# Patient Record
Sex: Female | Born: 1970 | Hispanic: Yes | Marital: Married | Smoking: Former smoker
Health system: Southern US, Community
[De-identification: ages and names within clinical notes are randomized; demographics above are authoritative.]

## PROBLEM LIST (undated history)

## (undated) DIAGNOSIS — C439 Malignant melanoma of skin, unspecified: Secondary | ICD-10-CM

## (undated) DIAGNOSIS — R51 Headache: Secondary | ICD-10-CM

## (undated) DIAGNOSIS — Z8619 Personal history of other infectious and parasitic diseases: Secondary | ICD-10-CM

## (undated) DIAGNOSIS — J301 Allergic rhinitis due to pollen: Secondary | ICD-10-CM

## (undated) DIAGNOSIS — N83209 Unspecified ovarian cyst, unspecified side: Secondary | ICD-10-CM

## (undated) DIAGNOSIS — R519 Headache, unspecified: Secondary | ICD-10-CM

## (undated) DIAGNOSIS — Z973 Presence of spectacles and contact lenses: Secondary | ICD-10-CM

## (undated) DIAGNOSIS — R011 Cardiac murmur, unspecified: Secondary | ICD-10-CM

## (undated) DIAGNOSIS — N9089 Other specified noninflammatory disorders of vulva and perineum: Secondary | ICD-10-CM

## (undated) DIAGNOSIS — D071 Carcinoma in situ of vulva: Secondary | ICD-10-CM

## (undated) DIAGNOSIS — E7800 Pure hypercholesterolemia, unspecified: Secondary | ICD-10-CM

## (undated) DIAGNOSIS — E079 Disorder of thyroid, unspecified: Secondary | ICD-10-CM

## (undated) DIAGNOSIS — I219 Acute myocardial infarction, unspecified: Secondary | ICD-10-CM

## (undated) DIAGNOSIS — E039 Hypothyroidism, unspecified: Secondary | ICD-10-CM

## (undated) DIAGNOSIS — M5412 Radiculopathy, cervical region: Secondary | ICD-10-CM

## (undated) DIAGNOSIS — Z720 Tobacco use: Secondary | ICD-10-CM

## (undated) HISTORY — PX: VULVAR LESION REMOVAL: SHX5391

## (undated) HISTORY — DX: Allergic rhinitis due to pollen: J30.1

## (undated) HISTORY — DX: Unspecified ovarian cyst, unspecified side: N83.209

## (undated) HISTORY — DX: Hypothyroidism, unspecified: E03.9

## (undated) HISTORY — PX: TUBAL LIGATION: SHX77

## (undated) HISTORY — PX: BREAST BIOPSY: SHX20

## (undated) HISTORY — DX: Carcinoma in situ of vulva: D07.1

## (undated) HISTORY — DX: Cardiac murmur, unspecified: R01.1

## (undated) HISTORY — DX: Tobacco use: Z72.0

## (undated) HISTORY — DX: Pure hypercholesterolemia, unspecified: E78.00

## (undated) HISTORY — DX: Other specified noninflammatory disorders of vulva and perineum: N90.89

## (undated) HISTORY — DX: Disorder of thyroid, unspecified: E07.9

## (undated) HISTORY — DX: Acute myocardial infarction, unspecified: I21.9

## (undated) HISTORY — DX: Personal history of other infectious and parasitic diseases: Z86.19

---

## 1995-08-08 DIAGNOSIS — I219 Acute myocardial infarction, unspecified: Secondary | ICD-10-CM

## 1995-08-08 HISTORY — DX: Acute myocardial infarction, unspecified: I21.9

## 1999-04-02 ENCOUNTER — Emergency Department (HOSPITAL_COMMUNITY): Admission: EM | Admit: 1999-04-02 | Discharge: 1999-04-02 | Payer: Self-pay | Admitting: Emergency Medicine

## 2002-08-07 HISTORY — PX: TUBAL LIGATION: SHX77

## 2008-08-07 DIAGNOSIS — C439 Malignant melanoma of skin, unspecified: Secondary | ICD-10-CM

## 2008-08-07 HISTORY — PX: ANKLE SURGERY: SHX546

## 2008-08-07 HISTORY — DX: Malignant melanoma of skin, unspecified: C43.9

## 2009-08-07 ENCOUNTER — Ambulatory Visit: Payer: Self-pay | Admitting: Gynecologic Oncology

## 2009-08-31 ENCOUNTER — Ambulatory Visit: Payer: Self-pay | Admitting: Gynecologic Oncology

## 2009-09-07 ENCOUNTER — Ambulatory Visit: Payer: Self-pay | Admitting: Gynecologic Oncology

## 2009-09-07 ENCOUNTER — Encounter: Payer: Self-pay | Admitting: Cardiovascular Disease

## 2009-09-07 LAB — CONVERTED CEMR LAB
BUN: 13 mg/dL
Creatinine, Ser: 1.09 mg/dL

## 2009-09-09 ENCOUNTER — Encounter: Payer: Self-pay | Admitting: Cardiovascular Disease

## 2009-09-10 ENCOUNTER — Ambulatory Visit: Payer: Self-pay | Admitting: Cardiovascular Disease

## 2009-09-10 DIAGNOSIS — R9431 Abnormal electrocardiogram [ECG] [EKG]: Secondary | ICD-10-CM

## 2009-09-13 ENCOUNTER — Encounter: Payer: Self-pay | Admitting: Internal Medicine

## 2009-09-14 ENCOUNTER — Ambulatory Visit: Payer: Self-pay | Admitting: Gynecologic Oncology

## 2009-09-21 ENCOUNTER — Ambulatory Visit: Payer: Self-pay | Admitting: Gynecologic Oncology

## 2009-10-05 ENCOUNTER — Ambulatory Visit: Payer: Self-pay | Admitting: Gynecologic Oncology

## 2009-10-12 ENCOUNTER — Ambulatory Visit: Payer: Self-pay | Admitting: Gynecologic Oncology

## 2009-11-05 ENCOUNTER — Ambulatory Visit: Payer: Self-pay | Admitting: Gynecologic Oncology

## 2009-11-30 ENCOUNTER — Ambulatory Visit: Payer: Self-pay | Admitting: Gynecologic Oncology

## 2010-02-08 LAB — HM PAP SMEAR

## 2010-03-07 ENCOUNTER — Ambulatory Visit: Payer: Self-pay | Admitting: Gynecologic Oncology

## 2010-03-29 ENCOUNTER — Ambulatory Visit: Payer: Self-pay | Admitting: Gynecologic Oncology

## 2010-04-04 LAB — PATHOLOGY REPORT

## 2010-04-07 ENCOUNTER — Ambulatory Visit: Payer: Self-pay | Admitting: Gynecologic Oncology

## 2010-04-12 ENCOUNTER — Ambulatory Visit: Payer: Self-pay | Admitting: Gynecologic Oncology

## 2010-05-07 ENCOUNTER — Ambulatory Visit: Payer: Self-pay | Admitting: Gynecologic Oncology

## 2010-05-17 ENCOUNTER — Ambulatory Visit: Payer: Self-pay | Admitting: Radiation Oncology

## 2010-06-07 ENCOUNTER — Ambulatory Visit: Payer: Self-pay | Admitting: Gynecologic Oncology

## 2010-08-02 ENCOUNTER — Ambulatory Visit: Payer: Self-pay | Admitting: Gynecologic Oncology

## 2010-08-07 ENCOUNTER — Ambulatory Visit: Payer: Self-pay | Admitting: Gynecologic Oncology

## 2010-09-06 NOTE — Letter (Signed)
Summary: Clearance Letter  Architectural technologist at [facility] [address]   [place], [place] [address]   Phone: [phone]  Fax: [phone]    September 10, 2009  Re:     Marcia [name] Address:   [address]     [place], [place]  [address] DOB:     1971/04/06 MRN:     [id number]   Dear Marcia [name] and Marcia [name]:  Marcia [name] was seen today for cardiac clearance for her pending surgery.  She is a low risk patient and is cleared from a cardiology standpoint to proceed with her surgery.  Should you have any questions or need additional information, please feel free to contact our office at [phone].   Sincerely,     Dossie Arbour, MD

## 2010-09-06 NOTE — Miscellaneous (Signed)
  Clinical Lists Changes  Observations: Added new observation of EGFR NOT AFA: 60 mL/min/1.38m2 (09/07/2009 16:54) Added new observation of EGFR IF AFA: >60 mL/min/1.9m2 (09/07/2009 16:54) Added new observation of CALCIUM: 9.0 mg/dL (40/98/1191 47:82) Added new observation of CREATININE: 1.09 mg/dL (95/62/1308 65:78) Added new observation of BUN: 13 mg/dL (46/96/2952 84:13) Added new observation of CO2 PLSM/SER: 25 meq/L (09/07/2009 16:54) Added new observation of CL SERUM: 104 meq/L (09/07/2009 16:54) Added new observation of K SERUM: 4.0 meq/L (09/07/2009 16:54) Added new observation of NA: 138 meq/L (09/07/2009 16:54) Added new observation of BG RANDOM: 251 mg/dL (24/40/1027 25:36)

## 2010-09-06 NOTE — Progress Notes (Addendum)
Summary: PHI  PHI   Imported By: Harlon Flor 09/13/2009 09:10:54  _____________________________________________________________________  External Attachments:     1. Type:   Image          Comment:   External Document    2. Type:   Image          Comment:   External Document

## 2010-09-06 NOTE — Assessment & Plan Note (Signed)
Summary: NP6   Visit Type:  New patient Primary Provider:  Dr Hershal Coria  CC:  no complaints.  History of Present Illness: [name] is a very pleasant 40 year old woman with a history of diabetes, on insulin pump, hypothyroidism, hyperlipidemia, with smoking history over the past 20 years who presents for preoperative evaluation prior to a GYN surgery.  [name] states that she has been feeling well. She is very active, takes care of her six-year-old. She is able to run, on stairs without any significant symptoms of shortness of breath or chest pain. She presented for preoperative evaluation and anesthesia noted that her EKG was abnormal. She was referred here for further evaluation. Otherwise she states that she is doing very well and eager to have the surgery done next week.  She has been trying to quit smoking and is cut way back than what she normally smokes. She states that her diabetes has been well-controlled.  Preventive Screening-Counseling & Management  Alcohol-Tobacco     Alcohol drinks/day: 0     Smoking Status: current     Packs/Day: 0.5     Pack years: 22  Caffeine-Diet-Exercise     Caffeine use/day: none     Diet Comments: low carb diet     Does Patient Exercise: no      Drug Use:  no.    Current Medications (verified): 1)  Humalog Insulin Pump 2)  Wellbutrin Xl 150 Mg Xr24h-Tab (Bupropion Hcl) .Marland Kitchen.. 1 By Mouth Once Daily 3)  Lamisil 250 Mg Tabs (Terbinafine Hcl) .Marland Kitchen.. 1 Tab By Mouth Once Daily 4)  Levothyroxine Sodium 75 Mcg Tabs (Levothyroxine Sodium) .Marland Kitchen.. 1 By Mouth Once Daily  Allergies (verified): No Known Drug Allergies  Past History:  Family History: Last updated: 09/10/2009 Grandmother: lung and ovarian cancer Aunt: breast cancer Father: murdered  Social History: Last updated: 09/10/2009 Tobacco Use - Yes. 1/2 pack a day Alcohol Use - no Regular Exercise - no Drug Use - no  Risk Factors: Alcohol Use: 0 (09/10/2009) Caffeine Use: none  (09/10/2009) Diet: low carb diet (09/10/2009) Exercise: no (09/10/2009)  Risk Factors: Smoking Status: current (09/10/2009) Packs/Day: 0.5 (09/10/2009)  Past Medical History: vulvar lesions MI at age 89 DM hyperthyroidism hx of HPV infection Heart murmur Heart attack 1997 Allergies/ hay fever Vaginal Cancer Thyroid disease  Past Surgical History: Ankle surgery 2010 Tubial liagation 2004  Family History: Grandmother: lung and ovarian cancer Aunt: breast cancer Father: murdered  Social History: Tobacco Use - Yes. 1/2 pack a day Alcohol Use - no Regular Exercise - no Drug Use - no Packs/Day:  0.5 Pack years:  22 Alcohol drinks/day:  0 Caffeine use/day:  none Diet Comments:  low carb diet Does Patient Exercise:  no Drug Use:  no  Review of Systems  The patient denies anorexia, fever, weight loss, weight gain, vision loss, decreased hearing, hoarseness, chest pain, syncope, dyspnea on exertion, peripheral edema, prolonged cough, headaches, hemoptysis, abdominal pain, melena, hematochezia, severe indigestion/heartburn, hematuria, incontinence, genital sores, muscle weakness, suspicious skin lesions, transient blindness, difficulty walking, depression, unusual weight change, abnormal bleeding, enlarged lymph nodes, angioedema, breast masses, and testicular masses.    Vital Signs:  Patient profile:   40 year old female Height:      62 inches Weight:      145 pounds BMI:     26.62 Pulse rate:   73 / minute Pulse rhythm:   regular BP sitting:   118 / 62  (left arm) Cuff size:   regular  Vitals Entered By: Mercer Pod (September 10, 2009 2:29 PM)  Physical Exam  General:  well-appearing young woman in no apparent distress. Her HEENT exam is benign, oropharynx is clear, neck is supple with no JVP or carotid bruits, heart sounds are regular with S1 and S2 and no murmurs appreciated, lungs are clear to auscultation with no wheezes or rales, abdominal exam is  benign, she has no significant lower extremity edema, neurological exam is grossly nonfocal and skin is warm and dry. Pulses are equal and symmetrical in her upper and lower extremities.   Impression & Recommendations:  Problem # 1:  ABNORMAL EKG (ICD-794.31) EKG done at the outside facility on September 08, 1999 shows normal sinus rhythm with rate of 75 beats per minute, poor R-wave progression through the precordial leads.  EKG done today shows normal sinus rhythm with rate of 73 beats per minute with poor R-wave progression through V1 and V2, improved from the previous EKG. Unable to rule out intraseptal infarct though it is likely a benign finding.  Although the patient does have risk factors including her diabetes, high cholesterol she has no family history and is currently asymptomatic.  Problem # 2:  PRE-OPERATIVE CARDIAC EXAM (ICD-V72.81) she is a young, relatively benign EKG, she can be cleared for surgery with acceptable risk. I have asked her to quit smoking as she is trying to do. She is coming on a cholesterol medication and she states that her diabetes well controlled. Most of her risk factors are otherwise controlled. She does not need any further workup at this time. We will send a preoperative clearance letter to Dr. Wendy Poet. The following medications were removed from the medication list:    Lisinopril 2.5 Mg Tabs (Lisinopril) .Marland Kitchen... Take one tablet by mouth daily

## 2010-10-17 ENCOUNTER — Emergency Department: Payer: Self-pay | Admitting: Emergency Medicine

## 2010-11-01 ENCOUNTER — Ambulatory Visit: Payer: Self-pay | Admitting: Gynecologic Oncology

## 2011-02-21 ENCOUNTER — Encounter: Payer: Self-pay | Admitting: Cardiovascular Disease

## 2011-03-07 ENCOUNTER — Ambulatory Visit: Payer: Self-pay | Admitting: Gynecologic Oncology

## 2011-03-21 ENCOUNTER — Encounter: Payer: Self-pay | Admitting: Cardiovascular Disease

## 2011-03-30 ENCOUNTER — Other Ambulatory Visit: Payer: Self-pay | Admitting: *Deleted

## 2011-03-30 MED ORDER — SIMVASTATIN 80 MG PO TABS: 80.0000 mg | ORAL_TABLET | Freq: Every day | ORAL | Status: DC

## 2011-03-30 MED ORDER — VITAMIN D3 1.25 MG (50000 UT) PO CAPS: 50000.0000 | ORAL_CAPSULE | ORAL | Status: DC

## 2011-04-01 MED ORDER — ALPRAZOLAM 0.5 MG PO TABS: 0.5000 mg | ORAL_TABLET | Freq: Every evening | ORAL | Status: AC | PRN

## 2011-04-01 NOTE — Telephone Encounter (Signed)
Dr. Darrick Huntsman phone in #[phone] with 1 refill on the alprazolam refill request

## 2011-05-16 ENCOUNTER — Ambulatory Visit: Payer: Self-pay | Admitting: Gynecologic Oncology

## 2011-05-22 ENCOUNTER — Encounter: Payer: Self-pay | Admitting: Internal Medicine

## 2011-05-22 ENCOUNTER — Ambulatory Visit (INDEPENDENT_AMBULATORY_CARE_PROVIDER_SITE_OTHER): Payer: Medicaid Other | Admitting: Internal Medicine

## 2011-05-22 DIAGNOSIS — E1065 Type 1 diabetes mellitus with hyperglycemia: Secondary | ICD-10-CM

## 2011-05-22 DIAGNOSIS — F419 Anxiety disorder, unspecified: Secondary | ICD-10-CM

## 2011-05-22 DIAGNOSIS — E039 Hypothyroidism, unspecified: Secondary | ICD-10-CM

## 2011-05-22 DIAGNOSIS — R9431 Abnormal electrocardiogram [ECG] [EKG]: Secondary | ICD-10-CM

## 2011-05-22 DIAGNOSIS — Z720 Tobacco use: Secondary | ICD-10-CM

## 2011-05-22 DIAGNOSIS — Z7189 Other specified counseling: Secondary | ICD-10-CM

## 2011-05-22 DIAGNOSIS — F17200 Nicotine dependence, unspecified, uncomplicated: Secondary | ICD-10-CM

## 2011-05-22 DIAGNOSIS — Z716 Tobacco abuse counseling: Secondary | ICD-10-CM | POA: Insufficient documentation

## 2011-05-22 DIAGNOSIS — F411 Generalized anxiety disorder: Secondary | ICD-10-CM

## 2011-05-22 DIAGNOSIS — Z23 Encounter for immunization: Secondary | ICD-10-CM

## 2011-05-22 DIAGNOSIS — E119 Type 2 diabetes mellitus without complications: Secondary | ICD-10-CM

## 2011-05-22 DIAGNOSIS — IMO0002 Reserved for concepts with insufficient information to code with codable children: Secondary | ICD-10-CM

## 2011-05-22 LAB — COMPREHENSIVE METABOLIC PANEL
ALT: 16 U/L (ref 0–35)
CO2: 25 mEq/L (ref 19–32)
Calcium: 9.1 mg/dL (ref 8.4–10.5)
Chloride: 105 mEq/L (ref 96–112)
GFR: 70.82 mL/min (ref >60.00)
Sodium: 139 mEq/L (ref 135–145)
Total Protein: 7.1 g/dL (ref 6.0–8.3)

## 2011-05-22 LAB — MICROALBUMIN / CREATININE URINE RATIO: Microalb Creat Ratio: 0.6 mg/g (ref 0.0–30.0)

## 2011-05-22 MED ORDER — DIAZEPAM 2 MG PO TABS: 2.0000 mg | ORAL_TABLET | Freq: Two times a day (BID) | ORAL | Status: AC | PRN

## 2011-05-22 NOTE — Patient Instructions (Signed)
We will check your hgba1c , cholesterol and thyroid  level today and call you with the results  Please call i the generic valium does not help your anxiety level

## 2011-05-22 NOTE — Assessment & Plan Note (Signed)
She was counselled on the risks of continued tobacco abuse.

## 2011-05-22 NOTE — Progress Notes (Signed)
  Subjective:    Patient ID: Marcia [name], female    DOB: 10/19/1970, 40 y.o.   MRN: [id number]  HPI  40 yr old female with history of IDDM, vaginal CA returns for followup after making adjustments to insulin rates at last visit due to frequent early am lows.  Having no lows but anxiety level is elevated due to recent recurrence of vaginal CA.       Review of Systems     Objective:   Physical Exam        Assessment & Plan:   Subjective:    Marcia [name] is a 40 y.o. female who presents for a follow-up evaluation of Type 1 diabetes mellitus.  The initial diagnosis of diabetes was made 20 years ago.    Her clinical course has improved. Insulin dosage review with [name] suggested compliance all of the time. Associated symptoms of hyperglycemia have been none.  Associated symptoms of hypoglycemia have been none.   She is currently taking Regular via insulin pump units units pre-breakfast, Regular 1.4 units/hr units units pre-lunch,  units pre-dinner, and 1.0  units/hr at bedtime.  Insulin injections are given by patient and insulin pump.   Compliance with blood glucose monitoring: good.  The patient does perform independently. Rotation of sites for injection: abdominal wall Exercise: intermittently  Meal panning: She is using avoidance of concentrated sweets and carbohydrate counting, but is not on a specified limit, being a pump user.       MedicAlert Identification Noted? yes   The following portions of the patient's history were reviewed and updated as appropriate: allergies, current medications, past family history, past medical history, past social history, past surgical history and problem list.  Review of Systems A comprehensive review of systems was negative except for: Behavioral/Psych: positive for anxiety    Objective:    BP 105/68  Pulse 77  Temp(Src) 98.5 F (36.9 C) (Oral)  Resp 16  Ht 5\' 2"  (1.575 m)  Wt 137 lb (62.143 kg)  BMI 25.06 kg/m2   SpO2 97%  LMP 05/12/2011  General appearance:  alert, cooperative and appears stated age  Oropharynx: lips, mucosa, and tongue normal; teeth and gums normal   Eyes:  negative, conjunctivae/corneas clear. PERRL, EOM's intact. Fundi benign.   Ears:  normal TM's and external ear canals both ears  Neck: no adenopathy, no carotid bruit, no JVD, supple, symmetrical, trachea midline and thyroid not enlarged, symmetric, no tenderness/mass/nodules  Thyroid:  no palpable nodule  Lung: clear to auscultation bilaterally  Heart:  regular rate and rhythm, S1, S2 normal, no murmur, click, rub or gallop  Abdomen: soft, non-tender; bowel sounds normal; no masses,  no organomegaly  Extremities: extremities normal, atraumatic, no cyanosis or edema  Skin: warm and dry, no hyperpigmentation, vitiligo, or suspicious lesions  Pulses: 2+ and symmetric  Neuro: normal without focal findings, mental status, speech normal, alert and oriented x3, PERLA and reflexes normal and symmetric   Lab Review Labs on site today:             hemoglobin A1C - pending   Assessment:    Diabetes Mellitus type I, under fair control.    Plan:    1.  RX changes: none 2.  Education:  self-monitoring of blood glucose skills 3.  Compliance at present is estimated to be good. Efforts to improve compliance (if necessary) will be directed at dietary modifications: none. 4.  Follow up: I recommend diabetes care be 3 months.

## 2011-05-24 MED ORDER — LEVOTHYROXINE SODIUM 112 MCG PO TABS: 112.0000 ug | ORAL_TABLET | Freq: Every day | ORAL | Status: DC

## 2011-05-24 NOTE — Progress Notes (Signed)
Addended by: Duncan Dull on: 05/24/2011 08:59 AM   Modules accepted: Orders

## 2011-05-26 ENCOUNTER — Other Ambulatory Visit: Payer: Self-pay | Admitting: Internal Medicine

## 2011-05-27 MED ORDER — LISINOPRIL 2.5 MG PO TABS: 2.5000 mg | ORAL_TABLET | Freq: Every day | ORAL | Status: DC

## 2011-05-27 MED ORDER — VITAMIN D3 1.25 MG (50000 UT) PO CAPS: 50000.0000 | ORAL_CAPSULE | ORAL | Status: DC

## 2011-06-08 ENCOUNTER — Ambulatory Visit: Payer: Self-pay | Admitting: Gynecologic Oncology

## 2011-06-26 ENCOUNTER — Other Ambulatory Visit: Payer: Self-pay | Admitting: Internal Medicine

## 2011-06-26 MED ORDER — DIAZEPAM 2 MG PO TABS: 2.0000 mg | ORAL_TABLET | Freq: Two times a day (BID) | ORAL | Status: DC | PRN

## 2011-06-26 NOTE — Telephone Encounter (Signed)
Refill for valium authorized.  pls phone in

## 2011-06-27 MED ORDER — DIAZEPAM 2 MG PO TABS: 2.0000 mg | ORAL_TABLET | Freq: Two times a day (BID) | ORAL | Status: DC | PRN

## 2011-08-24 ENCOUNTER — Ambulatory Visit: Payer: Medicaid Other | Admitting: Internal Medicine

## 2011-08-24 DIAGNOSIS — Z0289 Encounter for other administrative examinations: Secondary | ICD-10-CM

## 2011-09-11 ENCOUNTER — Other Ambulatory Visit: Payer: Self-pay | Admitting: *Deleted

## 2011-09-11 NOTE — Telephone Encounter (Signed)
Faxed request from [facility], last filled date not given.

## 2011-09-12 ENCOUNTER — Other Ambulatory Visit: Payer: Self-pay | Admitting: *Deleted

## 2011-09-12 MED ORDER — DIAZEPAM 2 MG PO TABS: 2.0000 mg | ORAL_TABLET | Freq: Two times a day (BID) | ORAL | Status: DC | PRN

## 2011-09-12 MED ORDER — VITAMIN D3 1.25 MG (50000 UT) PO CAPS: 50000.0000 | ORAL_CAPSULE | ORAL | Status: DC

## 2011-09-12 NOTE — Telephone Encounter (Signed)
Vitamin D last filled 05/26/11.  Pt is also requesting a refill on alprazolam .5 mg, take one by mouth at bedtime as needed.  Last filled 06/23/11 for # 30.  This isnt on med list.

## 2011-09-12 NOTE — Telephone Encounter (Signed)
Called to [facility].

## 2011-09-13 ENCOUNTER — Other Ambulatory Visit: Payer: Self-pay | Admitting: *Deleted

## 2011-09-13 NOTE — Telephone Encounter (Signed)
Ok to refill the alprazolam . It was prescribed ealier in the year.  #30 1 refill

## 2011-09-13 NOTE — Telephone Encounter (Signed)
Faxed refill request from [facility] for alprazolam 0.5 mg's, one by mouth at bedtime as needed for anxiety.  No last filled date given.  This isnt on med list.

## 2011-09-13 NOTE — Telephone Encounter (Signed)
Pt is also requesting alprazolam.

## 2011-09-14 MED ORDER — ALPRAZOLAM 0.5 MG PO TBDP: 0.5000 mg | ORAL_TABLET | Freq: Every evening | ORAL | Status: AC | PRN

## 2011-09-14 NOTE — Telephone Encounter (Signed)
Called to [facility], med list updated.   Also, pharmacist questioned pt's 80 mg simvastatin dose.  Per Dr. Darrick Huntsman that should be 40 mg's.  Changed at pharmacy and on med list.

## 2011-09-18 ENCOUNTER — Ambulatory Visit: Payer: Medicaid Other | Admitting: Internal Medicine

## 2011-09-18 DIAGNOSIS — Z0289 Encounter for other administrative examinations: Secondary | ICD-10-CM

## 2011-10-26 ENCOUNTER — Telehealth: Payer: Self-pay | Admitting: Internal Medicine

## 2011-10-26 DIAGNOSIS — E1065 Type 1 diabetes mellitus with hyperglycemia: Secondary | ICD-10-CM

## 2011-10-26 DIAGNOSIS — E039 Hypothyroidism, unspecified: Secondary | ICD-10-CM

## 2011-10-26 NOTE — Telephone Encounter (Signed)
Please let [name] know that I cannot fill out the request for a new insulin pump until she is seen.   She is overdue for her 3 month checkup by 2 months.  She will need  Several labs as well prior to visit. :hgba1c , CMET, and a C peptide, WHICH  i HAVE ORDERED.  thanks

## 2011-10-27 NOTE — Telephone Encounter (Signed)
Left message asking patient to return my call.

## 2011-11-01 NOTE — Telephone Encounter (Signed)
Left another message asking patient to return my call.  Her mother in law stated she is out of town until Monday but will get her to call the office when she returns.

## 2011-11-06 NOTE — Telephone Encounter (Signed)
Patient notified, she made an appt for this week.

## 2011-11-07 ENCOUNTER — Other Ambulatory Visit (INDEPENDENT_AMBULATORY_CARE_PROVIDER_SITE_OTHER): Payer: Medicaid Other | Admitting: *Deleted

## 2011-11-07 DIAGNOSIS — E109 Type 1 diabetes mellitus without complications: Secondary | ICD-10-CM

## 2011-11-07 DIAGNOSIS — E1065 Type 1 diabetes mellitus with hyperglycemia: Secondary | ICD-10-CM

## 2011-11-07 DIAGNOSIS — E039 Hypothyroidism, unspecified: Secondary | ICD-10-CM

## 2011-11-07 LAB — TSH: TSH: 0.55 u[IU]/mL (ref 0.35–5.50)

## 2011-11-07 LAB — MICROALBUMIN / CREATININE URINE RATIO: Microalb Creat Ratio: 0.4 mg/g (ref 0.0–30.0)

## 2011-11-07 LAB — COMPLETE METABOLIC PANEL WITH GFR
ALT: 49 U/L — ABNORMAL HIGH (ref 0–35)
AST: 43 U/L — ABNORMAL HIGH (ref 0–37)
Albumin: 4.2 g/dL (ref 3.5–5.2)
CO2: 23 mEq/L (ref 19–32)
Calcium: 9.2 mg/dL (ref 8.4–10.5)
Chloride: 103 mEq/L (ref 96–112)
Creat: 0.8 mg/dL (ref 0.50–1.10)
GFR, Est African American: >89 mL/min
Potassium: 4.4 mEq/L (ref 3.5–5.3)
Total Protein: 7.2 g/dL (ref 6.0–8.3)

## 2011-11-08 ENCOUNTER — Encounter: Payer: Self-pay | Admitting: Internal Medicine

## 2011-11-08 ENCOUNTER — Ambulatory Visit (INDEPENDENT_AMBULATORY_CARE_PROVIDER_SITE_OTHER): Payer: Medicaid Other | Admitting: Internal Medicine

## 2011-11-08 DIAGNOSIS — N926 Irregular menstruation, unspecified: Secondary | ICD-10-CM

## 2011-11-08 DIAGNOSIS — E1065 Type 1 diabetes mellitus with hyperglycemia: Secondary | ICD-10-CM

## 2011-11-08 LAB — LUTEINIZING HORMONE: LH: 5.9 m[IU]/mL

## 2011-11-08 LAB — CBC WITH DIFFERENTIAL/PLATELET
Basophils Absolute: 0 10*3/uL (ref 0.0–0.1)
Eosinophils Relative: 0.3 % (ref 0.0–5.0)
HCT: 43 % (ref 36.0–46.0)
Hemoglobin: 14.3 g/dL (ref 12.0–15.0)
Lymphocytes Relative: 16 % (ref 12.0–46.0)
Lymphs Abs: 1.8 10*3/uL (ref 0.7–4.0)
Monocytes Relative: 6.7 % (ref 3.0–12.0)
Neutro Abs: 8.8 10*3/uL — ABNORMAL HIGH (ref 1.4–7.7)
RBC: 4.65 Mil/uL (ref 3.87–5.11)
RDW: 14.2 % (ref 11.5–14.6)
WBC: 11.5 10*3/uL — ABNORMAL HIGH (ref 4.5–10.5)

## 2011-11-08 LAB — C-PEPTIDE: C-Peptide: <0.1 ng/mL — ABNORMAL LOW (ref 0.80–3.90)

## 2011-11-08 LAB — FOLLICLE STIMULATING HORMONE: FSH: 6.1 m[IU]/mL

## 2011-11-08 MED ORDER — VARENICLINE TARTRATE 0.5 MG PO TABS: 0.5000 mg | ORAL_TABLET | Freq: Two times a day (BID) | ORAL | Status: DC

## 2011-11-08 NOTE — Progress Notes (Signed)
Patient ID: Marcia [name], female   DOB: 1970-10-18, 41 y.o.   MRN: [id number] Patient Active Problem List  Diagnoses  . ABNORMAL EKG  . Diabetes mellitus type 1, uncontrolled, insulin dependent  . Tobacco abuse  . Tobacco abuse counseling  . Irregular menstrual bleeding    Subjective:  CC:   Chief Complaint  Patient presents with  . Diabetes    follow up, form to be filled out    HPI:   Marcia Maldonadois a 41 y.o. female who presents  Follow up on type 1 DM controlled with insulin pump.  hgba 1c has improved from 10.7 to 9.7 by recent labs. Sugars running 120- to 140 fasting  Post lunch 1 hr 220's  evenings 1 hr are 100 to 160.  Uses sliding scale and carb counting to determine amount of bolus pre meal.  Only 1 hypoglycemicn epsidoes in the last 3 months,  57,  Occurred in the early am .    New issue is increased menstruation occurring every 14 to 18 days,  With a normal  menstrual bleed for 4 to 5 days,  Heavy on the first 2 days,  Then tapers off.  No bad cramps.  Has been going on for 3 months,  Since January.   Past Medical History  Diagnosis Date  . Vulvar lesion   . Myocardial infarction 1997    At age 24  . Diabetes mellitus   . History of HPV infection   . Heart murmur   . Hay fever     Allergies  . Vaginal cancer   . Thyroid disease   . Hypercholesterolemia   . Hypothyroidism   . Tobacco abuse     Past Surgical History  Procedure Date  . Ankle surgery 2010  . Tubal ligation 2004  . Tubal ligation 2004, [facility]         The following portions of the patient's history were reviewed and updated as appropriate: Allergies, current medications, and problem list.    Review of Systems:   12 Pt  review of systems was negative except those addressed in the HPI,     History   Social History  . Marital Status: Divorced    Spouse Name: N/A    Number of Children: N/A  . Years of Education: N/A   Occupational History  . Not on file.   Social  History Main Topics  . Smoking status: Current Everyday Smoker -- 1.0 packs/day for 22 years    Types: Cigarettes  . Smokeless tobacco: Not on file  . Alcohol Use: No  . Drug Use: No  . Sexually Active:    Other Topics Concern  . Not on file   Social History Narrative   No regular exercise.Lives with spouse.    Objective:  BP 110/60  Pulse 90  Temp(Src) 97.9 F (36.6 C) (Oral)  Resp 16  Wt 135 lb 8 oz (61.462 kg)  SpO2 99%  LMP 10/27/2011  General appearance: alert, cooperative and appears stated age Ears: normal TM's and external ear canals both ears Throat: lips, mucosa, and tongue normal; teeth and gums normal Neck: no adenopathy, no carotid bruit, supple, symmetrical, trachea midline and thyroid not enlarged, symmetric, no tenderness/mass/nodules Back: symmetric, no curvature. ROM normal. No CVA tenderness. Lungs: clear to auscultation bilaterally Heart: regular rate and rhythm, S1, S2 normal, no murmur, click, rub or gallop Abdomen: soft, non-tender; bowel sounds normal; no masses,  no organomegaly Pulses: 2+ and symmetric Skin: Skin color,  texture, turgor normal. No rashes or lesions Lymph nodes: Cervical, supraclavicular, and axillary nodes normal.  Assessment and Plan:  Diabetes mellitus type 1, uncontrolled, insulin dependent Not well controlled despite use of insulin pump and boluses.  Her post prandials are elevated. She is counting carbs and using a sliding scale for bolus.  Will have her add 3 units to each pre meal bolus  Irregular menstrual bleeding New onset. wil reduce her thyroid dose she her TSH was 0.55 .  She is not perimenopausal by today's labs.  If no imporvement in 6 weeks will refer to GYN for ultrasound of uterus.  She is not a candidate oro OCPs bc of her continued tobacco abuse.      Updated Medication List Outpatient Encounter Prescriptions as of 11/08/2011  Medication Sig Dispense Refill  . Cholecalciferol (VITAMIN D3) 50000 UNITS CAPS  Take 50,000 capsules by mouth once a week.  4 capsule  2  . diazepam (VALIUM) 2 MG tablet Take 1 tablet (2 mg total) by mouth every 12 (twelve) hours as needed for anxiety.  60 tablet  3  . diclofenac (VOLTAREN) 75 MG EC tablet Take 75 mg by mouth 2 (two) times daily.        . insulin lispro (HUMALOG) 100 UNIT/ML injection Inject into the skin daily. 1 subcutaneous.  Used as ordered for Insulin pump.       Marland Kitchen levothyroxine (SYNTHROID, LEVOTHROID) 112 MCG tablet Take 1 tablet (112 mcg total) by mouth daily.  30 tablet  5  . lisinopril (PRINIVIL,ZESTRIL) 2.5 MG tablet Take 1 tablet (2.5 mg total) by mouth daily.  90 tablet  3  . NON FORMULARY Humalog Insulin Pump.       . simvastatin (ZOCOR) 40 MG tablet Take 40 mg by mouth every evening.      . varenicline (CHANTIX) 0.5 MG tablet Take 1 tablet (0.5 mg total) by mouth 2 (two) times daily.  60 tablet  0  . DISCONTD: buPROPion (WELLBUTRIN XL) 150 MG 24 hr tablet Take 150 mg by mouth 2 times daily at 12 noon and 4 pm.          Orders Placed This Encounter  Procedures  . Follicle stimulating hormone  . LH  . CBC with Differential  . INR/PT    No Follow-up on file.

## 2011-11-08 NOTE — Assessment & Plan Note (Addendum)
Not well controlled despite use of insulin pump and boluses.  Her post prandials are elevated. She is counting carbs and using a sliding scale for bolus.  Will have her add 3 units to each pre meal bolus

## 2011-11-08 NOTE — Patient Instructions (Addendum)
Reduce the thyroid medication to 1/2 tablet twice  Awake, continue whole tablet 5 days a week  We will check your [facility] and LH today .    If no improvement in one month,  We will have Dr. Rondel Baton opinion   Add 3 units to your bolus injection before lunch.  Goal is a post prandial of < 200

## 2011-11-08 NOTE — Assessment & Plan Note (Signed)
New onset. wil reduce her thyroid dose she her TSH was 0.55 .  She is not perimenopausal by today's labs.  If no imporvement in 6 weeks will refer to GYN for ultrasound of uterus.  She is not a candidate oro OCPs bc of her continued tobacco abuse.

## 2011-11-26 ENCOUNTER — Emergency Department: Payer: Self-pay | Admitting: Emergency Medicine

## 2011-12-08 ENCOUNTER — Ambulatory Visit (INDEPENDENT_AMBULATORY_CARE_PROVIDER_SITE_OTHER): Payer: Medicaid Other | Admitting: Internal Medicine

## 2011-12-08 ENCOUNTER — Encounter: Payer: Self-pay | Admitting: Internal Medicine

## 2011-12-08 VITALS — BP 102/60 | HR 84 | Temp 97.9°F | Resp 16 | Wt 133.8 lb

## 2011-12-08 DIAGNOSIS — Z7189 Other specified counseling: Secondary | ICD-10-CM

## 2011-12-08 DIAGNOSIS — E1065 Type 1 diabetes mellitus with hyperglycemia: Secondary | ICD-10-CM

## 2011-12-08 DIAGNOSIS — Z716 Tobacco abuse counseling: Secondary | ICD-10-CM

## 2011-12-08 DIAGNOSIS — N926 Irregular menstruation, unspecified: Secondary | ICD-10-CM

## 2011-12-08 MED ORDER — ALPRAZOLAM 0.5 MG PO TABS: 0.5000 mg | ORAL_TABLET | Freq: Two times a day (BID) | ORAL | Status: AC | PRN

## 2011-12-08 MED ORDER — CITALOPRAM HYDROBROMIDE 10 MG PO TABS: 10.0000 mg | ORAL_TABLET | Freq: Every day | ORAL | Status: DC

## 2011-12-08 NOTE — Progress Notes (Signed)
Patient ID: Marcia [name], female   DOB: Jan 31, 1971, 41 y.o.   MRN: [id number]  Patient Active Problem List  Diagnoses  . ABNORMAL EKG  . Diabetes mellitus type 1, uncontrolled, insulin dependent  . Tobacco abuse  . Tobacco abuse counseling  . Irregular menstrual bleeding    Subjective:  CC:   Chief Complaint  Patient presents with  . Follow-up    HPI:   Marcia [name] a 41 y.o. female who presents for follow up on tobacco cessation and other chronic issues.  She started the Chantix after last visit and has been smoke free for 3 weeks.  She has noted some increased mood lability, with frequent crying, which has been occurring daily. No rage or manic symptoms.  She is requsting a refill on alprazolam for prn use.  Her menstrual periods are occurring every 21 days for the past 2 months since we  reduced thyroid dose by suspending one weekend dose.  Her blood sugars have been better regulated since she started using her new insulin pump.  No lows .   Past Medical History  Diagnosis Date  . Vulvar lesion   . Myocardial infarction 1997    At age 39  . Diabetes mellitus   . History of HPV infection   . Heart murmur   . Hay fever     Allergies  . Vaginal cancer   . Thyroid disease   . Hypercholesterolemia   . Hypothyroidism   . Tobacco abuse     Past Surgical History  Procedure Date  . Ankle surgery 2010  . Tubal ligation 2004  . Tubal ligation 2004, [facility]         The following portions of the patient's history were reviewed and updated as appropriate: Allergies, current medications, and problem list.    Review of Systems:   12 Pt  review of systems was negative except those addressed in the HPI,     History   Social History  . Marital Status: Divorced    Spouse Name: N/A    Number of Children: N/A  . Years of Education: N/A   Occupational History  . Not on file.   Social History Main Topics  . Smoking status: Current Everyday Smoker -- 22  years    Types: Cigarettes  . Smokeless tobacco: Never Used  . Alcohol Use: No  . Drug Use: No  . Sexually Active: Not on file   Other Topics Concern  . Not on file   Social History Narrative   No regular exercise.Lives with spouse.    Objective:  BP 102/60  Pulse 84  Temp(Src) 97.9 F (36.6 C) (Oral)  Resp 16  Wt 133 lb 12 oz (60.669 kg)  SpO2 100%  LMP 10/19/2011  General appearance: alert, cooperative and appears stated age Ears: normal TM's and external ear canals both ears Throat: lips, mucosa, and tongue normal; teeth and gums normal Neck: no adenopathy, no carotid bruit, supple, symmetrical, trachea midline and thyroid not enlarged, symmetric, no tenderness/mass/nodules Back: symmetric, no curvature. ROM normal. No CVA tenderness. Lungs: clear to auscultation bilaterally Heart: regular rate and rhythm, S1, S2 normal, no murmur, click, rub or gallop Abdomen: soft, non-tender; bowel sounds normal; no masses,  no organomegaly Pulses: 2+ and symmetric Skin: Skin color, texture, turgor normal. No rashes or lesions Lymph nodes: Cervical, supraclavicular, and axillary nodes normal.  Assessment and Plan:  Diabetes mellitus type 1, uncontrolled, insulin dependent On inusulin pump  7 AM 1.4 units/hr  12 PM  1.4 units   9 PM  1 unit/hr  MN  1 AM   3 AM  1 UNIT.  Lows have resolved and sugars have been better   Irregular menstrual bleeding Improved, now every 21 days, lasting 4 days with lowering of levothyroxine dose. Continue current dose of synthroid.    Tobacco abuse counseling Encouragement given.  She has bee quit now for 3 weeks using chantix.     Updated Medication List Outpatient Encounter Prescriptions as of 12/08/2011  Medication Sig Dispense Refill  . Cholecalciferol (VITAMIN D3) 50000 UNITS CAPS Take 50,000 capsules by mouth once a week.  4 capsule  2  . diazepam (VALIUM) 2 MG tablet Take 1 tablet (2 mg total) by mouth every 12 (twelve) hours as needed  for anxiety.  60 tablet  3  . diclofenac (VOLTAREN) 75 MG EC tablet Take 75 mg by mouth 2 (two) times daily.        . insulin lispro (HUMALOG) 100 UNIT/ML injection Inject into the skin daily. 1 subcutaneous.  Used as ordered for Insulin pump.       Marland Kitchen levothyroxine (SYNTHROID, LEVOTHROID) 112 MCG tablet Take 1 tablet (112 mcg total) by mouth daily.  30 tablet  5  . lisinopril (PRINIVIL,ZESTRIL) 2.5 MG tablet Take 1 tablet (2.5 mg total) by mouth daily.  90 tablet  3  . NON FORMULARY Humalog Insulin Pump.       . simvastatin (ZOCOR) 40 MG tablet Take 40 mg by mouth every evening.      Marland Kitchen DISCONTD: varenicline (CHANTIX) 0.5 MG tablet Take 1 tablet (0.5 mg total) by mouth 2 (two) times daily.  60 tablet  0  . ALPRAZolam (XANAX) 0.5 MG tablet Take 1 tablet (0.5 mg total) by mouth 2 (two) times daily as needed for anxiety.  60 tablet  2  . citalopram (CELEXA) 10 MG tablet Take 1 tablet (10 mg total) by mouth daily.  30 tablet  2  . varenicline (CHANTIX CONTINUING MONTH PAK) 1 MG tablet Take 1 tablet (1 mg total) by mouth 2 (two) times daily.  60 tablet  6     No orders of the defined types were placed in this encounter.    No Follow-up on file.

## 2011-12-10 ENCOUNTER — Encounter: Payer: Self-pay | Admitting: Internal Medicine

## 2011-12-10 MED ORDER — VARENICLINE TARTRATE 1 MG PO TABS: 1.0000 mg | ORAL_TABLET | Freq: Two times a day (BID) | ORAL | Status: AC

## 2011-12-10 NOTE — Assessment & Plan Note (Signed)
Improved, now every 21 days, lasting 4 days with lowering of levothyroxine dose. Continue current dose of synthroid.

## 2011-12-10 NOTE — Assessment & Plan Note (Signed)
Encouragement given.  She has bee quit now for 3 weeks using chantix.

## 2011-12-10 NOTE — Assessment & Plan Note (Signed)
On inusulin pump  7 AM 1.4 units/hr     12 PM  1.4 units   9 PM  1 unit/hr  MN  1 AM   3 AM  1 UNIT.  Lows have resolved and sugars have been better

## 2011-12-26 ENCOUNTER — Other Ambulatory Visit: Payer: Self-pay | Admitting: Internal Medicine

## 2012-01-02 ENCOUNTER — Other Ambulatory Visit: Payer: Self-pay | Admitting: Internal Medicine

## 2012-02-09 ENCOUNTER — Encounter: Payer: Self-pay | Admitting: Internal Medicine

## 2012-02-09 ENCOUNTER — Ambulatory Visit (INDEPENDENT_AMBULATORY_CARE_PROVIDER_SITE_OTHER): Payer: Medicaid Other | Admitting: Internal Medicine

## 2012-02-09 VITALS — BP 112/60 | HR 79 | Temp 97.9°F | Resp 16 | Wt 130.0 lb

## 2012-02-09 DIAGNOSIS — F17200 Nicotine dependence, unspecified, uncomplicated: Secondary | ICD-10-CM

## 2012-02-09 DIAGNOSIS — E109 Type 1 diabetes mellitus without complications: Secondary | ICD-10-CM

## 2012-02-09 DIAGNOSIS — E1065 Type 1 diabetes mellitus with hyperglycemia: Secondary | ICD-10-CM

## 2012-02-09 DIAGNOSIS — Z720 Tobacco use: Secondary | ICD-10-CM

## 2012-02-09 DIAGNOSIS — L02219 Cutaneous abscess of trunk, unspecified: Secondary | ICD-10-CM

## 2012-02-09 DIAGNOSIS — E039 Hypothyroidism, unspecified: Secondary | ICD-10-CM

## 2012-02-09 LAB — LIPID PANEL
Cholesterol: 208 mg/dL — ABNORMAL HIGH (ref 0–200)
HDL: 67.7 mg/dL (ref >39.00)

## 2012-02-09 LAB — HEMOGLOBIN A1C: Hgb A1c MFr Bld: 10 % — ABNORMAL HIGH (ref 4.6–6.5)

## 2012-02-09 MED ORDER — SULFAMETHOXAZOLE-TRIMETHOPRIM 800-160 MG PO TABS: 1.0000 | ORAL_TABLET | Freq: Two times a day (BID) | ORAL | Status: AC

## 2012-02-09 NOTE — Patient Instructions (Addendum)
I have called in Septra,  A sulf antibiotic to treat the possible infection you have on your belly.  Apply warm compresses to the bump to encourage it to drain,  Do not squeeze it.   If you start having fevers or if the redness spreads over the weekend ,  Go to the ER  Return in 3 months for your annual GYN exam.

## 2012-02-09 NOTE — Progress Notes (Signed)
Patient ID: Marcia [name], female   DOB: April 11, 1971, 41 y.o.   MRN: [id number] Patient Active Problem List  Diagnosis  . ABNORMAL EKG  . Diabetes mellitus type 1, uncontrolled, insulin dependent  . Tobacco abuse  . Tobacco abuse counseling  . Irregular menstrual bleeding  . Cellulitis and abscess of trunk    Subjective:  CC:   Chief Complaint  Patient presents with  . Follow-up    2 month    HPI:   Marcia Maldonadois a 41 y.o. female who presentsFollow up on Type 1 diabetes,  Uncontrolled, managed with insulin pump. She has noted improved  glycemic control since getting a new pump which enables her to download information to a software program.  She has had no lows.  Blood sugars have all been less than 1802nd issue is a dime sized subcutaneus knot on her abdominal wall at the pump site.  Knot is slightly tender and surrounding skin is red.     Past Medical History  Diagnosis Date  . Vulvar lesion   . Myocardial infarction 1997    At age 59  . Diabetes mellitus   . History of HPV infection   . Heart murmur   . Hay fever     Allergies  . Vaginal cancer   . Thyroid disease   . Hypercholesterolemia   . Hypothyroidism   . Tobacco abuse     Past Surgical History  Procedure Date  . Ankle surgery 2010  . Tubal ligation 2004  . Tubal ligation 2004, [facility]         The following portions of the patient's history were reviewed and updated as appropriate: Allergies, current medications, and problem list.    Review of Systems:  Comprehenive review of systems was negative except those addressed in the HPI,     History   Social History  . Marital Status: Divorced    Spouse Name: Marcia [name]    Number of Children: Marcia [name]  . Years of Education: Marcia [name]   Occupational History  . Not on file.   Social History Main Topics  . Smoking status: Former Smoker -- 22 years    Types: Cigarettes  . Smokeless tobacco: Never Used  . Alcohol Use: No  . Drug Use: No  . Sexually  Active: Not on file   Other Topics Concern  . Not on file   Social History Narrative   No regular exercise.Lives with spouse.    Objective:  BP 112/60  Pulse 79  Temp 97.9 F (36.6 C) (Oral)  Resp 16  Wt 130 lb (58.968 kg)  SpO2 97%  LMP 01/24/2012  General appearance: alert, cooperative and appears stated age Ears: normal TM's and external ear canals both ears Throat: lips, mucosa, and tongue normal; teeth and gums normal Neck: no adenopathy, no carotid bruit, supple, symmetrical, trachea midline and thyroid not enlarged, symmetric, no tenderness/mass/nodules Back: symmetric, no curvature. ROM normal. No CVA tenderness. Lungs: clear to auscultation bilaterally Heart: regular rate and rhythm, S1, S2 normal, no murmur, click, rub or gallop Abdomen: soft, non-tender; bowel sounds normal; no masses,  no organomegaly Pulses: 2+ and symmetric Skin:  Small raised nontender subcutaneous nodule, nonfluctuant. Mild erythema surrounding nodule.  Lymph nodes: Cervical, supraclavicular, and axillary nodes normal.  Assessment and Plan:  Tobacco abuse She has been tobacco free using Chant ix as of last visit. Encouragement given.   Diabetes mellitus type 1, uncontrolled, insulin dependent Her hga1c remains quite high at 10.0 despite replacement of insulin  pump .  Wil refer to Dr. Tedd Sias for evaluation  And management. Continue statin, and ACE Inhibitor as well as baby aspirin daily.   Cellulitis and abscess of trunk treating empirically for MRSA cellulitis/furuncle with Septra. Patient instructed to go to ER if infection progresses.    Updated Medication List Outpatient Encounter Prescriptions as of 02/09/2012  Medication Sig Dispense Refill  . Cholecalciferol (VITAMIN D3) 50000 UNITS CAPS Take 50,000 capsules by mouth once a week.  4 capsule  2  . citalopram (CELEXA) 10 MG tablet Take 1 tablet (10 mg total) by mouth daily.  30 tablet  2  . diazepam (VALIUM) 2 MG tablet Take 1 tablet  (2 mg total) by mouth every 12 (twelve) hours as needed for anxiety.  60 tablet  3  . diclofenac (VOLTAREN) 75 MG EC tablet Take 75 mg by mouth 2 (two) times daily.        . insulin lispro (HUMALOG) 100 UNIT/ML injection Inject into the skin daily. 1 subcutaneous.  Used as ordered for Insulin pump.       Marland Kitchen levothyroxine (SYNTHROID, LEVOTHROID) 112 MCG tablet Take 1 tablet (112 mcg total) by mouth daily.  30 tablet  5  . lisinopril (PRINIVIL,ZESTRIL) 2.5 MG tablet Take 1 tablet (2.5 mg total) by mouth daily.  90 tablet  3  . NON FORMULARY Humalog Insulin Pump.       . simvastatin (ZOCOR) 40 MG tablet Take 40 mg by mouth every evening.      . varenicline (CHANTIX CONTINUING MONTH PAK) 1 MG tablet Take 1 tablet (1 mg total) by mouth 2 (two) times daily.  60 tablet  6  . sulfamethoxazole-trimethoprim (BACTRIM DS,SEPTRA DS) 800-160 MG per tablet Take 1 tablet by mouth 2 (two) times daily.  14 tablet  0     Orders Placed This Encounter  Procedures  . HM PAP SMEAR  . Hemoglobin A1c  . Lipid panel  . COMPLETE METABOLIC PANEL WITH GFR  . TSH  . LDL cholesterol, direct  . Ambulatory referral to Endocrinology    Return in about 3 months (around 05/11/2012).

## 2012-02-10 ENCOUNTER — Encounter: Payer: Self-pay | Admitting: Internal Medicine

## 2012-02-10 DIAGNOSIS — L02219 Cutaneous abscess of trunk, unspecified: Secondary | ICD-10-CM | POA: Insufficient documentation

## 2012-02-10 NOTE — Assessment & Plan Note (Signed)
treating empirically for MRSA cellulitis/furuncle with Septra. Patient instructed to go to ER if infection progresses.

## 2012-02-10 NOTE — Assessment & Plan Note (Signed)
She has been tobacco free using Chant ix as of last visit. Encouragement given.

## 2012-02-10 NOTE — Assessment & Plan Note (Addendum)
Her hga1c remains quite high at 10.0 despite replacement of insulin pump .  Wil refer to Dr. Tedd Sias for evaluation  And management. Continue statin, and ACE Inhibitor as well as baby aspirin daily.

## 2012-03-04 ENCOUNTER — Encounter: Payer: Self-pay | Admitting: Internal Medicine

## 2012-03-04 DIAGNOSIS — E1065 Type 1 diabetes mellitus with hyperglycemia: Secondary | ICD-10-CM

## 2012-03-12 ENCOUNTER — Encounter: Payer: Self-pay | Admitting: Internal Medicine

## 2012-03-21 ENCOUNTER — Other Ambulatory Visit: Payer: Self-pay | Admitting: Internal Medicine

## 2012-03-22 ENCOUNTER — Other Ambulatory Visit: Payer: Self-pay | Admitting: *Deleted

## 2012-03-22 DIAGNOSIS — E039 Hypothyroidism, unspecified: Secondary | ICD-10-CM

## 2012-03-22 MED ORDER — DIAZEPAM 2 MG PO TABS: 2.0000 mg | ORAL_TABLET | Freq: Two times a day (BID) | ORAL | Status: DC | PRN

## 2012-03-22 MED ORDER — LEVOTHYROXINE SODIUM 112 MCG PO TABS: 112.0000 ug | ORAL_TABLET | Freq: Every day | ORAL | Status: DC

## 2012-03-27 ENCOUNTER — Ambulatory Visit: Payer: Medicaid Other | Admitting: Endocrinology

## 2012-03-27 DIAGNOSIS — Z0289 Encounter for other administrative examinations: Secondary | ICD-10-CM

## 2012-05-01 ENCOUNTER — Telehealth: Payer: Self-pay | Admitting: Internal Medicine

## 2012-05-01 DIAGNOSIS — E039 Hypothyroidism, unspecified: Secondary | ICD-10-CM

## 2012-05-01 MED ORDER — INSULIN LISPRO 100 UNIT/ML ~~LOC~~ SOLN: SUBCUTANEOUS | Status: DC

## 2012-05-01 MED ORDER — LEVOTHYROXINE SODIUM 112 MCG PO TABS: 112.0000 ug | ORAL_TABLET | Freq: Every day | ORAL | Status: DC

## 2012-05-01 NOTE — Telephone Encounter (Signed)
Levothyroxine 112 mcg tablet 30 each prescribed refill 5 date written 05-24-11 take 1 tablet by mouth every day. Date last filled 02/09/12 humalog 100 unites/ml vial 30 ml prescribed refills 11 date writtine 04-03-11 use as directed with insulin pump date last filled 03/21/12

## 2012-05-10 ENCOUNTER — Ambulatory Visit: Payer: Medicaid Other | Admitting: Internal Medicine

## 2012-05-10 DIAGNOSIS — Z0289 Encounter for other administrative examinations: Secondary | ICD-10-CM

## 2012-06-10 ENCOUNTER — Ambulatory Visit: Payer: Medicaid Other | Admitting: Endocrinology

## 2012-06-18 ENCOUNTER — Ambulatory Visit (INDEPENDENT_AMBULATORY_CARE_PROVIDER_SITE_OTHER): Payer: Medicaid Other | Admitting: Internal Medicine

## 2012-06-18 ENCOUNTER — Encounter: Payer: Self-pay | Admitting: Internal Medicine

## 2012-06-18 ENCOUNTER — Ambulatory Visit: Payer: Self-pay | Admitting: Gynecologic Oncology

## 2012-06-18 ENCOUNTER — Ambulatory Visit: Payer: Medicaid Other | Admitting: Internal Medicine

## 2012-06-18 ENCOUNTER — Ambulatory Visit: Payer: Self-pay | Admitting: Internal Medicine

## 2012-06-18 VITALS — BP 120/72 | HR 78 | Temp 98.2°F | Resp 12 | Ht 62.0 in | Wt 140.5 lb

## 2012-06-18 DIAGNOSIS — M25531 Pain in right wrist: Secondary | ICD-10-CM

## 2012-06-18 DIAGNOSIS — S5291XA Unspecified fracture of right forearm, initial encounter for closed fracture: Secondary | ICD-10-CM | POA: Insufficient documentation

## 2012-06-18 DIAGNOSIS — M25539 Pain in unspecified wrist: Secondary | ICD-10-CM

## 2012-06-18 MED ORDER — TRAMADOL HCL 50 MG PO TABS: 50.0000 mg | ORAL_TABLET | Freq: Four times a day (QID) | ORAL | Status: DC | PRN

## 2012-06-18 NOTE — Assessment & Plan Note (Addendum)
Plain films were done to rule out fracture.  She was given a Quickfit wrist splint given to wear 24/7.  [facility] followup.  continue diclodenac, ice, and adding tramadol for pain

## 2012-06-18 NOTE — Progress Notes (Signed)
Patient ID: Marcia [name], female   DOB: 1971/06/17, 41 y.o.   MRN: [id number] Patient Active Problem List  Diagnosis  . ABNORMAL EKG  . Diabetes mellitus type 1, uncontrolled, insulin dependent  . Tobacco abuse  . Tobacco abuse counseling  . Irregular menstrual bleeding  . Cellulitis and abscess of trunk  . Sprain of right wrist    Subjective:  CC:   Chief Complaint  Patient presents with  . Wrist Pain    HPI:   Marcia Maldonadois a 41 y.o. female who presents with Right wrist pain and swelling.   Started 2 weeks ago after twisting it while grabbing the banister to avoid falling down a flight of stairs.  She iced her wrist initially for 24 hours.  Wrist felt ok until about  4 days ago when she developed swelling in the distal forearm accompanied by weakness and recurrent dropping things.  The pain is on the lateral side over ulnar head and radiates up and down the arm.  She has been having episodes  of fingertip numbness and tingling.      Past Medical History  Diagnosis Date  . Vulvar lesion   . Myocardial infarction 1997    At age 85  . Diabetes mellitus   . History of HPV infection   . Heart murmur   . Hay fever     Allergies  . Vaginal cancer   . Thyroid disease   . Hypercholesterolemia   . Hypothyroidism   . Tobacco abuse     Past Surgical History  Procedure Date  . Ankle surgery 2010  . Tubal ligation 2004  . Tubal ligation 2004, [facility]         The following portions of the patient's history were reviewed and updated as appropriate: Allergies, current medications, and problem list.    Review of Systems:   12 Pt  review of systems was negative except those addressed in the HPI,     History   Social History  . Marital Status: Divorced    Spouse Name: N/A    Number of Children: N/A  . Years of Education: N/A   Occupational History  . Not on file.   Social History Main Topics  . Smoking status: Former Smoker -- 22 years    Types:  Cigarettes  . Smokeless tobacco: Never Used  . Alcohol Use: No  . Drug Use: No  . Sexually Active: Not on file   Other Topics Concern  . Not on file   Social History Narrative   No regular exercise.Lives with spouse.    Objective:  BP 120/72  Pulse 78  Temp 98.2 F (36.8 C) (Oral)  Resp 12  Ht 5\' 2"  (1.575 m)  Wt 140 lb 8 oz (63.73 kg)  BMI 25.70 kg/m2  SpO2 97%  LMP 06/03/2012  General appearance: alert, cooperative and appears stated age Neck: no adenopathy, no carotid bruit, supple, symmetrical, trachea midline and thyroid not enlarged, symmetric, no tenderness/mass/nodules Back: symmetric, no curvature. ROM normal. No CVA tenderness. Lungs: clear to auscultation bilaterally Heart: regular rate and rhythm, S1, S2 normal, no murmur, click, rub or gallop Abdomen: soft, non-tender; bowel sounds normal; no masses,  no organomegaly Pulses: 2+ and symmetric Skin: Skin color, texture, turgor normal. No rashes or lesions MSK: pain to palpation of ulnar head. Grip strength 4/5  Lymph nodes: Cervical, supraclavicular, and axillary nodes normal.  Assessment and Plan:  Wrist pain, right Plain films were done to rule out fracture.  She was given a Quickfit wrist splint given to wear 24/7.  [facility] followup.  continue diclodenac, ice, and adding tramadol for pain    Updated Medication List Outpatient Encounter Prescriptions as of 06/18/2012  Medication Sig Dispense Refill  . Cholecalciferol (VITAMIN D3) 50000 UNITS CAPS Take 50,000 capsules by mouth once a week.  4 capsule  2  . citalopram (CELEXA) 10 MG tablet TAKE 1 TABLET BY MOUTH EVERY DAY  30 tablet  2  . diazepam (VALIUM) 2 MG tablet Take 1 tablet (2 mg total) by mouth every 12 (twelve) hours as needed for anxiety.  60 tablet  3  . diclofenac (VOLTAREN) 75 MG EC tablet Take 75 mg by mouth 2 (two) times daily.        . insulin lispro (HUMALOG) 100 UNIT/ML injection Used as ordered for Insulin pump.  30 mL   6  . levothyroxine (SYNTHROID, LEVOTHROID) 112 MCG tablet Take 1 tablet (112 mcg total) by mouth daily.  30 tablet  5  . lisinopril (PRINIVIL,ZESTRIL) 2.5 MG tablet Take 1 tablet (2.5 mg total) by mouth daily.  90 tablet  3  . NON FORMULARY Humalog Insulin Pump.       . simvastatin (ZOCOR) 40 MG tablet Take 40 mg by mouth every evening.      . traMADol (ULTRAM) 50 MG tablet Take 1 tablet (50 mg total) by mouth every 6 (six) hours as needed for pain.  120 tablet  1     Orders Placed This Encounter  Procedures  . DG Wrist Complete Right  . Ambulatory referral to Orthopedic Surgery    No Follow-up on file.

## 2012-06-19 ENCOUNTER — Telehealth: Payer: Self-pay | Admitting: Internal Medicine

## 2012-06-19 NOTE — Telephone Encounter (Signed)
Patient notified via phone .

## 2012-06-19 NOTE — Telephone Encounter (Signed)
Her x-rays of the right wrist showed no evidence of fractures or dislocation. I would still like her to see Dr. Martha Clan. If she does not receive notification by the end of the week of an appointment, please call [facility].

## 2012-06-20 ENCOUNTER — Encounter: Payer: Self-pay | Admitting: Internal Medicine

## 2012-06-24 LAB — PATHOLOGY REPORT

## 2012-07-01 ENCOUNTER — Encounter: Payer: Self-pay | Admitting: Internal Medicine

## 2012-07-07 ENCOUNTER — Ambulatory Visit: Payer: Self-pay | Admitting: Gynecologic Oncology

## 2012-07-11 ENCOUNTER — Other Ambulatory Visit: Payer: Self-pay

## 2012-07-11 NOTE — Telephone Encounter (Signed)
Refill request for Xanax 0.5 mg. Ok to refill? 

## 2012-07-12 ENCOUNTER — Other Ambulatory Visit: Payer: Self-pay

## 2012-07-12 MED ORDER — ALPRAZOLAM 0.5 MG PO TABS: 0.5000 mg | ORAL_TABLET | Freq: Every evening | ORAL | Status: DC | PRN

## 2012-07-12 NOTE — Telephone Encounter (Signed)
Rx faxed to [facility]

## 2012-07-15 ENCOUNTER — Ambulatory Visit: Payer: Self-pay | Admitting: Gynecologic Oncology

## 2012-07-15 LAB — BASIC METABOLIC PANEL
Anion Gap: 4 — ABNORMAL LOW (ref 7–16)
BUN: 14 mg/dL (ref 7–18)
Chloride: 106 mmol/L (ref 98–107)
Creatinine: 0.75 mg/dL (ref 0.60–1.30)
EGFR (African American): >60
EGFR (Non-African Amer.): >60
Sodium: 138 mmol/L (ref 136–145)

## 2012-07-15 LAB — CBC
HGB: 14.3 g/dL (ref 12.0–16.0)
MCH: 32 pg (ref 26.0–34.0)
MCHC: 34.5 g/dL (ref 32.0–36.0)
MCV: 93 fL (ref 80–100)
Platelet: 240 10*3/uL (ref 150–440)

## 2012-07-15 LAB — PREGNANCY, URINE: Pregnancy Test, Urine: NEGATIVE m[IU]/mL

## 2012-07-16 ENCOUNTER — Other Ambulatory Visit: Payer: Self-pay

## 2012-07-23 ENCOUNTER — Ambulatory Visit: Payer: Self-pay | Admitting: Gynecologic Oncology

## 2012-08-02 ENCOUNTER — Other Ambulatory Visit: Payer: Self-pay | Admitting: Internal Medicine

## 2012-08-02 DIAGNOSIS — E1065 Type 1 diabetes mellitus with hyperglycemia: Secondary | ICD-10-CM

## 2012-08-02 MED ORDER — ASPIRIN EC 81 MG PO TBEC: 81.0000 mg | DELAYED_RELEASE_TABLET | Freq: Every day | ORAL | Status: DC

## 2012-08-07 ENCOUNTER — Ambulatory Visit: Payer: Self-pay | Admitting: Gynecologic Oncology

## 2012-08-26 ENCOUNTER — Other Ambulatory Visit: Payer: Self-pay | Admitting: General Practice

## 2012-08-26 MED ORDER — LISINOPRIL 2.5 MG PO TABS: 2.5000 mg | ORAL_TABLET | Freq: Every day | ORAL | Status: DC

## 2012-09-06 ENCOUNTER — Telehealth: Payer: Self-pay | Admitting: Internal Medicine

## 2012-09-06 DIAGNOSIS — Z0279 Encounter for issue of other medical certificate: Secondary | ICD-10-CM

## 2012-09-06 DIAGNOSIS — E1065 Type 1 diabetes mellitus with hyperglycemia: Secondary | ICD-10-CM

## 2012-09-06 NOTE — Telephone Encounter (Signed)
Pt has an appt next week

## 2012-09-06 NOTE — Telephone Encounter (Signed)
Please find out if she has seen the endocrinologist yet.  If not see needs to see nme ASAP and have her get CMRT and HgbA1c prior to visit and  bring log of blood sugars written out

## 2012-09-07 ENCOUNTER — Ambulatory Visit: Payer: Self-pay | Admitting: Gynecologic Oncology

## 2012-09-12 ENCOUNTER — Encounter: Payer: Self-pay | Admitting: Endocrinology

## 2012-09-12 ENCOUNTER — Ambulatory Visit (INDEPENDENT_AMBULATORY_CARE_PROVIDER_SITE_OTHER): Payer: Medicaid Other | Admitting: Endocrinology

## 2012-09-12 VITALS — BP 122/80 | HR 80 | Wt 140.0 lb

## 2012-09-12 DIAGNOSIS — E1065 Type 1 diabetes mellitus with hyperglycemia: Secondary | ICD-10-CM

## 2012-09-12 NOTE — Patient Instructions (Addendum)
good diet and exercise habits significanly improve the control of your diabetes.  please let me know if you wish to be referred to a dietician.  high blood sugar is very risky to your health.  you should see an eye doctor every year.  You are at higher than average risk for pneumonia and hepatitis-B.  You should be vaccinated against both.   controlling your blood pressure and cholesterol drastically reduces the damage diabetes does to your body.  this also applies to quitting smoking.  please discuss these with your doctor.  you should take an aspirin every day, unless you have been advised by a doctor not to. check your blood sugar 4 times a day--before the 3 meals, and at bedtime.  also check if you have symptoms of your blood sugar being too high or too low.  please keep a record of the readings and bring it to your next appointment here.  please call us sooner if your blood sugar goes below 70, or if you have a lot of readings over 200. blood tests are being requested for you today.  We'll contact you with results. For now: continue basal rate of 1.1 units/hr, except for units/hr, 24 hrs per day.   Refer to an insulin pump specialist.  you will receive a phone call, about a day and time for an appointment. Please come back for a follow-up appointment for 1 month.

## 2012-09-12 NOTE — Progress Notes (Signed)
Subjective:    Patient ID: Marcia [name], female    DOB: 01-20-1971, 42 y.o.   MRN: [id number]  HPI pt states 13 years h/o dm.  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  she has been on insulin since dx., and pump rx since 2006.  She has a medtronic pump since 2013.  pt says her diet and exercise are good.  She describes her cbg's as extremely variable.  She has 5 basal rates, varying from 1-1.4 units/hr.  Otherwise, she does not know her pump settings.  She takes a total of approx 70 units of humalog per day, via the pump. Pt reports few years of slight pain at the hands and feet, and assoc pain.   She has had a tubal ligation. Past Medical History  Diagnosis Date  . Vulvar lesion   . Myocardial infarction 1997    At age 27  . Diabetes mellitus   . History of HPV infection   . Heart murmur   . Hay fever     Allergies  . Vaginal cancer   . Thyroid disease   . Hypercholesterolemia   . Hypothyroidism   . Tobacco abuse     Past Surgical History  Procedure Date  . Ankle surgery 2010  . Tubal ligation 2004  . Tubal ligation 2004, [facility]    History   Social History  . Marital Status: Divorced    Spouse Name: N/A    Number of Children: N/A  . Years of Education: N/A   Occupational History  . Not on file.   Social History Main Topics  . Smoking status: Former Smoker -- 22 years    Types: Cigarettes  . Smokeless tobacco: Never Used  . Alcohol Use: No  . Drug Use: No  . Sexually Active: Not on file   Other Topics Concern  . Not on file   Social History Narrative   No regular exercise.Lives with spouse.    Current Outpatient Prescriptions on File Prior to Visit  Medication Sig Dispense Refill  . ALPRAZolam (XANAX) 0.5 MG tablet Take 1 tablet (0.5 mg total) by mouth at bedtime as needed for sleep.  30 tablet  3  . aspirin EC 81 MG tablet Take 1 tablet (81 mg total) by mouth daily.  30 tablet  11  . Cholecalciferol (VITAMIN D3) 50000 UNITS CAPS Take 50,000  capsules by mouth once a week.  4 capsule  2  . citalopram (CELEXA) 10 MG tablet TAKE 1 TABLET BY MOUTH EVERY DAY  30 tablet  2  . diazepam (VALIUM) 2 MG tablet Take 1 tablet (2 mg total) by mouth every 12 (twelve) hours as needed for anxiety.  60 tablet  3  . diclofenac (VOLTAREN) 75 MG EC tablet Take 75 mg by mouth 2 (two) times daily.        . insulin lispro (HUMALOG) 100 UNIT/ML injection Used as ordered for Insulin pump.  30 mL  6  . levothyroxine (SYNTHROID, LEVOTHROID) 112 MCG tablet Take 1 tablet (112 mcg total) by mouth daily.  30 tablet  5  . lisinopril (PRINIVIL,ZESTRIL) 2.5 MG tablet Take 1 tablet (2.5 mg total) by mouth daily.  90 tablet  3  . NON FORMULARY Humalog Insulin Pump.       . simvastatin (ZOCOR) 40 MG tablet Take 40 mg by mouth every evening.      . traMADol (ULTRAM) 50 MG tablet Take 1 tablet (50 mg total) by mouth every 6 (six) hours  as needed for pain.  120 tablet  1    No Known Allergies  Family History  Problem Relation Age of Onset  . Cancer Other     Aunt-breast; grandmother- lung and ovarian  . Diabetes Father   . Hypertension Mother   . Hyperlipidemia Mother   DM: mother and brother  BP 122/80  Pulse 80  Wt 140 lb (63.504 kg)  SpO2 98%  Review of Systems denies weight loss, blurry vision, chest pain, sob, n/v, urinary frequency, excessive diaphoresis, memory loss, depression, and rhinorrhea.  She has chronic headaches, easy bruising, and leg cramps.  She has menses every 2 weeks--us is scheduled.    Objective:   Physical Exam VS: see vs page GEN: no distress HEAD: head: no deformity eyes: no periorbital swelling, no proptosis. external nose and ears are normal mouth: no lesion seen NECK: supple, thyroid is not enlarged CHEST WALL: no deformity LUNGS:  Clear to auscultation CV: reg rate and rhythm, no murmur ABD: abdomen is soft, nontender.  no hepatosplenomegaly.  not distended.  no hernia MUSCULOSKELETAL: muscle bulk and strength are  grossly normal.  no obvious joint swelling.  gait is normal and steady EXTEMITIES: no deformity.  no ulcer on the feet.  feet are of normal color and temp.  no edema PULSES: dorsalis pedis intact bilat.  no carotid bruit NEURO:  cn 2-12 grossly intact.   readily moves all 4's.  sensation is intact to touch on the feet SKIN:  Normal texture and temperature.  No rash or suspicious lesion is visible.   NODES:  None palpable at the neck PSYCH: alert, oriented x3.  Does not appear anxious nor depressed.  Lab Results  Component Value Date   HGBA1C 9.7* 09/12/2012      Assessment & Plan:  Type 1 DM: She seems to be struggling with the complexity of her pump regimen, so we'll simplify it. Smoker.  This causes high risk to her health. Acral sxs, prob due to DM.   Depression.  This often complicates the rx of DM

## 2012-09-13 ENCOUNTER — Other Ambulatory Visit: Payer: Self-pay | Admitting: Internal Medicine

## 2012-09-13 DIAGNOSIS — E119 Type 2 diabetes mellitus without complications: Secondary | ICD-10-CM

## 2012-09-13 NOTE — Telephone Encounter (Signed)
Tramadol refilled, but Patient needs fasting labs and an OV to  follow up on diabetes.  refrrral to endocrinology apparently never happend  and sh is past due

## 2012-09-14 NOTE — Telephone Encounter (Signed)
Med phoned in °

## 2012-09-21 ENCOUNTER — Other Ambulatory Visit: Payer: Self-pay

## 2012-09-23 ENCOUNTER — Other Ambulatory Visit: Payer: Self-pay | Admitting: *Deleted

## 2012-09-23 ENCOUNTER — Other Ambulatory Visit: Payer: Self-pay | Admitting: Internal Medicine

## 2012-09-23 NOTE — Telephone Encounter (Signed)
Pt seen on 06/18/12. Ok to fill?

## 2012-09-23 NOTE — Telephone Encounter (Signed)
Pt had an appt with Dr. Everardo All on 09/12/12. Ok to fill?

## 2012-09-25 MED ORDER — VITAMIN D3 1.25 MG (50000 UT) PO CAPS: 50000.0000 | ORAL_CAPSULE | ORAL | Status: DC

## 2012-10-03 ENCOUNTER — Ambulatory Visit: Payer: Medicaid Other | Admitting: *Deleted

## 2012-10-03 ENCOUNTER — Ambulatory Visit: Payer: Self-pay | Admitting: Obstetrics & Gynecology

## 2012-10-03 LAB — CBC
HGB: 14.4 g/dL (ref 12.0–16.0)
MCH: 31.6 pg (ref 26.0–34.0)
Platelet: 232 10*3/uL (ref 150–440)

## 2012-10-05 ENCOUNTER — Ambulatory Visit: Payer: Self-pay | Admitting: Gynecologic Oncology

## 2012-10-10 ENCOUNTER — Ambulatory Visit: Payer: Self-pay | Admitting: Obstetrics & Gynecology

## 2012-10-10 ENCOUNTER — Ambulatory Visit: Payer: Medicaid Other | Admitting: Endocrinology

## 2012-10-11 LAB — PATHOLOGY REPORT

## 2012-10-12 HISTORY — PX: ABDOMINAL HYSTERECTOMY: SHX81

## 2012-10-17 ENCOUNTER — Ambulatory Visit: Payer: Medicaid Other | Admitting: *Deleted

## 2012-10-24 ENCOUNTER — Ambulatory Visit: Payer: Medicaid Other | Admitting: Endocrinology

## 2012-10-30 LAB — HM DIABETES EYE EXAM: HM Diabetic Eye Exam: NORMAL

## 2012-11-04 ENCOUNTER — Other Ambulatory Visit: Payer: Self-pay | Admitting: *Deleted

## 2012-11-04 MED ORDER — LISINOPRIL 2.5 MG PO TABS: 2.5000 mg | ORAL_TABLET | Freq: Every day | ORAL | Status: DC

## 2012-11-04 NOTE — Telephone Encounter (Signed)
Med filled.  

## 2012-11-05 ENCOUNTER — Ambulatory Visit: Payer: Self-pay | Admitting: Gynecologic Oncology

## 2012-12-25 ENCOUNTER — Encounter: Payer: Self-pay | Admitting: Internal Medicine

## 2012-12-25 ENCOUNTER — Ambulatory Visit (INDEPENDENT_AMBULATORY_CARE_PROVIDER_SITE_OTHER)
Admission: RE | Admit: 2012-12-25 | Discharge: 2012-12-25 | Disposition: A | Discharge status: Home / Self Care | Payer: Medicaid Other | Source: Ambulatory Visit | Attending: Internal Medicine | Admitting: Internal Medicine

## 2012-12-25 ENCOUNTER — Ambulatory Visit (INDEPENDENT_AMBULATORY_CARE_PROVIDER_SITE_OTHER): Payer: Medicaid Other | Admitting: Internal Medicine

## 2012-12-25 VITALS — BP 118/75 | HR 73 | Temp 97.8°F | Resp 16 | Wt 138.5 lb

## 2012-12-25 DIAGNOSIS — R05 Cough: Secondary | ICD-10-CM

## 2012-12-25 DIAGNOSIS — E039 Hypothyroidism, unspecified: Secondary | ICD-10-CM

## 2012-12-25 DIAGNOSIS — G4710 Hypersomnia, unspecified: Secondary | ICD-10-CM

## 2012-12-25 DIAGNOSIS — R5383 Other fatigue: Secondary | ICD-10-CM

## 2012-12-25 DIAGNOSIS — R0989 Other specified symptoms and signs involving the circulatory and respiratory systems: Secondary | ICD-10-CM

## 2012-12-25 DIAGNOSIS — Z720 Tobacco use: Secondary | ICD-10-CM

## 2012-12-25 DIAGNOSIS — R059 Cough, unspecified: Secondary | ICD-10-CM

## 2012-12-25 DIAGNOSIS — R0683 Snoring: Secondary | ICD-10-CM

## 2012-12-25 DIAGNOSIS — E1059 Type 1 diabetes mellitus with other circulatory complications: Secondary | ICD-10-CM

## 2012-12-25 DIAGNOSIS — R400 Somnolence: Secondary | ICD-10-CM

## 2012-12-25 DIAGNOSIS — R5381 Other malaise: Secondary | ICD-10-CM

## 2012-12-25 DIAGNOSIS — F17200 Nicotine dependence, unspecified, uncomplicated: Secondary | ICD-10-CM

## 2012-12-25 LAB — IRON AND TIBC
%SAT: 23 % (ref 20–55)
Iron: 98 ug/dL (ref 42–145)
TIBC: 423 ug/dL (ref 250–470)
UIBC: 325 ug/dL (ref 125–400)

## 2012-12-25 LAB — CBC WITH DIFFERENTIAL/PLATELET
Eosinophils Relative: 0.5 % (ref 0.0–5.0)
Lymphocytes Relative: 15 % (ref 12.0–46.0)
Monocytes Absolute: 1 10*3/uL (ref 0.1–1.0)
Monocytes Relative: 7 % (ref 3.0–12.0)
Neutrophils Relative %: 77.3 % — ABNORMAL HIGH (ref 43.0–77.0)
Platelets: 253 10*3/uL (ref 150.0–400.0)
WBC: 13.7 10*3/uL — ABNORMAL HIGH (ref 4.5–10.5)

## 2012-12-25 LAB — COMPREHENSIVE METABOLIC PANEL
AST: 16 U/L (ref 0–37)
Albumin: 4 g/dL (ref 3.5–5.2)
Alkaline Phosphatase: 54 U/L (ref 39–117)
BUN: 13 mg/dL (ref 6–23)
Calcium: 9 mg/dL (ref 8.4–10.5)
Chloride: 100 mEq/L (ref 96–112)
Potassium: 4.4 mEq/L (ref 3.5–5.1)
Sodium: 134 mEq/L — ABNORMAL LOW (ref 135–145)
Total Protein: 7.7 g/dL (ref 6.0–8.3)

## 2012-12-25 LAB — TSH: TSH: 2.65 u[IU]/mL (ref 0.35–5.50)

## 2012-12-25 LAB — LDL CHOLESTEROL, DIRECT: Direct LDL: 123.4 mg/dL

## 2012-12-25 NOTE — Patient Instructions (Addendum)
Your fatigue may be coming from anemia,  Low thyroid,  Or night time low oxygen levels.   1)  Labs and chest  X ray today ([facility] office for the x ray )  2) if these do not point to a cause,  I will refer you to [facility] for an overnight sleep study  I recommend resuming allegra daily to see if the cough improves

## 2012-12-25 NOTE — Progress Notes (Signed)
Patient ID: Marcia [name], female   DOB: 07-19-1971, 42 y.o.   MRN: [id number]  Patient Active Problem List   Diagnosis Date Noted  . Cough 12/26/2012  . Sprain of right wrist 06/18/2012  . Irregular menstrual bleeding 11/08/2011  . Diabetes mellitus type 1, uncontrolled, insulin dependent 05/22/2011  . Tobacco abuse 05/22/2011  . Tobacco abuse counseling 05/22/2011  . ABNORMAL EKG 09/10/2009    Subjective:  CC:   Chief Complaint  Patient presents with  . Follow-up    Hysterectomy on 3/8, C/O fatigue since that date.    HPI:   Marcia Maldonadois a 42 y.o. female who presents Acute visit for persistent cough and malaise.  She had Melanoma (vaginal) surgery in December, followed by a hysterectomy in March by Tiburcio Pea She reports a persistent dry cough, worse at night,  Accompanied by itchy throat. The cough started after the hysterectomy in March. Used to take allegra for allergic rhinitis but not lately.  Denies orthopnea.   Still smoking,  No fevers or dyspnea.  No preoperative CXR done per patient .  She has a history of snoring loudly and has been having difficulty staying awake in the afternoons    Past Medical History  Diagnosis Date  . Vulvar lesion   . Myocardial infarction 1997    At age 53  . Diabetes mellitus   . History of HPV infection   . Heart murmur   . Hay fever     Allergies  . Vaginal cancer   . Thyroid disease   . Hypercholesterolemia   . Hypothyroidism   . Tobacco abuse     Past Surgical History  Procedure Laterality Date  . Ankle surgery  2010  . Tubal ligation  2004  . Tubal ligation  2004, [facility]  . Abdominal hysterectomy  10/12/12       The following portions of the patient's history were reviewed and updated as appropriate: Allergies, current medications, and problem list.    Review of Systems:   12 Pt  review of systems was negative except those addressed in the HPI,     History   Social History  . Marital Status: Divorced   Spouse Name: N/A    Number of Children: N/A  . Years of Education: N/A   Occupational History  . Not on file.   Social History Main Topics  . Smoking status: Current Every Day Smoker -- 1.00 packs/day for 22 years    Types: Cigarettes  . Smokeless tobacco: Never Used  . Alcohol Use: No  . Drug Use: No  . Sexually Active: Not on file   Other Topics Concern  . Not on file   Social History Narrative   No regular exercise.   Lives with spouse.    Objective:  BP 118/75  Pulse 73  Temp(Src) 97.8 F (36.6 C) (Oral)  Resp 16  Wt 138 lb 8 oz (62.823 kg)  BMI 25.33 kg/m2  SpO2 99%  LMP 10/12/2012  General appearance: alert, cooperative and appears stated age Ears: normal TM's and external ear canals both ears Throat: lips, mucosa, and tongue normal; teeth and gums normal Neck: no adenopathy, no carotid bruit, supple, symmetrical, trachea midline and thyroid not enlarged, symmetric, no tenderness/mass/nodules Back: symmetric, no curvature. ROM normal. No CVA tenderness. Lungs: clear to auscultation bilaterally Heart: regular rate and rhythm, S1, S2 normal, no murmur, click, rub or gallop Abdomen: soft, non-tender; bowel sounds normal; no masses,  no organomegaly Pulses: 2+ and symmetric Skin:  Skin color, texture, turgor normal. No rashes or lesions Lymph nodes: Cervical, supraclavicular, and axillary nodes normal.  Assessment and Plan  Cough Given her history of tobacco and Ca,  CXR was done and was normal  Will resume antihistamine and if cough persists,  Refer for PFTs and sleep study  Tobacco abuse She has reduced her consumption to 3 daily  And does not allow smoking in the house.   Snoring disorder With mornign headaches,  Daytime somnolence.  sleep study ordered.    Updated Medication List Outpatient Encounter Prescriptions as of 12/25/2012  Medication Sig Dispense Refill  . ALPRAZolam (XANAX) 0.5 MG tablet Take 1 tablet (0.5 mg total) by mouth at bedtime as  needed for sleep.  30 tablet  3  . aspirin EC 81 MG tablet Take 1 tablet (81 mg total) by mouth daily.  30 tablet  11  . Cholecalciferol (VITAMIN D3) 50000 UNITS CAPS Take 50,000 capsules by mouth once a week.  4 capsule  2  . citalopram (CELEXA) 10 MG tablet TAKE 1 TABLET BY MOUTH EVERY DAY  30 tablet  2  . diazepam (VALIUM) 2 MG tablet Take 1 tablet (2 mg total) by mouth every 12 (twelve) hours as needed for anxiety.  60 tablet  3  . Insulin Infusion Pump Supplies (PARADIGM QUICK-SET 18" ) MISC 1 Device by Does not apply route every 3 (three) days.      . Insulin Infusion Pump Supplies (PARADIGM RESERVOIR ) MISC 1 Device by Does not apply route every 3 (three) days.      . insulin lispro (HUMALOG) 100 UNIT/ML injection Used as ordered for Insulin pump.  30 mL  6  . levothyroxine (SYNTHROID, LEVOTHROID) 112 MCG tablet Take 1 tablet (112 mcg total) by mouth daily.  30 tablet  5  . lisinopril (PRINIVIL,ZESTRIL) 2.5 MG tablet Take 1 tablet (2.5 mg total) by mouth daily.  90 tablet  1  . NON FORMULARY Humalog Insulin Pump.       . simvastatin (ZOCOR) 40 MG tablet Take 40 mg by mouth every evening.      . traMADol (ULTRAM) 50 MG tablet TAKE 1 TABLET BY MOUTH EVERY 6 HOURS AS NEEDED FOR PAIN  120 tablet  0  . diclofenac (VOLTAREN) 75 MG EC tablet Take 75 mg by mouth 2 (two) times daily.         No facility-administered encounter medications on file as of 12/25/2012.

## 2012-12-26 DIAGNOSIS — R0683 Snoring: Secondary | ICD-10-CM | POA: Insufficient documentation

## 2012-12-26 NOTE — Assessment & Plan Note (Signed)
She has reduced her consumption to 3 daily  And does not allow smoking in the house.

## 2012-12-26 NOTE — Assessment & Plan Note (Addendum)
Given her history of tobacco and Ca,  CXR was done and was normal  Will resume antihistamine and if cough persists,  Refer for PFTs and sleep study

## 2012-12-26 NOTE — Assessment & Plan Note (Signed)
With mornign headaches,  Daytime somnolence.  sleep study ordered.

## 2013-01-02 ENCOUNTER — Other Ambulatory Visit: Payer: Self-pay | Admitting: Internal Medicine

## 2013-01-06 ENCOUNTER — Other Ambulatory Visit: Payer: Self-pay | Admitting: *Deleted

## 2013-01-06 NOTE — Telephone Encounter (Signed)
Last OV 12/25/12

## 2013-01-07 MED ORDER — ALPRAZOLAM 0.5 MG PO TABS: 0.5000 mg | ORAL_TABLET | Freq: Every evening | ORAL | Status: DC | PRN

## 2013-01-07 MED ORDER — DIAZEPAM 2 MG PO TABS: 2.0000 mg | ORAL_TABLET | Freq: Two times a day (BID) | ORAL | Status: DC | PRN

## 2013-01-07 NOTE — Telephone Encounter (Signed)
Ok to refill valium and alprazolam ,   Authorized in epic

## 2013-01-07 NOTE — Telephone Encounter (Signed)
Rx's faxed to pharmacy

## 2013-01-08 ENCOUNTER — Telehealth: Payer: Self-pay | Admitting: Internal Medicine

## 2013-01-08 NOTE — Telephone Encounter (Signed)
Pt called checking sleep study results  She had done 6/2 @ [facility] great Please advise

## 2013-01-09 ENCOUNTER — Telehealth: Payer: Self-pay | Admitting: Internal Medicine

## 2013-01-09 ENCOUNTER — Encounter: Payer: Medicaid Other | Attending: Endocrinology | Admitting: *Deleted

## 2013-01-09 ENCOUNTER — Encounter: Payer: Self-pay | Admitting: *Deleted

## 2013-01-09 ENCOUNTER — Ambulatory Visit: Payer: Medicaid Other | Admitting: Adult Health

## 2013-01-09 DIAGNOSIS — E119 Type 2 diabetes mellitus without complications: Secondary | ICD-10-CM | POA: Insufficient documentation

## 2013-01-09 DIAGNOSIS — Z9641 Presence of insulin pump (external) (internal): Secondary | ICD-10-CM | POA: Insufficient documentation

## 2013-01-09 DIAGNOSIS — E1065 Type 1 diabetes mellitus with hyperglycemia: Secondary | ICD-10-CM

## 2013-01-09 DIAGNOSIS — Z713 Dietary counseling and surveillance: Secondary | ICD-10-CM | POA: Insufficient documentation

## 2013-01-09 DIAGNOSIS — R0683 Snoring: Secondary | ICD-10-CM

## 2013-01-09 NOTE — Telephone Encounter (Signed)
Her sleep study did not report sleep apnea but did report frequent awakenings.  If she is having them due to pain, or other identifiable reasons,  Let me known and I will prescribe something.

## 2013-01-09 NOTE — Telephone Encounter (Signed)
Spoke with pt regarding results, appointment had to be rescheduled until tomorrow. Pt states having headaches that wake her up at night, Aleve ineffective for pain. Has appointment with Raquel tomorrow to discuss headaches.

## 2013-01-09 NOTE — Telephone Encounter (Signed)
Has appointment with Raquel today.

## 2013-01-09 NOTE — Progress Notes (Signed)
Medical Nutrition Therapy:  Appt start time: 1030 end time:  1200  Assessment:  Primary concerns today: Diabetes on insulin pump. Lives with boyfriend, his Mom, and 42 yo son. She shops and cooks most of meals. She states she doesn't eat out often. States she SMBG 4 times a day. Little exercise right now due to 2 recent hospitalizations. She is currently on Medtronic insulin pump and is interested in improving her BG management by learning more about what the pump can do.  MEDICATIONS: see list   DIETARY INTAKE:  Usual eating pattern includes 2 meals and 0-1 snacks per day.  Everyday foods include fair variety of most food groups.  Avoided foods include mushrooms, cheese, milk, some veggies, regular sodas or sweet tea.    24-hr recall:  B ( AM): skips often  Snk ( AM): not usually   L ( PM): sandwich or leftovers, diet coke Snk ( PM): no D ( PM): meat, starch and vegetable type meal, occasionally bread, diet Coke Snk ( PM): no Beverages: diet Coke, coffee, water  Usual physical activity: recovering from 2 hospitializations  Estimated energy needs: not discussed at this visit  Progress Towards Goal(s):  In progress.   Nutritional Diagnosis:  NB-1.4 Self-monitoring deficit As related to use of insulin pump.  As evidenced by pump settings that she was not aware of.    Intervention:  Uploaded her pump to [facility] to assess her pump use habits and determine the settings. Reviewed reports with her and found the following:  MD note on 09/12/2012 mentioned a change in her Basal rates from multiple rates to a single rate of 1.1 units per hour. She states MD instructions were to change Basal Rates to 2.0 units per hour so there is a discrepancy.  Pump settings currently have 5 rates from 1.00 to 1.40 units per hour for a total basal insulin amount = 29.6 units per day. My suggestion is that since her average BG is running 294 +/- 164 that we use the total of 30 units per day and divide by 24  hours for a single basal rate of 1.25 units per hour and re-assess BG patterns after 1-2 weeks at that rate if MD chooses to make that change.  Maximum Bolus was set at 25 units but she rarely gives more than 12 per meal so decreased to 15  Dual/Square Wave turned ON but she does not use yet so turned OFF  Easy Bolus is ON and set at 2.0 unit increments. She does not use, so taught her when she might use it for food only and decreased to 1.0 unit   Active Insulin was set for 6 hours, decreased it to 4 hours to allow earlier correction bolus effectiveness.  Sensor Feature was turned ON but she does not have access to sensors financially, so turned OFF  Also taught her how to upload to [facility] and set up her account with User Name and Password so she can upload in the future. Will review [facility] reports with her at a future visit.  Discussed Carb Counting briefly in terms of food groups to help with carb counting when not eating at home. Handouts given during visit include: Carb Counting and Food Label handouts Meal Plan Card  Monitoring/Evaluation:  Dietary intake, exercise, SMBG 4 times a day, and body weight prn. Patient to make appointment with Dr. Everardo All now that she has met with me. Once she sees him again and Basal Rates can  be confirmed, she will call for next appointment with me.

## 2013-01-09 NOTE — Telephone Encounter (Signed)
Pt has appointment with Raquel today, will report at visit.

## 2013-01-10 ENCOUNTER — Ambulatory Visit (INDEPENDENT_AMBULATORY_CARE_PROVIDER_SITE_OTHER): Payer: Medicaid Other | Admitting: Internal Medicine

## 2013-01-10 ENCOUNTER — Encounter: Payer: Self-pay | Admitting: Internal Medicine

## 2013-01-10 ENCOUNTER — Ambulatory Visit: Payer: Self-pay | Admitting: Internal Medicine

## 2013-01-10 VITALS — BP 114/76 | HR 78 | Temp 98.0°F | Wt 140.0 lb

## 2013-01-10 DIAGNOSIS — R519 Headache, unspecified: Secondary | ICD-10-CM | POA: Insufficient documentation

## 2013-01-10 DIAGNOSIS — R51 Headache: Secondary | ICD-10-CM

## 2013-01-10 MED ORDER — HYDROCODONE-ACETAMINOPHEN 5-325 MG PO TABS: 1.0000 | ORAL_TABLET | Freq: Four times a day (QID) | ORAL | Status: DC | PRN

## 2013-01-10 MED ORDER — TIZANIDINE HCL 4 MG PO CAPS: 4.0000 mg | ORAL_CAPSULE | Freq: Three times a day (TID) | ORAL | Status: DC | PRN

## 2013-01-10 NOTE — Progress Notes (Signed)
Subjective:    Patient ID: Marcia [name], female    DOB: 03/27/1971, 42 y.o.   MRN: [id number]  HPI 42YO female with h/o DM, tobacco use, presents for acute visit c/o 3 weeks of severe diffuse headache, described as "worst headache of my life." Headache generally diffuse, occasionally pain radiates down back of neck. Pos photophobia. Neg phonophobia. No nausea, vomiting. No neck stiffness. No visual changes or focal neurologic signs. No fever, chills, rash. No head trauma. Brother has h/o cerebral aneurysm. Taking Aleve with no improvement.  Outpatient Encounter Prescriptions as of 01/10/2013  Medication Sig Dispense Refill  . ALPRAZolam (XANAX) 0.5 MG tablet Take 1 tablet (0.5 mg total) by mouth at bedtime as needed for sleep.  30 tablet  3  . aspirin EC 81 MG tablet Take 1 tablet (81 mg total) by mouth daily.  30 tablet  11  . Cholecalciferol (VITAMIN D3) 50000 UNITS CAPS Take 50,000 capsules by mouth once a week.  4 capsule  2  . citalopram (CELEXA) 10 MG tablet TAKE 1 TABLET BY MOUTH EVERY DAY  30 tablet  2  . diazepam (VALIUM) 2 MG tablet Take 1 tablet (2 mg total) by mouth every 12 (twelve) hours as needed for anxiety.  60 tablet  3  . diclofenac (VOLTAREN) 75 MG EC tablet Take 75 mg by mouth 2 (two) times daily.        . Insulin Infusion Pump Supplies (PARADIGM QUICK-SET 18" ) MISC 1 Device by Does not apply route every 3 (three) days.      . Insulin Infusion Pump Supplies (PARADIGM RESERVOIR ) MISC 1 Device by Does not apply route every 3 (three) days.      . insulin lispro (HUMALOG) 100 UNIT/ML injection Used as ordered for Insulin pump.  30 mL  6  . levothyroxine (SYNTHROID, LEVOTHROID) 112 MCG tablet Take 1 tablet (112 mcg total) by mouth daily.  30 tablet  5  . lisinopril (PRINIVIL,ZESTRIL) 2.5 MG tablet Take 1 tablet (2.5 mg total) by mouth daily.  90 tablet  1  . NON FORMULARY Humalog Insulin Pump.       . simvastatin (ZOCOR) 40 MG tablet Take 40 mg by mouth every evening.       Marland Kitchen HYDROcodone-acetaminophen (NORCO/VICODIN) 5-325 MG per tablet Take 1 tablet by mouth every 6 (six) hours as needed for pain.  30 tablet  0  . tiZANidine (ZANAFLEX) 4 MG capsule Take 1 capsule (4 mg total) by mouth 3 (three) times daily as needed for muscle spasms.  90 capsule  1   No facility-administered encounter medications on file as of 01/10/2013.   BP 114/76  Pulse 78  Temp(Src) 98 F (36.7 C) (Oral)  Wt 140 lb (63.504 kg)  BMI 25.6 kg/m2  SpO2 96%  LMP 10/12/2012  Review of Systems  Constitutional: Negative for fever, chills, appetite change, fatigue and unexpected weight change.  HENT: Negative for ear pain, congestion, sore throat, trouble swallowing, neck pain, voice change and sinus pressure.   Eyes: Positive for photophobia. Negative for visual disturbance.  Respiratory: Negative for cough, shortness of breath, wheezing and stridor.   Cardiovascular: Negative for chest pain, palpitations and leg swelling.  Gastrointestinal: Negative for nausea, vomiting, abdominal pain, diarrhea, constipation, blood in stool, abdominal distention and anal bleeding.  Genitourinary: Negative for dysuria and flank pain.  Musculoskeletal: Negative for myalgias, arthralgias and gait problem.  Skin: Negative for color change and rash.  Neurological: Positive for headaches. Negative for dizziness.  Hematological: Negative for adenopathy. Does not bruise/bleed easily.  Psychiatric/Behavioral: Negative for suicidal ideas, sleep disturbance and dysphoric mood. The patient is not nervous/anxious.        Objective:   Physical Exam  Constitutional: She is oriented to person, place, and time. She appears well-developed and well-nourished. No distress.  HENT:  Head: Normocephalic and atraumatic.  Right Ear: External ear normal.  Left Ear: External ear normal.  Nose: Nose normal.  Mouth/Throat: Oropharynx is clear and moist. No oropharyngeal exudate.  Eyes: Conjunctivae and EOM are normal.  Pupils are equal, round, and reactive to light. Right eye exhibits no discharge. Left eye exhibits no discharge. No scleral icterus.  Neck: Normal range of motion. Neck supple. No tracheal deviation present. No thyromegaly present.  Cardiovascular: Normal rate, regular rhythm, normal heart sounds and intact distal pulses.  Exam reveals no gallop and no friction rub.   No murmur heard. Pulmonary/Chest: Effort normal and breath sounds normal. No accessory muscle usage. Not tachypneic. No respiratory distress. She has no decreased breath sounds. She has no wheezes. She has no rhonchi. She has no rales. She exhibits no tenderness.  Musculoskeletal: Normal range of motion. She exhibits no edema and no tenderness.       Cervical back: She exhibits tenderness, pain and spasm (trapezius muscle bilaterally). She exhibits normal range of motion and no bony tenderness.  Lymphadenopathy:    She has no cervical adenopathy.  Neurological: She is alert and oriented to person, place, and time. No cranial nerve deficit. She exhibits normal muscle tone. Coordination normal.  Skin: Skin is warm and dry. No rash noted. She is not diaphoretic. No erythema. No pallor.  Psychiatric: She has a normal mood and affect. Her behavior is normal. Judgment and thought content normal.          Assessment & Plan:

## 2013-01-10 NOTE — Assessment & Plan Note (Signed)
3 weeks of severe headache, described as "worse headache of life." Exam normal except for some spasm of trapezius muscle. Will get STAT CT head without contrast to evaluate for [facility]. If CT normal, will start Zanaflex for muscular spasm and prn hydrocodone for severe pain. Follow up next week, and per results of CT.

## 2013-01-13 ENCOUNTER — Encounter: Payer: Self-pay | Admitting: Endocrinology

## 2013-01-13 ENCOUNTER — Ambulatory Visit (INDEPENDENT_AMBULATORY_CARE_PROVIDER_SITE_OTHER): Payer: Medicaid Other | Admitting: Endocrinology

## 2013-01-13 VITALS — BP 126/80 | HR 77 | Ht 63.0 in | Wt 137.0 lb

## 2013-01-13 DIAGNOSIS — E1065 Type 1 diabetes mellitus with hyperglycemia: Secondary | ICD-10-CM

## 2013-01-13 NOTE — Progress Notes (Signed)
Subjective:    Patient ID: Marcia [name], female    DOB: 04/12/1971, 42 y.o.   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  Pt says she now has a basal rate of 1.1 units/hr, 24 hrs per day.  She takes a total of approx 45 units total per day.  She has no cbg record.  She has mild hypoglycemia in the middle of the night, approximately once a week.  She says cbg's are highest in the middle of the day.   Past Medical History  Diagnosis Date  . Vulvar lesion   . Myocardial infarction 1997    At age 10  . Diabetes mellitus   . History of HPV infection   . Heart murmur   . Hay fever     Allergies  . Vaginal cancer   . Thyroid disease   . Hypercholesterolemia   . Hypothyroidism   . Tobacco abuse     Past Surgical History  Procedure Laterality Date  . Ankle surgery  2010  . Tubal ligation  2004  . Tubal ligation  2004, [facility]  . Abdominal hysterectomy  10/12/12    History   Social History  . Marital Status: Divorced    Spouse Name: N/A    Number of Children: N/A  . Years of Education: N/A   Occupational History  . Not on file.   Social History Main Topics  . Smoking status: Current Every Day Smoker -- 1.00 packs/day for 22 years    Types: Cigarettes  . Smokeless tobacco: Never Used  . Alcohol Use: No  . Drug Use: No  . Sexually Active: Not on file   Other Topics Concern  . Not on file   Social History Narrative   No regular exercise.   Lives with spouse.    Current Outpatient Prescriptions on File Prior to Visit  Medication Sig Dispense Refill  . ALPRAZolam (XANAX) 0.5 MG tablet Take 1 tablet (0.5 mg total) by mouth at bedtime as needed for sleep.  30 tablet  3  . aspirin EC 81 MG tablet Take 1 tablet (81 mg total) by mouth daily.  30 tablet  11   . Cholecalciferol (VITAMIN D3) 50000 UNITS CAPS Take 50,000 capsules by mouth once a week.  4 capsule  2  . citalopram (CELEXA) 10 MG tablet TAKE 1 TABLET BY MOUTH EVERY DAY  30 tablet  2  . diazepam (VALIUM) 2 MG tablet Take 1 tablet (2 mg total) by mouth every 12 (twelve) hours as needed for anxiety.  60 tablet  3  . diclofenac (VOLTAREN) 75 MG EC tablet Take 75 mg by mouth 2 (two) times daily.        Marland Kitchen HYDROcodone-acetaminophen (NORCO/VICODIN) 5-325 MG per tablet Take 1 tablet by mouth every 6 (six) hours as needed for pain.  30 tablet  0  . Insulin Infusion Pump Supplies (PARADIGM QUICK-SET 18" ) MISC 1 Device by Does not apply route every 3 (three) days.      . Insulin Infusion Pump Supplies (PARADIGM RESERVOIR ) MISC 1 Device by Does not apply route every 3 (three) days.      Marland Kitchen levothyroxine (SYNTHROID, LEVOTHROID) 112 MCG tablet Take 1 tablet (112 mcg total) by mouth daily.  30 tablet  5  . lisinopril (PRINIVIL,ZESTRIL) 2.5 MG tablet Take 1 tablet (2.5 mg total) by mouth daily.  90 tablet  1  . NON FORMULARY Humalog Insulin Pump.       . simvastatin (ZOCOR) 40 MG tablet Take 40 mg by mouth every evening.      Marland Kitchen tiZANidine (ZANAFLEX) 4 MG capsule Take 1 capsule (4 mg total) by mouth 3 (three) times daily as needed for muscle spasms.  90 capsule  1   No current facility-administered medications on file prior to visit.    No Known Allergies  Family History  Problem Relation Age of Onset  . Cancer Other     Aunt-breast; grandmother- lung and ovarian  . Diabetes Father   . Hypertension Mother   . Hyperlipidemia Mother   . Aneurysm Brother    BP 126/80  Pulse 77  Ht 5\' 3"  (1.6 m)  Wt 137 lb (62.143 kg)  BMI 24.27 kg/m2  SpO2 98%  LMP 10/12/2012  Review of Systems Denies LOC and weight change.    Objective:   Physical Exam VITAL SIGNS:  See vs page GENERAL: no distress.  Lab Results  Component Value Date   HGBA1C 9.5* 12/25/2012      Assessment & Plan:  Type 1  DM: This insulin pump regimen was chosen from multiple options, as it best matches her insulin to her changing requirements throughout the day.  The benefits of glycemic control must be weighed against the risks of hypoglycemia.  based on the pattern of her reported cbg's, she needs less basal and more bolus insulin.

## 2013-01-13 NOTE — Patient Instructions (Addendum)
check your blood sugar 4 times a day--before the 3 meals, and at bedtime.  also check if you have symptoms of your blood sugar being too high or too low.  please keep a record of the readings and bring it to your next appointment here.  please call us sooner if your blood sugar goes below 70, or if you have a lot of readings over 200.   Please reduce your basal rate to 1 unit/hr, 24 hrs per day.   Please increase your mealtime bolus to 1 unit/6 grams of carbohydrate.   continue correction bolus (which some people call "sensitivity," or "insulin sensitivity ratio," or just "isr") of 1 unit for each 40 by which your glucose exceeds 100 Please come back for a follow-up appointment in 6 weeks.

## 2013-01-17 ENCOUNTER — Ambulatory Visit: Payer: Self-pay | Admitting: Internal Medicine

## 2013-01-21 ENCOUNTER — Encounter: Payer: Self-pay | Admitting: Adult Health

## 2013-01-21 ENCOUNTER — Ambulatory Visit (INDEPENDENT_AMBULATORY_CARE_PROVIDER_SITE_OTHER): Payer: Medicaid Other | Admitting: Adult Health

## 2013-01-21 VITALS — BP 110/62 | HR 75 | Temp 98.2°F | Resp 12 | Wt 134.0 lb

## 2013-01-21 DIAGNOSIS — Z7189 Other specified counseling: Secondary | ICD-10-CM

## 2013-01-21 DIAGNOSIS — Z720 Tobacco use: Secondary | ICD-10-CM

## 2013-01-21 DIAGNOSIS — F17200 Nicotine dependence, unspecified, uncomplicated: Secondary | ICD-10-CM

## 2013-01-21 DIAGNOSIS — Z716 Tobacco abuse counseling: Secondary | ICD-10-CM

## 2013-01-21 DIAGNOSIS — J329 Chronic sinusitis, unspecified: Secondary | ICD-10-CM

## 2013-01-21 MED ORDER — AMOXICILLIN-POT CLAVULANATE 875-125 MG PO TABS: 1.0000 | ORAL_TABLET | Freq: Two times a day (BID) | ORAL | Status: DC

## 2013-01-21 NOTE — Assessment & Plan Note (Signed)
Has a strong tobacco odor. Consistent with heavy smoking.

## 2013-01-21 NOTE — Assessment & Plan Note (Signed)
Encouraged patient on smoking cessation. Explained tobacco exacerbates her symptoms in addition to cardiac and lung complications in the future.

## 2013-01-21 NOTE — Assessment & Plan Note (Signed)
Start Augmentin twice a day x10 days. Robitussin or Delsym for cough. Afrin x3 days for severe congestion. RTC if symptoms are not improved within 4-5 days.

## 2013-01-21 NOTE — Progress Notes (Signed)
Subjective:    Patient ID: Marcia [name], female    DOB: 19-Feb-1971, 42 y.o.   MRN: [id number]  HPI  Patient is a 42 year old female with history of type 1 diabetes, tobacco abuse who presents to clinic with complaints of sinus pressure, congestion, rhinorrhea, postnasal drip which started approximately one week ago. She also reports coughing brownish sputum. She has tried over-the-counter Mucinex without improvement. Denies chest pain or shortness of breath.   Current Outpatient Prescriptions on File Prior to Visit  Medication Sig Dispense Refill  . ALPRAZolam (XANAX) 0.5 MG tablet Take 1 tablet (0.5 mg total) by mouth at bedtime as needed for sleep.  30 tablet  3  . aspirin EC 81 MG tablet Take 1 tablet (81 mg total) by mouth daily.  30 tablet  11  . Cholecalciferol (VITAMIN D3) 50000 UNITS CAPS Take 50,000 capsules by mouth once a week.  4 capsule  2  . citalopram (CELEXA) 10 MG tablet TAKE 1 TABLET BY MOUTH EVERY DAY  30 tablet  2  . diazepam (VALIUM) 2 MG tablet Take 1 tablet (2 mg total) by mouth every 12 (twelve) hours as needed for anxiety.  60 tablet  3  . diclofenac (VOLTAREN) 75 MG EC tablet Take 75 mg by mouth 2 (two) times daily.        Marland Kitchen HYDROcodone-acetaminophen (NORCO/VICODIN) 5-325 MG per tablet Take 1 tablet by mouth every 6 (six) hours as needed for pain.  30 tablet  0  . Insulin Infusion Pump Supplies (PARADIGM QUICK-SET 18" ) MISC 1 Device by Does not apply route every 3 (three) days.      . Insulin Infusion Pump Supplies (PARADIGM RESERVOIR ) MISC 1 Device by Does not apply route every 3 (three) days.      . insulin lispro (HUMALOG) 100 UNIT/ML injection Used as ordered for Insulin pump, total of 60 units per day.      . levothyroxine (SYNTHROID, LEVOTHROID) 112 MCG tablet Take 1 tablet (112 mcg total) by mouth daily.  30 tablet  5  . lisinopril (PRINIVIL,ZESTRIL) 2.5 MG tablet Take 1 tablet (2.5 mg total) by mouth daily.  90 tablet  1  . NON FORMULARY Humalog  Insulin Pump.       . simvastatin (ZOCOR) 40 MG tablet Take 40 mg by mouth every evening.      Marland Kitchen tiZANidine (ZANAFLEX) 4 MG capsule Take 1 capsule (4 mg total) by mouth 3 (three) times daily as needed for muscle spasms.  90 capsule  1   No current facility-administered medications on file prior to visit.     Review of Systems  Constitutional: Negative for fever and chills.  HENT: Positive for congestion, rhinorrhea, postnasal drip and sinus pressure. Negative for hearing loss, ear pain and sore throat.   Respiratory: Positive for cough. Negative for shortness of breath and wheezing.   Cardiovascular: Negative for chest pain.  Gastrointestinal: Negative.     BP 110/62  Pulse 75  Temp(Src) 98.2 F (36.8 C) (Oral)  Resp 12  Wt 134 lb (60.782 kg)  BMI 23.74 kg/m2  SpO2 95%  LMP 10/12/2012    Objective:   Physical Exam  Constitutional: She is oriented to person, place, and time. She appears well-developed and well-nourished.  Pt smells of tobacco  HENT:  Head: Normocephalic and atraumatic.  Right Ear: External ear normal.  Left Ear: External ear normal.  Pharyngeal erythema without exudate. Drainage noted posterior pharynx.  Neck: Normal range of motion. Neck supple. No  tracheal deviation present.  Cardiovascular: Normal rate, regular rhythm, normal heart sounds and intact distal pulses.  Exam reveals no gallop and no friction rub.   No murmur heard. Pulmonary/Chest: Effort normal and breath sounds normal. No respiratory distress. She has no wheezes. She has no rales.  Lymphadenopathy:    She has no cervical adenopathy.  Neurological: She is alert and oriented to person, place, and time. Coordination normal.  Skin: Skin is warm and dry.  Psychiatric: She has a normal mood and affect. Her behavior is normal. Judgment and thought content normal.          Assessment & Plan:

## 2013-01-21 NOTE — Patient Instructions (Addendum)
Start Augmentin twice daily for 10 days until done.  Robitussin or Delsym for cough.  Afrin for severe congestion.   Below is additional information on sinusitis:  What is sinusitis? - Sinusitis is a condition that can cause a stuffy nose, pain in the face, and yellow or green discharge (mucus) from the nose. The sinuses are hollow areas in the bones of the face. They have a thin lining that normally makes a small amount of mucus. When this lining gets infected, it swells and makes extra mucus. This causes symptoms.   Sinusitis can occur when a person gets sick with a cold. The germs causing the cold can also infect the sinuses. Many times, a person feels like his or her cold is getting better. But then he or she gets sinusitis and begins to feel sick again.  What are the symptoms of sinusitis? - Common symptoms of sinusitis include: Stuffy or blocked nose  Thick yellow or green discharge from the nose  Pain in the teeth  Pain or pressure in the face - This often feels worse when a person bends forward.   People with sinusitis can also have other symptoms that include: Fever  Cough  Trouble smelling  Ear pressure or fullness  Headache  Bad breath  Feeling tired   Most of the time, symptoms start to improve in 7 to 10 days.  Should I see a doctor or nurse? - See your doctor or nurse if your symptoms last more than 7 days, or if your symptoms get better at first but then get worse. Sometimes, sinusitis can lead to serious problems. See your doctor or nurse right away (do not wait 7 days) if you have: Fever higher than 102.57F (39.2C)  Sudden and severe pain in the face and head  Trouble seeing or seeing double  Trouble thinking clearly  Swelling or redness around 1 or both eyes  Trouble breathing or a stiff neck   Is there anything I can do on my own to feel better? - Yes. To reduce your symptoms, you can: Take an over-the-counter pain reliever to reduce the pain  Rinse  your nose and sinuses with salt water a few times a day - Ask your doctor or nurse about the best way to do this.  Use a decongestant nose spray - These sprays are sold in a pharmacy. But do not use decongestant nose sprays for more than 2 to 3 days in a row. Using them more than 3 days in a row can make symptoms worse.   You should NOT take an antihistamine for sinusitis. Common antihistamines include diphenhydramine (sample brand name: Benadryl), chlorpheniramine (sample brand name: Chlor-Trimeton), loratadine (sample brand name: Claritin), and cetirizine (sample brand name: Zyrtec). They can treat allergies, but not sinus infections, and could increase your discomfort by drying the lining of your nose and sinuses, or making you tired.   Your doctor might also prescribe a steroid nose spray to reduce the swelling in your nose. (Steroid nose sprays do not contain the same steroids that athletes take to build muscle.)  How is sinusitis treated? - Most of the time, sinusitis does not need to be treated with antibiotic medicines. This is because most sinusitis is caused by viruses - not bacteria - and antibiotics do not kill viruses. Many people get over sinus infections without antibiotics.  Some people with sinusitis do need treatment with antibiotics. If your symptoms have not improved after 7 to 10 days, ask your  doctor if you should take antibiotics. Your doctor might recommend that you wait 1 more week to see if your symptoms improve. But if you have symptoms such as a fever or a lot of pain, he or she might prescribe antibiotics. It is important to follow your doctor's instructions about taking your antibiotics.  What if my symptoms do not get better? - If your symptoms do not get better, talk with your doctor or nurse. He or she might order tests to figure out why you still have symptoms. These can include:  CT scan or other imaging tests - Imaging tests create pictures of the inside of the body.   A test to look inside the sinuses - For this test, a doctor puts a thin tube with a camera on the end into the nose and up into the sinuses.   Some people get a lot of sinus infections or have symptoms that last at least 3 months. These people can have a different type of sinusitis called "chronic sinusitis." Chronic sinusitis can be caused by different things. For example, some people have growths in their nose or sinuses that are called "polyps." Other people have allergies that cause their symptoms.  Chronic sinusitis can be treated in different ways. If you have chronic sinusitis, talk with your doctor about which treatments are right for you.

## 2013-01-22 ENCOUNTER — Encounter: Payer: Self-pay | Admitting: Internal Medicine

## 2013-01-24 ENCOUNTER — Ambulatory Visit: Payer: Medicaid Other | Admitting: Adult Health

## 2013-01-24 ENCOUNTER — Ambulatory Visit: Payer: Self-pay | Admitting: Orthopedic Surgery

## 2013-02-03 ENCOUNTER — Other Ambulatory Visit: Payer: Self-pay | Admitting: *Deleted

## 2013-02-03 DIAGNOSIS — E039 Hypothyroidism, unspecified: Secondary | ICD-10-CM

## 2013-02-03 MED ORDER — LEVOTHYROXINE SODIUM 112 MCG PO TABS: 112.0000 ug | ORAL_TABLET | Freq: Every day | ORAL | Status: DC

## 2013-02-03 NOTE — Telephone Encounter (Signed)
Rxs faxed 01/06/13 with 3 refills available, confirmed by pharmacy.

## 2013-02-03 NOTE — Telephone Encounter (Deleted)
Refills faxed.

## 2013-02-17 ENCOUNTER — Other Ambulatory Visit: Payer: Self-pay | Admitting: Internal Medicine

## 2013-02-17 ENCOUNTER — Encounter: Payer: Self-pay | Admitting: Adult Health

## 2013-02-17 ENCOUNTER — Ambulatory Visit (INDEPENDENT_AMBULATORY_CARE_PROVIDER_SITE_OTHER): Payer: Medicaid Other | Admitting: Adult Health

## 2013-02-17 VITALS — BP 102/62 | HR 65 | Temp 98.4°F | Resp 12 | Wt 134.0 lb

## 2013-02-17 DIAGNOSIS — R21 Rash and other nonspecific skin eruption: Secondary | ICD-10-CM | POA: Insufficient documentation

## 2013-02-17 MED ORDER — PREDNISONE 10 MG PO TABS: ORAL_TABLET | ORAL | Status: DC

## 2013-02-17 MED ORDER — HYDROCORTISONE 2.5 % EX LOTN: TOPICAL_LOTION | Freq: Two times a day (BID) | CUTANEOUS | Status: DC

## 2013-02-17 NOTE — Progress Notes (Signed)
Subjective:    Patient ID: Alka Falwell, female    DOB: 03-06-1971, 42 y.o.   MRN: [id number]  HPI  Patient is a 42 year old female who presents to clinic with a rash underneath bilateral arms and on left side of her back. She reports it is very itchy. She has not started using any new detergents, soaps, lotions or perfumes. She denies any other systemic symptoms. She reports starting new medication approximately 2 weeks ago - Neurontin. She has stopped taking this medication 2 days ago.  Current Outpatient Prescriptions on File Prior to Visit  Medication Sig Dispense Refill  . ALPRAZolam (XANAX) 0.5 MG tablet Take 1 tablet (0.5 mg total) by mouth at bedtime as needed for sleep.  30 tablet  3  . aspirin EC 81 MG tablet Take 1 tablet (81 mg total) by mouth daily.  30 tablet  11  . Cholecalciferol (VITAMIN D3) 50000 UNITS CAPS Take 50,000 capsules by mouth once a week.  4 capsule  2  . citalopram (CELEXA) 10 MG tablet TAKE 1 TABLET BY MOUTH EVERY DAY  30 tablet  2  . diazepam (VALIUM) 2 MG tablet Take 1 tablet (2 mg total) by mouth every 12 (twelve) hours as needed for anxiety.  60 tablet  3  . diclofenac (VOLTAREN) 75 MG EC tablet Take 75 mg by mouth 2 (two) times daily.        Marland Kitchen HYDROcodone-acetaminophen (NORCO/VICODIN) 5-325 MG per tablet Take 1 tablet by mouth every 6 (six) hours as needed for pain.  30 tablet  0  . Insulin Infusion Pump Supplies (PARADIGM QUICK-SET 18" ) MISC 1 Device by Does not apply route every 3 (three) days.      . Insulin Infusion Pump Supplies (PARADIGM RESERVOIR ) MISC 1 Device by Does not apply route every 3 (three) days.      . insulin lispro (HUMALOG) 100 UNIT/ML injection Used as ordered for Insulin pump, total of 60 units per day.      . levothyroxine (SYNTHROID, LEVOTHROID) 112 MCG tablet Take 1 tablet (112 mcg total) by mouth daily.  30 tablet  10  . lisinopril (PRINIVIL,ZESTRIL) 2.5 MG tablet Take 1 tablet (2.5 mg total) by mouth daily.  90 tablet   1  . NON FORMULARY Humalog Insulin Pump.       . simvastatin (ZOCOR) 40 MG tablet Take 40 mg by mouth every evening.      Marland Kitchen tiZANidine (ZANAFLEX) 4 MG capsule Take 1 capsule (4 mg total) by mouth 3 (three) times daily as needed for muscle spasms.  90 capsule  1   No current facility-administered medications on file prior to visit.     Review of Systems  Constitutional: Negative.   Respiratory: Negative.   Cardiovascular: Negative.   Skin: Positive for rash.       Itching. Rash underneath bilateral arms and on left side of her back.  Neurological: Negative.   Psychiatric/Behavioral: Negative.        Objective:   Physical Exam  Constitutional: She is oriented to person, place, and time. She appears well-developed and well-nourished. No distress.  Cardiovascular: Normal rate and regular rhythm.   Pulmonary/Chest: Effort normal. No respiratory distress.  Neurological: She is alert and oriented to person, place, and time.  Skin: Skin is warm and dry. Rash noted. There is erythema.  Skin is very dry at the site of rash: bilaterally under arms, left side of back underneath her bra straps and laterally.  Psychiatric: She has  a normal mood and affect. Her behavior is normal. Judgment and thought content normal.          Assessment & Plan:

## 2013-02-17 NOTE — Assessment & Plan Note (Signed)
Rash of unknown etiology. Start prednisone taper. Take 60 mg on the first day and taper by 10 mg daily until complete. Hydrocortisone 2.5% lotion to affected area twice a day. Continue Benadryl as needed for itching. RTC if symptoms are not improved with completion of steroid taper. Patient will continue to hold Neurontin until rash clears. At which time the patient will resume Neurontin. If rash reappears will discontinue gabapentin.

## 2013-02-17 NOTE — Patient Instructions (Addendum)
  Start the prednisone taper:   Take 60 mg (6 tablets) today then taper by 10 mg (1 tablet) daily until done.   Take pills all at the same time. Take with food.  Apply hydrocortisone 2.5% lotion to affected areas twice a day.  Hold the neurontin (gabapentin) until the rash is healed then start taking again. If you develop the same rash then we know it is the medication.

## 2013-02-18 ENCOUNTER — Other Ambulatory Visit: Payer: Self-pay | Admitting: Adult Health

## 2013-02-18 MED ORDER — VARENICLINE TARTRATE 0.5 MG X 11 & 1 MG X 42 PO MISC: ORAL | Status: DC

## 2013-02-20 ENCOUNTER — Emergency Department: Payer: Self-pay | Admitting: Emergency Medicine

## 2013-02-20 ENCOUNTER — Ambulatory Visit: Payer: Medicaid Other | Admitting: Adult Health

## 2013-02-20 LAB — URINALYSIS, COMPLETE
Bilirubin,UR: NEGATIVE
Glucose,UR: >=500 mg/dL (ref 0–75)
Leukocyte Esterase: NEGATIVE
Nitrite: NEGATIVE
Ph: 5 (ref 4.5–8.0)
Protein: NEGATIVE
RBC,UR: 2 /HPF (ref 0–5)
Specific Gravity: 1.027 (ref 1.003–1.030)
WBC UR: 2 /HPF (ref 0–5)

## 2013-02-20 LAB — CBC WITH DIFFERENTIAL/PLATELET
Basophil #: 0.1 10*3/uL (ref 0.0–0.1)
Basophil %: 0.7 %
Eosinophil %: 0.5 %
HCT: 44.7 % (ref 35.0–47.0)
HGB: 15.3 g/dL (ref 12.0–16.0)
Lymphocyte #: 2.9 10*3/uL (ref 1.0–3.6)
Lymphocyte %: 22.2 %
Monocyte #: 1.1 x10 3/mm — ABNORMAL HIGH (ref 0.2–0.9)
Monocyte %: 8.6 %
Neutrophil #: 9 10*3/uL — ABNORMAL HIGH (ref 1.4–6.5)
Neutrophil %: 68 %
RDW: 14.1 % (ref 11.5–14.5)

## 2013-02-20 LAB — WET PREP, GENITAL

## 2013-02-20 LAB — COMPREHENSIVE METABOLIC PANEL
Alkaline Phosphatase: 88 U/L (ref 50–136)
Anion Gap: 10 (ref 7–16)
BUN: 16 mg/dL (ref 7–18)
Bilirubin,Total: 0.8 mg/dL (ref 0.2–1.0)
Calcium, Total: 8.8 mg/dL (ref 8.5–10.1)
Creatinine: 0.87 mg/dL (ref 0.60–1.30)
Potassium: 3.7 mmol/L (ref 3.5–5.1)
SGOT(AST): 10 U/L — ABNORMAL LOW (ref 15–37)
SGPT (ALT): 15 U/L (ref 12–78)
Sodium: 136 mmol/L (ref 136–145)
Total Protein: 7.4 g/dL (ref 6.4–8.2)

## 2013-02-20 LAB — GC/CHLAMYDIA PROBE AMP

## 2013-02-20 LAB — LIPASE, BLOOD: Lipase: 83 U/L (ref 73–393)

## 2013-02-24 ENCOUNTER — Ambulatory Visit: Payer: Medicaid Other | Admitting: Endocrinology

## 2013-03-09 ENCOUNTER — Other Ambulatory Visit: Payer: Self-pay | Admitting: Internal Medicine

## 2013-03-27 ENCOUNTER — Ambulatory Visit: Payer: Self-pay | Admitting: Obstetrics & Gynecology

## 2013-03-27 ENCOUNTER — Encounter: Payer: Self-pay | Admitting: Internal Medicine

## 2013-03-27 LAB — CBC
HCT: 44.1 % (ref 35.0–47.0)
HGB: 15.2 g/dL (ref 12.0–16.0)
MCHC: 34.4 g/dL (ref 32.0–36.0)
MCV: 90 fL (ref 80–100)
Platelet: 268 10*3/uL (ref 150–440)
WBC: 11.2 10*3/uL — ABNORMAL HIGH (ref 3.6–11.0)

## 2013-04-01 ENCOUNTER — Ambulatory Visit: Payer: Self-pay | Admitting: Obstetrics & Gynecology

## 2013-04-02 LAB — PATHOLOGY REPORT

## 2013-04-28 DIAGNOSIS — E0590 Thyrotoxicosis, unspecified without thyrotoxic crisis or storm: Secondary | ICD-10-CM | POA: Insufficient documentation

## 2013-04-28 DIAGNOSIS — M25531 Pain in right wrist: Secondary | ICD-10-CM | POA: Insufficient documentation

## 2013-04-28 DIAGNOSIS — E109 Type 1 diabetes mellitus without complications: Secondary | ICD-10-CM | POA: Insufficient documentation

## 2013-05-01 ENCOUNTER — Encounter: Payer: Self-pay | Admitting: *Deleted

## 2013-05-02 ENCOUNTER — Encounter: Payer: Self-pay | Admitting: Internal Medicine

## 2013-05-02 ENCOUNTER — Ambulatory Visit (INDEPENDENT_AMBULATORY_CARE_PROVIDER_SITE_OTHER): Payer: Medicaid Other | Admitting: Internal Medicine

## 2013-05-02 VITALS — BP 138/78 | HR 79 | Temp 98.3°F | Resp 14 | Ht 62.0 in | Wt 135.0 lb

## 2013-05-02 DIAGNOSIS — R9431 Abnormal electrocardiogram [ECG] [EKG]: Secondary | ICD-10-CM

## 2013-05-02 DIAGNOSIS — Z01818 Encounter for other preprocedural examination: Secondary | ICD-10-CM

## 2013-05-02 DIAGNOSIS — Z7189 Other specified counseling: Secondary | ICD-10-CM

## 2013-05-02 DIAGNOSIS — Z1239 Encounter for other screening for malignant neoplasm of breast: Secondary | ICD-10-CM

## 2013-05-02 DIAGNOSIS — Z23 Encounter for immunization: Secondary | ICD-10-CM

## 2013-05-02 DIAGNOSIS — S63501S Unspecified sprain of right wrist, sequela: Secondary | ICD-10-CM

## 2013-05-02 DIAGNOSIS — F17200 Nicotine dependence, unspecified, uncomplicated: Secondary | ICD-10-CM

## 2013-05-02 DIAGNOSIS — Z716 Tobacco abuse counseling: Secondary | ICD-10-CM

## 2013-05-02 DIAGNOSIS — IMO0002 Reserved for concepts with insufficient information to code with codable children: Secondary | ICD-10-CM

## 2013-05-02 DIAGNOSIS — E1065 Type 1 diabetes mellitus with hyperglycemia: Secondary | ICD-10-CM

## 2013-05-02 DIAGNOSIS — Z720 Tobacco use: Secondary | ICD-10-CM

## 2013-05-02 LAB — COMPREHENSIVE METABOLIC PANEL
Albumin: 3.8 g/dL (ref 3.5–5.2)
CO2: 26 mEq/L (ref 19–32)
GFR: 85.94 mL/min (ref >60.00)
Glucose, Bld: 106 mg/dL — ABNORMAL HIGH (ref 70–99)
Potassium: 3.7 mEq/L (ref 3.5–5.1)
Sodium: 136 mEq/L (ref 135–145)
Total Bilirubin: 0.7 mg/dL (ref 0.3–1.2)
Total Protein: 6.8 g/dL (ref 6.0–8.3)

## 2013-05-02 LAB — LIPID PANEL
Cholesterol: 197 mg/dL (ref 0–200)
HDL: 62.4 mg/dL (ref >39.00)

## 2013-05-02 LAB — MICROALBUMIN / CREATININE URINE RATIO
Creatinine,U: 35.6 mg/dL
Microalb, Ur: 0.9 mg/dL (ref 0.0–1.9)

## 2013-05-02 LAB — HEMOGLOBIN A1C: Hgb A1c MFr Bld: 9.5 % — ABNORMAL HIGH (ref 4.6–6.5)

## 2013-05-02 LAB — HM DIABETES FOOT EXAM: HM Diabetic Foot Exam: NORMAL

## 2013-05-02 MED ORDER — METOPROLOL TARTRATE 25 MG PO TABS: 25.0000 mg | ORAL_TABLET | Freq: Two times a day (BID) | ORAL | Status: DC

## 2013-05-02 NOTE — Patient Instructions (Addendum)
I am recommending that you start a low dose of metoprolol tartrate (Lopressor)  25 mg twice daily  Prior to your surgery.  This is commonly used around the time of surgery to prevent cardiac complications.    I have checked the labs for Dr Everardo All today ; please call his office ot make an appt for DM followup

## 2013-05-02 NOTE — Assessment & Plan Note (Addendum)
She states that she was told she had a heart attack years ago bc she had an abnormal EKG.  Patient was evaluated for CAD with a stress test in 2011 for abnormal EKG preoperatively and no ischemia was found.  She has no history of chest pain, palpitations or exertional dyspnea, although she has multiple CRFs including DM and tobacco abuse.  She underwent surgery with general anesthesia 4 weeks ago with no perioperative complications. repeat EKG todayis unchanged.  There is no indication for preoperative testing however I do  recommend low dose beta blocker.

## 2013-05-02 NOTE — Progress Notes (Signed)
Patient ID: Marcia [name], female   DOB: January 11, 1971, 42 y.o.   MRN: [id number]  Patient Active Problem List   Diagnosis Date Noted  . Preoperative clearance 05/04/2013  . Rash 02/17/2013  . Sinusitis 01/21/2013  . Headache(784.0) 01/10/2013  . Cough 12/26/2012  . Snoring disorder 12/26/2012  . Sprain of right wrist 06/18/2012  . Irregular menstrual bleeding 11/08/2011  . Diabetes mellitus type 1, uncontrolled, insulin dependent 05/22/2011  . Tobacco abuse 05/22/2011  . Tobacco abuse counseling 05/22/2011  . ABNORMAL EKG 09/10/2009    Subjective:  CC:   Chief Complaint  Patient presents with  . Follow-up    Pre- op EKG abnormal    HPI:   Marcia Maldonadois a 42 y.o. female who presents with Abnormal EKG. Patient was referred by her orthopedic surgeon, Dr Senaida Ores who is planning to do exploratory surgery on  Her right wrist for persistent pain for one year after having  a nondisplaced fracture of the dorsal REM of the radius following a minor fall.  The procedure is going to be done with conscious sedation.  Preoperative  EKG was abnormal so she was referred for evaluation.  She underwent general anesthesia during surgery for ovarian torsion 4 weeks ago  and has had no recent or previous episodes of chest pain. She  is a type I diabetic with typically uncontrolled blood sugars despite use of an insulin pump. She does continue to smoke. She has no history of hypertension.  Her diabetes is managed by endocrinology.   Past Medical History  Diagnosis Date  . Vulvar lesion   . Myocardial infarction 1997    At age 44  . Diabetes mellitus   . History of HPV infection   . Heart murmur   . Hay fever     Allergies  . Vaginal cancer   . Thyroid disease   . Hypercholesterolemia   . Hypothyroidism   . Tobacco abuse     Past Surgical History  Procedure Laterality Date  . Ankle surgery  2010  . Tubal ligation  2004  . Tubal ligation  2004, [facility]  . Abdominal hysterectomy   10/12/12       The following portions of the patient's history were reviewed and updated as appropriate: Allergies, current medications, and problem list.    Review of Systems:   12 Pt  review of systems was negative except those addressed in the HPI,     History   Social History  . Marital Status: Divorced    Spouse Name: N/A    Number of Children: N/A  . Years of Education: N/A   Occupational History  . Not on file.   Social History Main Topics  . Smoking status: Current Every Day Smoker -- 1.00 packs/day for 22 years    Types: Cigarettes  . Smokeless tobacco: Never Used  . Alcohol Use: No  . Drug Use: No  . Sexual Activity: Not on file   Other Topics Concern  . Not on file   Social History Narrative   No regular exercise.   Lives with spouse.    Objective:  Filed Vitals:   05/02/13 1005  BP: 138/78  Pulse: 79  Temp: 98.3 F (36.8 C)  Resp: 14     General appearance: alert, cooperative and appears stated age Ears: normal TM's and external ear canals both ears Throat: lips, mucosa, and tongue normal; teeth and gums normal Neck: no adenopathy, no carotid bruit, supple, symmetrical, trachea midline and thyroid not  enlarged, symmetric, no tenderness/mass/nodules Back: symmetric, no curvature. ROM normal. No CVA tenderness. Lungs: clear to auscultation bilaterally Heart: regular rate and rhythm, S1, S2 normal, no murmur, click, rub or gallop Abdomen: soft, non-tender; bowel sounds normal; no masses,  no organomegaly Pulses: 2+ and symmetric Skin: Skin color, texture, turgor normal. No rashes or lesions Lymph nodes: Cervical, supraclavicular, and axillary nodes normal. Foot exam:  Nails are well trimmed,  No callouses,  Sensation intact to microfilament   Assessment and Plan:  ABNORMAL EKG She states that she was told she had a heart attack years ago bc she had an abnormal EKG.  Patient was evaluated for CAD with a stress test in 2011 for abnormal  EKG preoperatively and no ischemia was found.  She has no history of chest pain, palpitations or exertional dyspnea, although she has multiple CRFs including DM and tobacco abuse.  She underwent surgery with general anesthesia 4 weeks ago with no perioperative complications. repeat EKG todayis unchanged.  There is no indication for preoperative testing however I do  recommend low dose beta blocker.   Preoperative clearance Patient has an abnormal baseline EKG and multiple risk factors for coronary artery disease including uncontrolled diabetes and continued tobacco abuse. However she has  size or symptoms of  peripheral vascular disease oroccult heart disease and underwent general anesthesia 4 weeks ago for GYN surgery with no perioperative complications. Given her multiple risk factors I do recommend that she received  perioperative beta-blockade if her blood pressure will tolerate Lopressor  Right radial fracture Secondary to minor fall in October 2013. Patient was referred to [facility] orthopedics and placed in a cast for a room nondisplaced fracture of the right radial rim. She's had persistent pain for the last year and her current orthopedist as recommended surgical exploration. This would be done under local anesthesia with conscious sedation  Tobacco abuse counseling She date continues tobacco every visit. I specifically addressed her ability to heal given her upcoming surgery and her prior surgery. She is contemplative. She has been given Chantix in the past and actually quit for a few months in July of 2013 but has resumed smoking due to her increased anxiety and emotional stressors.   Tobacco abuse She quit for period time in July of 2013 with the use of Chantix but has resumed smoking. She's not interested in another trial of  Chantix.   Updated Medication List Outpatient Encounter Prescriptions as of 05/02/2013  Medication Sig Dispense Refill  . ALPRAZolam (XANAX) 0.5 MG tablet Take 1  tablet (0.5 mg total) by mouth at bedtime as needed for sleep.  30 tablet  3  . aspirin EC 81 MG tablet Take 1 tablet (81 mg total) by mouth daily.  30 tablet  11  . Cholecalciferol (VITAMIN D3) 50000 UNITS CAPS Take 50,000 capsules by mouth once a week.  4 capsule  2  . citalopram (CELEXA) 10 MG tablet TAKE 1 TABLET BY MOUTH EVERY DAY  30 tablet  2  . diclofenac (VOLTAREN) 75 MG EC tablet Take 75 mg by mouth 2 (two) times daily.        Marland Kitchen HYDROcodone-acetaminophen (NORCO/VICODIN) 5-325 MG per tablet TAKE 1 TABLET BY MOUTH EVERY 6 HOURS AS NEEDED FOR PAIN  30 tablet  0  . Insulin Infusion Pump Supplies (PARADIGM QUICK-SET 18" ) MISC 1 Device by Does not apply route every 3 (three) days.      . Insulin Infusion Pump Supplies (PARADIGM RESERVOIR ) MISC 1 Device by  Does not apply route every 3 (three) days.      . insulin lispro (HUMALOG) 100 UNIT/ML injection Used as ordered for Insulin pump, total of 60 units per day.      . levothyroxine (SYNTHROID, LEVOTHROID) 112 MCG tablet Take 1 tablet (112 mcg total) by mouth daily.  30 tablet  10  . lisinopril (PRINIVIL,ZESTRIL) 2.5 MG tablet Take 1 tablet (2.5 mg total) by mouth daily.  90 tablet  1  . NON FORMULARY Humalog Insulin Pump.       . simvastatin (ZOCOR) 40 MG tablet Take 40 mg by mouth every evening.      Marland Kitchen tiZANidine (ZANAFLEX) 4 MG tablet TAKE ONE TABLET BY MOUTH 3 TIMES A DAY AS NEEDED FOR MUSCLE SPASMS  90 tablet  1  . diazepam (VALIUM) 2 MG tablet Take 1 tablet (2 mg total) by mouth every 12 (twelve) hours as needed for anxiety.  60 tablet  3  . hydrocortisone 2.5 % lotion Apply topically 2 (two) times daily. Apply to affected area twice daily.  59 mL  0  . metoprolol tartrate (LOPRESSOR) 25 MG tablet Take 1 tablet (25 mg total) by mouth 2 (two) times daily.  180 tablet  3  . predniSONE (DELTASONE) 10 MG tablet Take 60 mg (6 tablets) on the first day and then taper by 10 mg (1 tablet) daily until done.  21 tablet  0  . [DISCONTINUED]  varenicline (CHANTIX STARTING MONTH PAK) 0.5 MG X 11 & 1 MG X 42 tablet Take one 0.5 mg tablet by mouth once daily for 3 days, then increase to one 0.5 mg tablet twice daily for 4 days, then increase to one 1 mg tablet twice daily.  53 tablet  0   No facility-administered encounter medications on file as of 05/02/2013.

## 2013-05-04 DIAGNOSIS — Z01818 Encounter for other preprocedural examination: Secondary | ICD-10-CM | POA: Insufficient documentation

## 2013-05-04 NOTE — Assessment & Plan Note (Signed)
She quit for period time in July of 2013 with the use of Chantix but has resumed smoking. She's not interested in another trial of  Chantix.

## 2013-05-04 NOTE — Assessment & Plan Note (Signed)
Secondary to minor fall in October 2013. Patient was referred to [facility] orthopedics and placed in a cast for a room nondisplaced fracture of the right radial rim. She's had persistent pain for the last year and her current orthopedist as recommended surgical exploration. This would be done under local anesthesia with conscious sedation

## 2013-05-04 NOTE — Assessment & Plan Note (Signed)
Patient has an abnormal baseline EKG and multiple risk factors for coronary artery disease including uncontrolled diabetes and continued tobacco abuse. However she has  size or symptoms of  peripheral vascular disease oroccult heart disease and underwent general anesthesia 4 weeks ago for GYN surgery with no perioperative complications. Given her multiple risk factors I do recommend that she received  perioperative beta-blockade if her blood pressure will tolerate Lopressor

## 2013-05-04 NOTE — Assessment & Plan Note (Addendum)
She date continues tobacco every visit. I specifically addressed her ability to heal given her upcoming surgery and her prior surgery. She is contemplative. She has been given Chantix in the past and actually quit for a few months in July of 2013 but has resumed smoking due to her increased anxiety and emotional stressors.

## 2013-05-05 ENCOUNTER — Telehealth: Payer: Self-pay | Admitting: Endocrinology

## 2013-05-05 NOTE — Telephone Encounter (Signed)
please call patient: F/u of is due

## 2013-05-05 NOTE — Telephone Encounter (Signed)
Pt advised and appt made for 10:45 on Friday

## 2013-05-09 ENCOUNTER — Ambulatory Visit: Payer: Medicaid Other | Admitting: Endocrinology

## 2013-05-11 ENCOUNTER — Other Ambulatory Visit: Payer: Self-pay | Admitting: Internal Medicine

## 2013-05-12 ENCOUNTER — Other Ambulatory Visit: Payer: Self-pay | Admitting: *Deleted

## 2013-05-12 NOTE — Telephone Encounter (Signed)
Patient needs refill on XANAX 0.25 mg please advise.

## 2013-05-13 ENCOUNTER — Ambulatory Visit: Payer: Medicaid Other | Admitting: Endocrinology

## 2013-05-13 ENCOUNTER — Other Ambulatory Visit: Payer: Self-pay | Admitting: *Deleted

## 2013-05-13 DIAGNOSIS — E039 Hypothyroidism, unspecified: Secondary | ICD-10-CM

## 2013-05-13 MED ORDER — LEVOTHYROXINE SODIUM 112 MCG PO TABS: 112.0000 ug | ORAL_TABLET | Freq: Every day | ORAL | Status: DC

## 2013-05-13 NOTE — Telephone Encounter (Signed)
Ok to refill Xanax? And, pt sees Dr. Everardo All for diabetes, requesting Humalog for pump, do you refill or request needs to go to Dr. Everardo All?

## 2013-05-14 MED ORDER — ALPRAZOLAM 0.5 MG PO TABS: 0.5000 mg | ORAL_TABLET | Freq: Every evening | ORAL | Status: DC | PRN

## 2013-05-14 MED ORDER — INSULIN LISPRO 100 UNIT/ML ~~LOC~~ SOLN: SUBCUTANEOUS | Status: DC

## 2013-05-14 NOTE — Telephone Encounter (Signed)
Refills done . In the future, insulin refills need to go to [facility]

## 2013-05-14 NOTE — Telephone Encounter (Signed)
Ok to refill,  printed rx  

## 2013-05-15 NOTE — Telephone Encounter (Signed)
Script faxed to pharmacy as requested. 

## 2013-05-15 NOTE — Telephone Encounter (Signed)
Rx faxed to pharmacy  

## 2013-05-16 NOTE — Telephone Encounter (Signed)
Patient notified script ready for pick up and one faxed to pharmacy

## 2013-05-19 ENCOUNTER — Encounter: Payer: Self-pay | Admitting: Endocrinology

## 2013-05-19 ENCOUNTER — Ambulatory Visit (INDEPENDENT_AMBULATORY_CARE_PROVIDER_SITE_OTHER): Payer: Medicaid Other | Admitting: Endocrinology

## 2013-05-19 VITALS — BP 126/70 | HR 100 | Ht 65.0 in | Wt 136.0 lb

## 2013-05-19 DIAGNOSIS — E1065 Type 1 diabetes mellitus with hyperglycemia: Secondary | ICD-10-CM

## 2013-05-19 NOTE — Patient Instructions (Addendum)
check your blood sugar 4 times a day: before the 3 meals, and at bedtime.  also check if you have symptoms of your blood sugar being too high or too low.  please keep a record of the readings and bring it to your next appointment here.  You can write it on any piece of paper.  please call us sooner if your blood sugar goes below 70, or if you have a lot of readings over 200.   Please reduce your basal rate to 0.9 unit/hr, 24 hrs per day.   Please increase your mealtime bolus to 1 unit/5 grams of carbohydrate.   continue correction bolus (which some people call "sensitivity," or "insulin sensitivity ratio," or just "isr") of 1 unit for each 40 by which your glucose exceeds 100 Please come back for a follow-up appointment in 3 months.

## 2013-05-19 NOTE — Progress Notes (Signed)
Subjective:    Patient ID: Marcia [name], female    DOB: 1971-03-22, 43 y.o.   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 She takes a total of approx 45 units total per day.  She has no cbg record.  She has mild hypoglycemia in the middle of the night, approximately twice a month.  She says cbg's are highest in the middle of the day.  Past Medical History  Diagnosis Date  . Vulvar lesion   . Myocardial infarction 1997    At age 34  . Diabetes mellitus   . History of HPV infection   . Heart murmur   . Hay fever     Allergies  . Vaginal cancer   . Thyroid disease   . Hypercholesterolemia   . Hypothyroidism   . Tobacco abuse     Past Surgical History  Procedure Laterality Date  . Ankle surgery  2010  . Tubal ligation  2004  . Tubal ligation  2004, [facility]  . Abdominal hysterectomy  10/12/12    History   Social History  . Marital Status: Divorced    Spouse Name: N/A    Number of Children: N/A  . Years of Education: N/A   Occupational History  . Not on file.   Social History Main Topics  . Smoking status: Current Every Day Smoker -- 1.00 packs/day for 22 years    Types: Cigarettes  . Smokeless tobacco: Never Used  . Alcohol Use: No  . Drug Use: No  . Sexual Activity: Not on file   Other Topics Concern  . Not on file   Social History Narrative   No regular exercise.   Lives with spouse.    Current Outpatient Prescriptions on File Prior to Visit  Medication Sig Dispense Refill  . ALPRAZolam (XANAX) 0.5 MG tablet Take 1 tablet (0.5 mg total) by mouth at bedtime as needed for sleep.  30 tablet  3  . aspirin EC 81 MG tablet Take  1 tablet (81 mg total) by mouth daily.  30 tablet  11  . Cholecalciferol (VITAMIN D3) 50000 UNITS CAPS Take 50,000 capsules by mouth once a week.  4 capsule  2  . Insulin Infusion Pump Supplies (PARADIGM QUICK-SET 18" ) MISC 1 Device by Does not apply route every 3 (three) days.      . Insulin Infusion Pump Supplies (PARADIGM RESERVOIR ) MISC 1 Device by Does not apply route every 3 (three) days.      . insulin lispro (HUMALOG) 100 UNIT/ML injection Used as ordered for Insulin pump, total of 60 units per day.  10 mL  4  . levothyroxine (SYNTHROID, LEVOTHROID) 112 MCG tablet Take 1 tablet (112 mcg total) by mouth daily.  30 tablet  7  . metoprolol tartrate (LOPRESSOR) 25 MG tablet Take 1 tablet (25 mg total) by mouth 2 (two) times daily.  180 tablet  3  . NON FORMULARY Humalog Insulin Pump.       . simvastatin (ZOCOR) 40 MG tablet Take 40 mg by mouth every evening.      Marland Kitchen tiZANidine (ZANAFLEX) 4 MG tablet TAKE ONE TABLET BY  MOUTH 3 TIMES A DAY AS NEEDED FOR MUSCLE SPASMS  90 tablet  1  . citalopram (CELEXA) 10 MG tablet TAKE 1 TABLET BY MOUTH EVERY DAY  30 tablet  2  . diazepam (VALIUM) 2 MG tablet Take 1 tablet (2 mg total) by mouth every 12 (twelve) hours as needed for anxiety.  60 tablet  3  . lisinopril (PRINIVIL,ZESTRIL) 2.5 MG tablet Take 1 tablet (2.5 mg total) by mouth daily.  90 tablet  1   No current facility-administered medications on file prior to visit.    Allergies  Allergen Reactions  . Chantix [Varenicline] Rash    Family History  Problem Relation Age of Onset  . Cancer Other     Aunt-breast; grandmother- lung and ovarian  . Diabetes Father   . Hypertension Mother   . Hyperlipidemia Mother   . Aneurysm Brother    BP 126/70  Pulse 100  Ht 5\' 5"  (1.651 m)  Wt 136 lb (61.689 kg)  BMI 22.63 kg/m2  SpO2 97%  LMP 10/12/2012  Review of Systems Denies LOC and weight change    Objective:   Physical Exam VITAL SIGNS:  See vs page GENERAL: no distress  Lab  Results  Component Value Date   HGBA1C 9.5* 05/02/2013      Assessment & Plan:  Type 1 DM: This insulin pump regimen was chosen from multiple options, as it best matches her insulin to her changing requirements throughout the day.  The benefits of glycemic control must be weighed against the risks of hypoglycemia.  based on the pattern of her reported cbg's, she needs less basal and more bolus insulin.  Noncompliance with cbg recording: this limits the rx of DM.  i'll do the best i can.

## 2013-05-22 ENCOUNTER — Encounter: Payer: Self-pay | Admitting: Emergency Medicine

## 2013-05-23 DIAGNOSIS — M24139 Other articular cartilage disorders, unspecified wrist: Secondary | ICD-10-CM | POA: Insufficient documentation

## 2013-06-06 ENCOUNTER — Other Ambulatory Visit: Payer: Self-pay | Admitting: Internal Medicine

## 2013-06-18 ENCOUNTER — Encounter: Payer: Self-pay | Admitting: *Deleted

## 2013-06-19 ENCOUNTER — Ambulatory Visit: Payer: Medicaid Other | Admitting: Internal Medicine

## 2013-06-22 ENCOUNTER — Other Ambulatory Visit: Payer: Self-pay | Admitting: Internal Medicine

## 2013-06-30 ENCOUNTER — Encounter: Payer: Self-pay | Admitting: *Deleted

## 2013-07-01 ENCOUNTER — Ambulatory Visit (INDEPENDENT_AMBULATORY_CARE_PROVIDER_SITE_OTHER): Payer: Medicaid Other | Admitting: Internal Medicine

## 2013-07-01 ENCOUNTER — Encounter: Payer: Self-pay | Admitting: Internal Medicine

## 2013-07-01 VITALS — BP 112/72 | HR 65 | Temp 98.6°F | Resp 12 | Ht 62.0 in | Wt 136.5 lb

## 2013-07-01 DIAGNOSIS — Z8639 Personal history of other endocrine, nutritional and metabolic disease: Secondary | ICD-10-CM | POA: Insufficient documentation

## 2013-07-01 DIAGNOSIS — Z862 Personal history of diseases of the blood and blood-forming organs and certain disorders involving the immune mechanism: Secondary | ICD-10-CM

## 2013-07-01 DIAGNOSIS — R0683 Snoring: Secondary | ICD-10-CM

## 2013-07-01 DIAGNOSIS — Z716 Tobacco abuse counseling: Secondary | ICD-10-CM

## 2013-07-01 DIAGNOSIS — N63 Unspecified lump in unspecified breast: Secondary | ICD-10-CM

## 2013-07-01 DIAGNOSIS — R0609 Other forms of dyspnea: Secondary | ICD-10-CM

## 2013-07-01 DIAGNOSIS — N6320 Unspecified lump in the left breast, unspecified quadrant: Secondary | ICD-10-CM | POA: Insufficient documentation

## 2013-07-01 DIAGNOSIS — R599 Enlarged lymph nodes, unspecified: Secondary | ICD-10-CM

## 2013-07-01 DIAGNOSIS — Z7189 Other specified counseling: Secondary | ICD-10-CM

## 2013-07-01 DIAGNOSIS — R590 Localized enlarged lymph nodes: Secondary | ICD-10-CM

## 2013-07-01 DIAGNOSIS — F17200 Nicotine dependence, unspecified, uncomplicated: Secondary | ICD-10-CM

## 2013-07-01 NOTE — Patient Instructions (Signed)
Referral for diagnositc mammogram to be set up today    Smoking Cessation, Tips for Success YOU CAN QUIT SMOKING If you are ready to quit smoking, congratulations! You have chosen to help yourself be healthier. Cigarettes bring nicotine, tar, carbon monoxide, and other irritants into your body. Your lungs, heart, and blood vessels will be able to work better without these poisons. There are many different ways to quit smoking. Nicotine gum, nicotine patches, a nicotine inhaler, or nicotine nasal spray can help with physical craving. Hypnosis, support groups, and medicines help break the habit of smoking. Here are some tips to help you quit for good.  Throw away all cigarettes.  Clean and remove all ashtrays from your home, work, and car.  On a card, write down your reasons for quitting. Carry the card with you and read it when you get the urge to smoke.  Cleanse your body of nicotine. Drink enough water and fluids to keep your urine clear or pale yellow. Do this after quitting to flush the nicotine from your body.  Learn to predict your moods. Do not let a bad situation be your excuse to have a cigarette. Some situations in your life might tempt you into wanting a cigarette.  Never have "just one" cigarette. It leads to wanting another and another. Remind yourself of your decision to quit.  Change habits associated with smoking. If you smoked while driving or when feeling stressed, try other activities to replace smoking. Stand up when drinking your coffee. Brush your teeth after eating. Sit in a different chair when you read the paper. Avoid alcohol while trying to quit, and try to drink fewer caffeinated beverages. Alcohol and caffeine may urge you to smoke.  Avoid foods and drinks that can trigger a desire to smoke, such as sugary or spicy foods and alcohol.  Ask people who smoke not to smoke around you.  Have something planned to do right after eating or having a cup of coffee. Take a  walk or exercise to perk you up. This will help to keep you from overeating.  Try a relaxation exercise to calm you down and decrease your stress. Remember, you may be tense and nervous for the first 2 weeks after you quit, but this will pass.  Find new activities to keep your hands busy. Play with a pen, coin, or rubber band. Doodle or draw things on paper.  Brush your teeth right after eating. This will help cut down on the craving for the taste of tobacco after meals. You can try mouthwash, too.  Use oral substitutes, such as lemon drops, carrots, a cinnamon stick, or chewing gum, in place of cigarettes. Keep them handy so they are available when you have the urge to smoke.  When you have the urge to smoke, try deep breathing.  Designate your home as a nonsmoking area.  If you are a heavy smoker, ask your caregiver about a prescription for nicotine chewing gum. It can ease your withdrawal from nicotine.  Reward yourself. Set aside the cigarette money you save and buy yourself something nice.  Look for support from others. Join a support group or smoking cessation program. Ask someone at home or at work to help you with your plan to quit smoking.  Always ask yourself, "Do I need this cigarette or is this just a reflex?" Tell yourself, "Today, I choose not to smoke," or "I do not want to smoke." You are reminding yourself of your decision to quit,  even if you do smoke a cigarette. HOW WILL I FEEL WHEN I QUIT SMOKING?  The benefits of not smoking start within days of quitting.  You may have symptoms of withdrawal because your body is used to nicotine (the addictive substance in cigarettes). You may crave cigarettes, be irritable, feel very hungry, cough often, get headaches, or have difficulty concentrating.  The withdrawal symptoms are only temporary. They are strongest when you first quit but will go away within 10 to 14 days.  When withdrawal symptoms occur, stay in control. Think  about your reasons for quitting. Remind yourself that these are signs that your body is healing and getting used to being without cigarettes.  Remember that withdrawal symptoms are easier to treat than the major diseases that smoking can cause.  Even after the withdrawal is over, expect periodic urges to smoke. However, these cravings are generally short-lived and will go away whether you smoke or not. Do not smoke!  If you relapse and smoke again, do not lose hope. Most smokers quit 3 times before they are successful.  If you relapse, do not give up! Plan ahead and think about what you will do the next time you get the urge to smoke. LIFE AS A NONSMOKER: MAKE IT FOR A MONTH, MAKE IT FOR LIFE Day 1: Hang this page where you will see it every day. Day 2: Get rid of all ashtrays, matches, and lighters. Day 3: Drink water. Breathe deeply between sips. Day 4: Avoid places with smoke-filled air, such as bars, clubs, or the smoking section of restaurants. Day 5: Keep track of how much money you save by not smoking. Day 6: Avoid boredom. Keep a good book with you or go to the movies. Day 7: Reward yourself! One week without smoking! Day 8: Make a dental appointment to get your teeth cleaned. Day 9: Decide how you will turn down a cigarette before it is offered to you. Day 10: Review your reasons for quitting. Day 11: Distract yourself. Stay active to keep your mind off smoking and to relieve tension. Take a walk, exercise, read a book, do a crossword puzzle, or try a new hobby. Day 12: Exercise. Get off the bus before your stop or use stairs instead of escalators. Day 13: Call on friends for support and encouragement. Day 14: Reward yourself! Two weeks without smoking! Day 15: Practice deep breathing exercises. Day 16: Bet a friend that you can stay a nonsmoker. Day 17: Ask to sit in nonsmoking sections of restaurants. Day 18: Hang up "No Smoking" signs. Day 19: Think of yourself as a  nonsmoker. Day 20: Each morning, tell yourself you will not smoke. Day 21: Reward yourself! Three weeks without smoking! Day 22: Think of smoking in negative ways. Remember how it stains your teeth, gives you bad breath, and leaves you short of breath. Day 23: Eat a nutritious breakfast. Day 24:Do not relive your days as a smoker. Day 25: Hold a pencil in your hand when talking on the telephone. Day 26: Tell all your friends you do not smoke. Day 27: Think about how much better food tastes. Day 28: Remember, one cigarette is one too many. Day 29: Take up a hobby that will keep your hands busy. Day 30: Congratulations! One month without smoking! Give yourself a big reward. Your caregiver can direct you to community resources or hospitals for support, which may include:  Group support.  Education.  Hypnosis.  Subliminal therapy. Document Released: 04/21/2004 Document  Revised: 10/16/2011 Document Reviewed: 01/09/2013 [facility] Patient Information 2014 [place], [place].

## 2013-07-01 NOTE — Progress Notes (Signed)
Pre-visit discussion using our clinic review tool. No additional management support is needed unless otherwise documented below in the visit note.  

## 2013-07-01 NOTE — Progress Notes (Signed)
Patient ID: Marcia [name], female   DOB: 1970-08-17, 42 y.o.   MRN: [id number]  Patient Active Problem List   Diagnosis Date Noted  . History of hyperthyroidism 07/01/2013  . Mass of breast, left 07/01/2013  . Preoperative clearance 05/04/2013  . Snoring disorder 12/26/2012  . Right radial fracture 06/18/2012  . Diabetes mellitus type 1, uncontrolled, insulin dependent 05/22/2011  . Tobacco abuse 05/22/2011  . Tobacco abuse counseling 05/22/2011  . ABNORMAL EKG 09/10/2009    Subjective:  CC:   Chief Complaint  Patient presents with  . Breast Problem    pat.ient feels lump in left breast  . Neck Pain    thyroid area aching type pain throbs at night.    HPI:   Marcia [name] a 42 y.o. female who presents as an Acute visit for multiple complaints  Left upper outer quadrant tenderness and swelling for the past week,  No history of scalp or sinus infection recently.  Last mammogram was normal in February.,    Right cervical fullness and tenderness.  Occasionally has difficulty swallowing large pills.  Was treted for hyperthyroidism s/p ablation in 2000 by [facility],  Has been on Synthroid ever since.  Last TSH < 3.0 in May.   Takes synthroid appropriately  Tobacco abuse:  Has tried chantix, which caused a diffuse rash. Prior trial of wellbutrin made her depressed,  boyfriend smokes cigarettes as well, both want to quit,    Anxiety :  Generalized, chronic, using diazepam and alprazolam . Drug contract:  Needs to sign this today  To continue receiving dizaepam and alprazolam      Past Medical History  Diagnosis Date  . Vulvar lesion   . Myocardial infarction 1997    At age 67  . Diabetes mellitus   . History of HPV infection   . Heart murmur   . Hay fever     Allergies  . Vaginal cancer   . Thyroid disease   . Hypercholesterolemia   . Hypothyroidism   . Tobacco abuse     Past Surgical History  Procedure Laterality Date  . Ankle surgery  2010  . Tubal  ligation  2004  . Tubal ligation  2004, [facility]  . Abdominal hysterectomy  10/12/12       The following portions of the patient's history were reviewed and updated as appropriate: Allergies, current medications, and problem list.    Review of Systems:   12 Pt  review of systems was negative except those addressed in the HPI,     History   Social History  . Marital Status: Divorced    Spouse Name: N/A    Number of Children: N/A  . Years of Education: N/A   Occupational History  . Not on file.   Social History Main Topics  . Smoking status: Current Every Day Smoker -- 1.00 packs/day for 22 years    Types: Cigarettes  . Smokeless tobacco: Never Used  . Alcohol Use: No  . Drug Use: No  . Sexual Activity: Yes   Other Topics Concern  . Not on file   Social History Narrative   No regular exercise.   Lives with spouse.    Objective:  Filed Vitals:   07/01/13 0855  BP: 112/72  Pulse: 65  Temp: 98.6 F (37 C)  Resp: 12     General appearance: alert, cooperative and appears stated age Ears: normal TM's and external ear canals both ears Throat: lips, mucosa, and tongue normal; teeth and  gums normal Neck: mild right sided adenopathy, no carotid bruit, supple, symmetrical, trachea midline and thyroid not enlarged, symmetric, no tenderness/mass/nodules Back: symmetric, no curvature. ROM normal. No CVA tenderness. Lungs: clear to auscultation bilaterally Breast:  LUO quadrant is firm and tender,  Asymmetry of breasts noted in this region Heart: regular rate and rhythm, S1, S2 normal, no murmur, click, rub or gallop Abdomen: soft, non-tender; bowel sounds normal; no masses,  no organomegaly Pulses: 2+ and symmetric Skin: heavily tatooed.  Skin color, texture, turgor normal. No rashes or lesions Lymph nodes: Cervical, supraclavicular, and axillary nodes normal.  Assessment and Plan:  Mass of breast, left Diagnostic mammogram ordered of left breast for patient  discovered assymetric firmness in LUO quadrant.   History of hyperthyroidism With recent onset of tenderness in right cervical region.  Will recheck TSH and ultrasound of neck.   Tobacco abuse counseling Smoking cessation instruction/counseling given:  counseled patient on the dangers of tobacco use, advised patient to stop smoking, and reviewed strategies to maximize success.  She is motivated to quit but has had prior adverse effects from both Chantix and Wellbutrin. Suggested reducing quantity of cigaretttes each week by 1 daily   Snoring disorder No signs of OSA by sleep study done May 2014.  Sinus congestion from smoking discussed as cause.   Updated Medication List Outpatient Encounter Prescriptions as of 07/01/2013  Medication Sig  . ALPRAZolam (XANAX) 0.5 MG tablet Take 1 tablet (0.5 mg total) by mouth at bedtime as needed for sleep.  Marland Kitchen aspirin EC 81 MG tablet Take 1 tablet (81 mg total) by mouth daily.  . Cholecalciferol (VITAMIN D3) 50000 UNITS CAPS Take 50,000 capsules by mouth once a week.  . citalopram (CELEXA) 10 MG tablet TAKE 1 TABLET BY MOUTH EVERY DAY  . diazepam (VALIUM) 2 MG tablet Take 1 tablet (2 mg total) by mouth every 12 (twelve) hours as needed for anxiety.  Marland Kitchen estradiol (VIVELLE-DOT) 0.05 MG/24HR patch Place 1 patch onto the skin once a week.  . Insulin Infusion Pump Supplies (PARADIGM QUICK-SET 18" ) MISC 1 Device by Does not apply route every 3 (three) days.  . Insulin Infusion Pump Supplies (PARADIGM RESERVOIR ) MISC 1 Device by Does not apply route every 3 (three) days.  . insulin lispro (HUMALOG) 100 UNIT/ML injection Used as ordered for Insulin pump, total of 60 units per day.  . levothyroxine (SYNTHROID, LEVOTHROID) 112 MCG tablet Take 1 tablet (112 mcg total) by mouth daily.  Marland Kitchen lisinopril (PRINIVIL,ZESTRIL) 2.5 MG tablet Take 1 tablet (2.5 mg total) by mouth daily.  . metoprolol tartrate (LOPRESSOR) 25 MG tablet Take 1 tablet (25 mg total) by mouth 2  (two) times daily.  . NON FORMULARY Humalog Insulin Pump.   . simvastatin (ZOCOR) 40 MG tablet Take 40 mg by mouth every evening.  Marland Kitchen tiZANidine (ZANAFLEX) 4 MG tablet TAKE ONE TABLET BY MOUTH 3 TIMES A DAY AS NEEDED FOR MUSCLE SPASMS  . [DISCONTINUED] citalopram (CELEXA) 10 MG tablet TAKE 1 TABLET BY MOUTH EVERY DAY     Orders Placed This Encounter  Procedures  . MM Digital Diagnostic Unilat L  . TSH + free T4  . CBC with Differential    No Follow-up on file.

## 2013-07-02 ENCOUNTER — Telehealth: Payer: Self-pay | Admitting: Emergency Medicine

## 2013-07-02 LAB — TSH+FREE T4: Free T4: 1.46 ng/dL (ref 0.82–1.77)

## 2013-07-02 NOTE — Telephone Encounter (Signed)
Per [employer] who pre-certs for Medicaid of [place]. Uni L mammogram does not require pre-cert. U/S of breast was approved with Auth # [id number].

## 2013-07-03 ENCOUNTER — Encounter: Payer: Self-pay | Admitting: Internal Medicine

## 2013-07-03 NOTE — Assessment & Plan Note (Signed)
With recent onset of tenderness in right cervical region.  Will recheck TSH and ultrasound of neck.

## 2013-07-03 NOTE — Assessment & Plan Note (Signed)
Diagnostic mammogram ordered of left breast for patient discovered assymetric firmness in LUO quadrant.

## 2013-07-03 NOTE — Assessment & Plan Note (Signed)
No signs of OSA by sleep study done May 2014.  Sinus congestion from smoking discussed as cause.

## 2013-07-03 NOTE — Assessment & Plan Note (Signed)
Smoking cessation instruction/counseling given:  counseled patient on the dangers of tobacco use, advised patient to stop smoking, and reviewed strategies to maximize success.  She is motivated to quit but has had prior adverse effects from both Chantix and Wellbutrin. Suggested reducing quantity of cigaretttes each week by 1 daily    

## 2013-07-07 ENCOUNTER — Telehealth: Payer: Self-pay | Admitting: Internal Medicine

## 2013-07-07 ENCOUNTER — Ambulatory Visit: Payer: Self-pay | Admitting: Internal Medicine

## 2013-07-07 DIAGNOSIS — N6320 Unspecified lump in the left breast, unspecified quadrant: Secondary | ICD-10-CM

## 2013-07-07 NOTE — Assessment & Plan Note (Signed)
Her breast ultrasound and mammogram showed no masses or worrisome calcifications.   

## 2013-07-07 NOTE — Telephone Encounter (Signed)
Her breast ultrasound and mammogram showed no masses or worrisome calcifications.

## 2013-07-07 NOTE — Telephone Encounter (Signed)
[place] states pt has appt for uni left mammo due to new breast mass.  Pt had last mammo 1/14, so is within a month of needing yearly scheduled.  Asking for new order for "bilateral diagnostic mammogram for new left breast mass @ 3 o'clock".  Pt also needs order for "Left breast US for new left breast mass @ 3 o'clock".

## 2013-07-07 NOTE — Telephone Encounter (Signed)
Please fax to [place] [phone]

## 2013-07-08 NOTE — Telephone Encounter (Signed)
Left voicemail for return call  

## 2013-07-09 ENCOUNTER — Telehealth: Payer: Self-pay | Admitting: Internal Medicine

## 2013-07-09 DIAGNOSIS — F419 Anxiety disorder, unspecified: Secondary | ICD-10-CM | POA: Insufficient documentation

## 2013-07-09 NOTE — Telephone Encounter (Signed)
Left detailed message per DPR  

## 2013-07-09 NOTE — Telephone Encounter (Signed)
Please ask patient when her last use of alprazolam or diazepam was.

## 2013-07-09 NOTE — Telephone Encounter (Signed)
Left message for patient to return my call.

## 2013-07-10 NOTE — Telephone Encounter (Signed)
Diazepam was last taken 2 weeks ago and alprazolam every day patient stated she takes this medication.

## 2013-07-10 NOTE — Telephone Encounter (Signed)
Left message with family member for patient to call office

## 2013-07-12 LAB — HM MAMMOGRAPHY: HM Mammogram: NORMAL

## 2013-07-18 ENCOUNTER — Other Ambulatory Visit: Payer: Self-pay | Admitting: Internal Medicine

## 2013-07-18 NOTE — Telephone Encounter (Signed)
Okay to fill? 

## 2013-07-22 ENCOUNTER — Encounter: Payer: Self-pay | Admitting: Family Medicine

## 2013-07-22 ENCOUNTER — Ambulatory Visit (INDEPENDENT_AMBULATORY_CARE_PROVIDER_SITE_OTHER): Payer: Medicaid Other | Admitting: Family Medicine

## 2013-07-22 ENCOUNTER — Telehealth: Payer: Self-pay | Admitting: Internal Medicine

## 2013-07-22 VITALS — BP 136/80 | HR 77 | Temp 99.8°F | Wt 135.0 lb

## 2013-07-22 DIAGNOSIS — J209 Acute bronchitis, unspecified: Secondary | ICD-10-CM

## 2013-07-22 MED ORDER — CEFTRIAXONE SODIUM 1 G IJ SOLR
1.0000 g | Freq: Once | INTRAMUSCULAR | Status: AC
  Administered 2013-07-22: 1 g via INTRAMUSCULAR

## 2013-07-22 MED ORDER — BENZONATATE 200 MG PO CAPS: 200.0000 mg | ORAL_CAPSULE | Freq: Three times a day (TID) | ORAL | Status: DC | PRN

## 2013-07-22 MED ORDER — OMEPRAZOLE 20 MG PO CPDR: 20.0000 mg | DELAYED_RELEASE_CAPSULE | Freq: Every day | ORAL | Status: DC

## 2013-07-22 MED ORDER — AZITHROMYCIN 500 MG PO TABS: 500.0000 mg | ORAL_TABLET | Freq: Every day | ORAL | Status: DC

## 2013-07-22 NOTE — Telephone Encounter (Signed)
FYI-pt being seen at [place] office today

## 2013-07-22 NOTE — Progress Notes (Signed)
Pre-visit discussion using our clinic review tool. No additional management support is needed unless otherwise documented below in the visit note.  

## 2013-07-22 NOTE — Patient Instructions (Signed)
Nice to meet you Gave you an injection of antibiotic today Take azithromycin 1 tab daily for 3 days Tessalon Perles for cough Prilosec for reflux Teaspoon of honey before bed can help with the cough Come back in 1 week if not better or worse.   Bronchitis Bronchitis is the body's way of reacting to injury and/or infection (inflammation) of the bronchi. Bronchi are the air tubes that extend from the windpipe into the lungs. If the inflammation becomes severe, it may cause shortness of breath. CAUSES  Inflammation may be caused by:  A virus.  Germs (bacteria).  Dust.  Allergens.  Pollutants and many other irritants. The cells lining the bronchial tree are covered with tiny hairs (cilia). These constantly beat upward, away from the lungs, toward the mouth. This keeps the lungs free of pollutants. When these cells become too irritated and are unable to do their job, mucus begins to develop. This causes the characteristic cough of bronchitis. The cough clears the lungs when the cilia are unable to do their job. Without either of these protective mechanisms, the mucus would settle in the lungs. Then you would develop pneumonia. Smoking is a common cause of bronchitis and can contribute to pneumonia. Stopping this habit is the single most important thing you can do to help yourself. TREATMENT   Your caregiver may prescribe an antibiotic if the cough is caused by bacteria. Also, medicines that open up your airways make it easier to breathe. Your caregiver may also recommend or prescribe an expectorant. It will loosen the mucus to be coughed up. Only take over-the-counter or prescription medicines for pain, discomfort, or fever as directed by your caregiver.  Removing whatever causes the problem (smoking, for example) is critical to preventing the problem from getting worse.  Cough suppressants may be prescribed for relief of cough symptoms.  Inhaled medicines may be prescribed to help with  symptoms now and to help prevent problems from returning.  For those with recurrent (chronic) bronchitis, there may be a need for steroid medicines. SEEK IMMEDIATE MEDICAL CARE IF:   During treatment, you develop more pus-like mucus (purulent sputum).  You have a fever.  You become progressively more ill.  You have increased difficulty breathing, wheezing, or shortness of breath. It is necessary to seek immediate medical care if you are elderly or sick from any other disease. MAKE SURE YOU:   Understand these instructions.  Will watch your condition.  Will get help right away if you are not doing well or get worse. Document Released: 07/24/2005 Document Revised: 03/26/2013 Document Reviewed: 03/18/2013 [facility] Patient Information 2014 [place], [place].

## 2013-07-22 NOTE — Telephone Encounter (Signed)
Patient Information:  Caller Name: Malone  Phone: [phone]  Patient: Marcia [name], Marcia [name]  Gender: Female  DOB: [birth year]  Age: 42 Years  PCP: Duncan Dull (Adults only)  Pregnant: No  Office Follow Up:  Does the office need to follow up with this patient?: No  Instructions For The Office: N/A   Symptoms  Reason For Call & Symptoms: Pt called for an appt. Transferred to the nurse. Pt has cough/congestion x 1 week. No fever. No appts/[place] location. Scheduled at [facility].  Reviewed Health History In EMR: Yes  Reviewed Medications In EMR: Yes  Reviewed Allergies In EMR: Yes  Reviewed Surgeries / Procedures: Yes  Date of Onset of Symptoms: 07/15/2013 OB / GYN:  LMP: Unknown  Guideline(s) Used:  Cough  Disposition Per Guideline:   See Today or Tomorrow in Office  Reason For Disposition Reached:   Continuous (nonstop) coughing interferes with work or school and no improvement using cough treatment per Care Advice  Advice Given:  N/A  Patient Will Follow Care Advice:  YES  Appointment Scheduled:  07/22/2013 11:30:00 Appointment Scheduled Provider:  Duncan Dull (Adults only)

## 2013-07-22 NOTE — Progress Notes (Signed)
SUBJECTIVE:  Marcia [name] is a 42 y.o. female who complains of sore throat, and cough for 8 days. She denies a history of chest pain, dizziness, fatigue, fevers, myalgias, shortness of breath, vomiting, weakness and weight loss. She denies a history of asthma. Patient has and admits to smoke cigarettes. Patient denies any recent travel. Patient states that her cough is productive with some mucous. Patient is feeling somewhat more fatigue than usual but denies any significant fever.  Past medical history, social, surgical and family history all reviewed.     OBJECTIVE: Vitals as noted above. Appearance: alert, well appearing, and in no distress, oriented to person, place, and time, normal appearing weight and well hydrated.  ENT- neck has bilateral anterior cervical nodes enlarged, pharynx erythematous without exudate, post nasal drip noted and nasal mucosa congested.  Chest - rales noted right lower lobe.  ASSESSMENT:  bronchitis  PLAN: Symptomatic therapy suggested: push fluids, gargle warm salt water, return office visit prn if symptoms persist or worsen and medications per orders. Call or return to clinic prn if these symptoms worsen or fail to improve as anticipated.

## 2013-07-23 ENCOUNTER — Encounter: Payer: Self-pay | Admitting: Internal Medicine

## 2013-07-24 ENCOUNTER — Encounter: Payer: Self-pay | Admitting: Internal Medicine

## 2013-07-30 ENCOUNTER — Encounter: Payer: Self-pay | Admitting: Internal Medicine

## 2013-07-30 ENCOUNTER — Encounter: Payer: Self-pay | Admitting: *Deleted

## 2013-07-30 ENCOUNTER — Ambulatory Visit (INDEPENDENT_AMBULATORY_CARE_PROVIDER_SITE_OTHER)
Admission: RE | Admit: 2013-07-30 | Discharge: 2013-07-30 | Disposition: A | Discharge status: Home / Self Care | Payer: Medicaid Other | Source: Ambulatory Visit | Attending: Internal Medicine | Admitting: Internal Medicine

## 2013-07-30 ENCOUNTER — Other Ambulatory Visit: Payer: Self-pay | Admitting: *Deleted

## 2013-07-30 ENCOUNTER — Ambulatory Visit (INDEPENDENT_AMBULATORY_CARE_PROVIDER_SITE_OTHER): Payer: Medicaid Other | Admitting: Internal Medicine

## 2013-07-30 VITALS — BP 110/70 | HR 76 | Temp 98.0°F | Ht 62.0 in | Wt 136.5 lb

## 2013-07-30 DIAGNOSIS — J441 Chronic obstructive pulmonary disease with (acute) exacerbation: Secondary | ICD-10-CM

## 2013-07-30 DIAGNOSIS — Z7189 Other specified counseling: Secondary | ICD-10-CM

## 2013-07-30 DIAGNOSIS — Z716 Tobacco abuse counseling: Secondary | ICD-10-CM

## 2013-07-30 DIAGNOSIS — F17200 Nicotine dependence, unspecified, uncomplicated: Secondary | ICD-10-CM

## 2013-07-30 MED ORDER — METHYLPREDNISOLONE ACETATE 40 MG/ML IJ SUSP: 40.0000 mg | Freq: Once | INTRAMUSCULAR | Status: DC

## 2013-07-30 MED ORDER — PREDNISONE (PAK) 10 MG PO TABS: ORAL_TABLET | Freq: Every day | ORAL | Status: DC

## 2013-07-30 MED ORDER — METHYLPREDNISOLONE ACETATE 40 MG/ML IJ SUSP
40.0000 mg | Freq: Once | INTRAMUSCULAR | Status: AC
  Administered 2013-07-30: 40 mg via INTRAMUSCULAR

## 2013-07-30 MED ORDER — MOMETASONE FURO-FORMOTEROL FUM 100-5 MCG/ACT IN AERO: 2.0000 | INHALATION_SPRAY | Freq: Two times a day (BID) | RESPIRATORY_TRACT | Status: DC

## 2013-07-30 MED ORDER — ALBUTEROL SULFATE HFA 108 (90 BASE) MCG/ACT IN AERS: 2.0000 | INHALATION_SPRAY | Freq: Four times a day (QID) | RESPIRATORY_TRACT | Status: DC | PRN

## 2013-07-30 MED ORDER — SPACER/AERO-HOLDING CHAMBERS KIT: PACK | Status: DC

## 2013-07-30 MED ORDER — HYDROCOD POLST-CHLORPHEN POLST 10-8 MG/5ML PO LQCR: 5.0000 mL | Freq: Two times a day (BID) | ORAL | Status: DC | PRN

## 2013-07-30 NOTE — Progress Notes (Signed)
Pre-visit discussion using our clinic review tool. No additional management support is needed unless otherwise documented below in the visit note.  

## 2013-07-30 NOTE — Patient Instructions (Addendum)
You are having a COPD exacerbation   will try to treat you at home with the following: without antibiotics unless your chest x ray shows an infilate suggesting pneumonia   Prednisone:  60 mg daily for 1  days,   then taper by 1 tablet daily until gone I   Dulera  2 puffs twice daily  Until the samples are gone  Use your albuterol inhaler as needed (up to every 6 hours)   Return on Friday to make sure you are improving   If your chest x ray shows a pneumonia,  I will add another round of antibiotic Please take a probiotic ( Align, Floraque or Culturelle) while you are on the antibiotic to prevent a serious antibiotic associated diarrhea  Called clostirudium dificile colitis (and a vaginal yeast infection)

## 2013-07-30 NOTE — Telephone Encounter (Signed)
Prednisone 10mg  6 day taper sent to pharmacy

## 2013-07-30 NOTE — Progress Notes (Signed)
Patient ID: Marcia [name], female   DOB: 10-02-1970, 42 y.o.   MRN: [id number]  Patient Active Problem List   Diagnosis Date Noted  . COPD exacerbation 07/30/2013  . Insomnia secondary to anxiety 07/09/2013  . History of hyperthyroidism 07/01/2013  . Mass of breast, left 07/01/2013  . Preoperative clearance 05/04/2013  . Snoring disorder 12/26/2012  . Right radial fracture 06/18/2012  . Diabetes mellitus type 1, uncontrolled, insulin dependent 05/22/2011  . Tobacco abuse 05/22/2011  . Tobacco abuse counseling 05/22/2011  . ABNORMAL EKG 09/10/2009    Subjective:  CC:   Chief Complaint  Patient presents with  . Bronchitis    persistent bronchitis x 2 wks-last treated last week (no CXR)    HPI:   Marcia Maldonadois a 42 y.o. female who presents with persistent cough.  She was treated with abx and steroids 2 weeks ago.  Contineus to cough despite finishing antibiotics and prednisone and using the tessalon perles.  No fevers, purulent sputum or myalgias.  Still smoking    Past Medical History  Diagnosis Date  . Vulvar lesion   . Myocardial infarction 1997    At age 85  . Diabetes mellitus   . History of HPV infection   . Heart murmur   . Hay fever     Allergies  . Vaginal cancer   . Thyroid disease   . Hypercholesterolemia   . Hypothyroidism   . Tobacco abuse     Past Surgical History  Procedure Laterality Date  . Ankle surgery  2010  . Tubal ligation  2004  . Tubal ligation  2004, [facility]  . Abdominal hysterectomy  10/12/12       The following portions of the patient's history were reviewed and updated as appropriate: Allergies, current medications, and problem list.    Review of Systems:   12 Pt  review of systems was negative except those addressed in the HPI,     History   Social History  . Marital Status: Divorced    Spouse Name: N/A    Number of Children: N/A  . Years of Education: N/A   Occupational History  . Not on file.   Social  History Main Topics  . Smoking status: Current Every Day Smoker -- 1.00 packs/day for 22 years    Types: Cigarettes  . Smokeless tobacco: Never Used  . Alcohol Use: No  . Drug Use: No  . Sexual Activity: Yes   Other Topics Concern  . Not on file   Social History Narrative   No regular exercise.   Lives with spouse.    Objective:  Filed Vitals:   07/30/13 0816  BP: 110/70  Pulse: 76  Temp: 98 F (36.7 C)     General appearance: alert, cooperative and appears stated age Ears: normal TM's and external ear canals both ears Throat: lips, mucosa, and tongue normal; teeth and gums normal Neck: no adenopathy, no carotid bruit, supple, symmetrical, trachea midline and thyroid not enlarged, symmetric, no tenderness/mass/nodules Back: symmetric, no curvature. ROM normal. No CVA tenderness. Lungs: mild expiratory wheezing bilaterally, no egophony Heart: regular rate and rhythm, S1, S2 normal, no murmur, click, rub or gallop Abdomen: soft, non-tender; bowel sounds normal; no masses,  no organomegaly Pulses: 2+ and symmetric Skin: Skin color, texture, turgor normal. No rashes or lesions Lymph nodes: Cervical, supraclavicular, and axillary nodes normal.  Assessment and Plan:  COPD exacerbation With recent treatment with antibiotics and steroids, without resolution.  She has mild wheezing on  exam. Inhaled  And oral steroids given.  Chest x ray normal.  No antibiotics since she denies fevers and purulent sputum.   Tobacco abuse counseling Urged to take the opportunity to quit smoking .  Smoking cessation instruction/counseling given:  counseled patient on the dangers of tobacco use, advised patient to stop smoking, and reviewed strategies to maximize success   Updated Medication List Outpatient Encounter Prescriptions as of 07/30/2013  Medication Sig  . ALPRAZolam (XANAX) 0.5 MG tablet Take 1 tablet (0.5 mg total) by mouth at bedtime as needed for sleep.  Marland Kitchen aspirin EC 81 MG tablet  Take 1 tablet (81 mg total) by mouth daily.  . benzonatate (TESSALON) 200 MG capsule Take 1 capsule (200 mg total) by mouth 3 (three) times daily as needed for cough.  . Cholecalciferol (VITAMIN D3) 50000 UNITS CAPS Take 50,000 capsules by mouth once a week.  . citalopram (CELEXA) 10 MG tablet TAKE 1 TABLET BY MOUTH EVERY DAY  . diazepam (VALIUM) 2 MG tablet Take 1 tablet (2 mg total) by mouth every 12 (twelve) hours as needed for anxiety.  Marland Kitchen estradiol (VIVELLE-DOT) 0.05 MG/24HR patch Place 1 patch onto the skin once a week.  . Insulin Infusion Pump Supplies (PARADIGM QUICK-SET 18" ) MISC 1 Device by Does not apply route every 3 (three) days.  . Insulin Infusion Pump Supplies (PARADIGM RESERVOIR ) MISC 1 Device by Does not apply route every 3 (three) days.  . insulin lispro (HUMALOG) 100 UNIT/ML injection Used as ordered for Insulin pump, total of 60 units per day.  . levothyroxine (SYNTHROID, LEVOTHROID) 112 MCG tablet Take 1 tablet (112 mcg total) by mouth daily.  Marland Kitchen lisinopril (PRINIVIL,ZESTRIL) 2.5 MG tablet Take 1 tablet (2.5 mg total) by mouth daily.  . metoprolol tartrate (LOPRESSOR) 25 MG tablet Take 1 tablet (25 mg total) by mouth 2 (two) times daily.  . NON FORMULARY Humalog Insulin Pump.   Marland Kitchen omeprazole (PRILOSEC) 20 MG capsule Take 1 capsule (20 mg total) by mouth daily.  . simvastatin (ZOCOR) 40 MG tablet Take 40 mg by mouth every evening.  Marland Kitchen tiZANidine (ZANAFLEX) 4 MG tablet TAKE ONE TABLET BY MOUTH 3 TIMES A DAY AS NEEDED FOR MUSCLE SPASMS  . albuterol (PROVENTIL HFA;VENTOLIN HFA) 108 (90 BASE) MCG/ACT inhaler Inhale 2 puffs into the lungs every 6 (six) hours as needed for wheezing or shortness of breath.  . chlorpheniramine-HYDROcodone (TUSSIONEX) 10-8 MG/5ML LQCR Take 5 mLs by mouth every 12 (twelve) hours as needed for cough.  . mometasone-formoterol (DULERA) 100-5 MCG/ACT AERO Inhale 2 puffs into the lungs 2 (two) times daily.  Marland Kitchen Spacer/Aero-Holding Deretha Emory KIT For use with  [facility] and pro air [facility] Pharm[facility] please substitute the appropriate spacer needed  . [DISCONTINUED] azithromycin (ZITHROMAX) 500 MG tablet Take 1 tablet (500 mg total) by mouth daily.  . [DISCONTINUED] citalopram (CELEXA) 10 MG tablet TAKE 1 TABLET BY MOUTH EVERY DAY  . [DISCONTINUED] methylPREDNISolone acetate (DEPO-MEDROL) 40 MG/ML injection Inject 1 mL (40 mg total) into the muscle once.  . [EXPIRED] methylPREDNISolone acetate (DEPO-MEDROL) injection 40 mg

## 2013-08-03 ENCOUNTER — Encounter: Payer: Self-pay | Admitting: Internal Medicine

## 2013-08-03 NOTE — Assessment & Plan Note (Signed)
With mild wheezing on exam. Inhaled  And oral steroids given.  Chest x ray normal.  No antibiotics since she denies fevers and purulent sputum.

## 2013-08-03 NOTE — Assessment & Plan Note (Signed)
Urged to take the opportunity to quit smoking .  Smoking cessation instruction/counseling given:  counseled patient on the dangers of tobacco use, advised patient to stop smoking, and reviewed strategies to maximize success

## 2013-08-11 ENCOUNTER — Ambulatory Visit: Payer: Medicaid Other | Admitting: Internal Medicine

## 2013-08-12 ENCOUNTER — Ambulatory Visit (INDEPENDENT_AMBULATORY_CARE_PROVIDER_SITE_OTHER): Payer: Medicaid Other | Admitting: Adult Health

## 2013-08-17 ENCOUNTER — Other Ambulatory Visit: Payer: Self-pay | Admitting: Internal Medicine

## 2013-08-18 ENCOUNTER — Ambulatory Visit: Payer: Medicaid Other | Admitting: Endocrinology

## 2013-08-28 ENCOUNTER — Telehealth: Payer: Self-pay | Admitting: Internal Medicine

## 2013-08-28 NOTE — Telephone Encounter (Signed)
Relevant patient education assigned to patient using Emmi. ° °

## 2013-09-15 ENCOUNTER — Other Ambulatory Visit: Payer: Self-pay | Admitting: Internal Medicine

## 2013-09-15 MED ORDER — MOMETASONE FURO-FORMOTEROL FUM 100-5 MCG/ACT IN AERO: 2.0000 | INHALATION_SPRAY | Freq: Two times a day (BID) | RESPIRATORY_TRACT | Status: DC

## 2013-09-17 ENCOUNTER — Encounter: Payer: Self-pay | Admitting: Internal Medicine

## 2013-09-17 ENCOUNTER — Ambulatory Visit (INDEPENDENT_AMBULATORY_CARE_PROVIDER_SITE_OTHER)
Admission: RE | Admit: 2013-09-17 | Discharge: 2013-09-17 | Disposition: A | Discharge status: Home / Self Care | Payer: Medicaid Other | Source: Ambulatory Visit | Attending: Internal Medicine | Admitting: Internal Medicine

## 2013-09-17 ENCOUNTER — Other Ambulatory Visit: Payer: Self-pay | Admitting: Internal Medicine

## 2013-09-17 ENCOUNTER — Ambulatory Visit (INDEPENDENT_AMBULATORY_CARE_PROVIDER_SITE_OTHER): Payer: Medicaid Other | Admitting: Internal Medicine

## 2013-09-17 VITALS — BP 108/70 | HR 76 | Temp 98.7°F | Resp 16 | Wt 135.5 lb

## 2013-09-17 DIAGNOSIS — Z79899 Other long term (current) drug therapy: Secondary | ICD-10-CM

## 2013-09-17 DIAGNOSIS — I739 Peripheral vascular disease, unspecified: Secondary | ICD-10-CM

## 2013-09-17 DIAGNOSIS — F17200 Nicotine dependence, unspecified, uncomplicated: Secondary | ICD-10-CM

## 2013-09-17 DIAGNOSIS — M79642 Pain in left hand: Secondary | ICD-10-CM

## 2013-09-17 DIAGNOSIS — M25569 Pain in unspecified knee: Secondary | ICD-10-CM

## 2013-09-17 DIAGNOSIS — Z7189 Other specified counseling: Secondary | ICD-10-CM

## 2013-09-17 DIAGNOSIS — M79609 Pain in unspecified limb: Secondary | ICD-10-CM

## 2013-09-17 DIAGNOSIS — S63659A Sprain of metacarpophalangeal joint of unspecified finger, initial encounter: Secondary | ICD-10-CM

## 2013-09-17 DIAGNOSIS — M25562 Pain in left knee: Secondary | ICD-10-CM

## 2013-09-17 DIAGNOSIS — Z716 Tobacco abuse counseling: Secondary | ICD-10-CM

## 2013-09-17 NOTE — Progress Notes (Signed)
Pre-visit discussion using our clinic review tool. No additional management support is needed unless otherwise documented below in the visit note.  

## 2013-09-18 ENCOUNTER — Other Ambulatory Visit: Payer: Self-pay | Admitting: *Deleted

## 2013-09-18 DIAGNOSIS — M25569 Pain in unspecified knee: Secondary | ICD-10-CM | POA: Insufficient documentation

## 2013-09-18 DIAGNOSIS — S63659A Sprain of metacarpophalangeal joint of unspecified finger, initial encounter: Secondary | ICD-10-CM | POA: Insufficient documentation

## 2013-09-18 NOTE — Telephone Encounter (Signed)
Dr Loanne Drilling is managing her diabetes  So he refills it

## 2013-09-18 NOTE — Assessment & Plan Note (Signed)
Smoking cessation instruction/counseling given:  counseled patient on the dangers of tobacco use, advised patient to stop smoking, and reviewed strategies to maximize success.  She is motivated to quit but has had prior adverse effects from both Chantix and Wellbutrin. Suggested reducing quantity of cigaretttes each week by 1 daily

## 2013-09-18 NOTE — Progress Notes (Signed)
Patient ID: Marcia [name], female   DOB: [birth year], 43 y.o.   MRN: [id number]  Patient Active Problem List   Diagnosis Date Noted  . Medial knee pain 09/18/2013  . Sprain of metacarpophalangeal joint of left hand 09/18/2013  . COPD exacerbation 07/30/2013  . Insomnia secondary to anxiety 07/09/2013  . History of hyperthyroidism 07/01/2013  . Mass of breast, left 07/01/2013  . Preoperative clearance 05/04/2013  . Snoring disorder 12/26/2012  . Right radial fracture 06/18/2012  . Diabetes mellitus type 1, uncontrolled, insulin dependent 05/22/2011  . Tobacco abuse 05/22/2011  . Tobacco abuse counseling 05/22/2011  . ABNORMAL EKG 09/10/2009    Subjective:  CC:   Chief Complaint  Patient presents with  . Acute Visit    fell on ice X 3 Weeks Pain in hip, wrist ,hand and hip    HPI:   Marcia [name] is a 43 y.o. female who presents for  Persistent pain over left thenar eminence after falling several weeks ago on an icy patch.  Patient states that she slipped on some ice stairs and fell onto her left side. Her left hand, left knee and left hip have been painful to move for the last several weeks. She did not have an ER evaluation. She noted bruising only on her left thigh. Her most painful area is the dorsum of the left hand between the thumb and index finger. She has pain with opposition of these fingers and with gripping things. She initially iced the knee and the hand and took several doses of an over-the-counter nonsteroidal anti-inflammatory.  There has been no significant change.   Past Medical History  Diagnosis Date  . Vulvar lesion   . Myocardial infarction 1997    At age 71  . Diabetes mellitus   . History of HPV infection   . Heart murmur   . Hay fever     Allergies  . Vaginal cancer   . Thyroid disease   . Hypercholesterolemia   . Hypothyroidism   . Tobacco abuse     Past Surgical History  Procedure Laterality Date  . Ankle surgery  2010  . Tubal  ligation  2004  . Tubal ligation  2004, [facility]  . Abdominal hysterectomy  10/12/12       The following portions of the patient's history were reviewed and updated as appropriate: Allergies, current medications, and problem list.    Review of Systems:   Patient denies headache, fevers, malaise, unintentional weight loss, skin rash, eye pain, sinus congestion and sinus pain, sore throat, dysphagia,  hemoptysis , cough, dyspnea, wheezing, chest pain, palpitations, orthopnea, edema, abdominal pain, nausea, melena, diarrhea, constipation, flank pain, dysuria, hematuria, urinary  Frequency, nocturia, numbness, tingling, seizures,  Focal weakness, Loss of consciousness,  Tremor, insomnia, depression, anxiety, and suicidal ideation.     History   Social History  . Marital Status: Divorced    Spouse Name: N/A    Number of Children: N/A  . Years of Education: N/A   Occupational History  . Not on file.   Social History Main Topics  . Smoking status: Current Every Day Smoker -- 1.00 packs/day for 22 years    Types: Cigarettes  . Smokeless tobacco: Never Used  . Alcohol Use: No  . Drug Use: No  . Sexual Activity: Yes   Other Topics Concern  . Not on file   Social History Narrative   No regular exercise.   Lives with spouse.    Objective:  Filed Vitals:  09/17/13 0850  BP: 108/70  Pulse: 76  Temp: 98.7 F (37.1 C)  Resp: 16     General appearance: alert, cooperative and appears stated age Ears: normal TM's and external ear canals both ears Throat: lips, mucosa, and tongue normal; teeth and gums normal Neck: no adenopathy, no carotid bruit, supple, symmetrical, trachea midline and thyroid not enlarged, symmetric, no tenderness/mass/nodules Back: symmetric, no curvature. ROM normal. No CVA tenderness. Lungs: clear to auscultation bilaterally Heart: regular rate and rhythm, S1, S2 normal, no murmur, click, rub or gallop Abdomen: soft, non-tender; bowel sounds normal; no  masses,  no organomegaly Pulses: 2+ and symmetric Skin: Skin color, texture, turgor normal. No rashes or lesions Lymph nodes: Cervical, supraclavicular, and axillary nodes normal.  Assessment and Plan: Medial knee pain Acute, occurring after blunt force trauma to the lateral knee several weeks ago from a fall. She has no effusion. There is no clicking or crepitus in the knee. Plain films were normal. Suggest sprain treatment with the Voltaren and physical therapy. May need MRI if pain continues to rule out ligament tear.  Sprain of metacarpophalangeal joint of left hand Plain films were done to rule out scaphoid bone fracture. No fractures were seen. Suggest treatment sprain with nonsteroidal anti-inflammatories and activity modification. Physical therapy referral offered. Patient will followup with orthopedics if no improvement.  Tobacco abuse counseling Smoking cessation instruction/counseling given:  counseled patient on the dangers of tobacco use, advised patient to stop smoking, and reviewed strategies to maximize success.  She is motivated to quit but has had prior adverse effects from both Chantix and Wellbutrin. Suggested reducing quantity of cigaretttes each week by 1 daily      Updated Medication List Outpatient Encounter Prescriptions as of 09/17/2013  Medication Sig  . albuterol (PROVENTIL HFA;VENTOLIN HFA) 108 (90 BASE) MCG/ACT inhaler Inhale 2 puffs into the lungs every 6 (six) hours as needed for wheezing or shortness of breath.  . ALPRAZolam (XANAX) 0.5 MG tablet Take 1 tablet (0.5 mg total) by mouth at bedtime as needed for sleep.  Marland Kitchen aspirin 81 MG EC tablet TAKE 1 TABLET (81 MG TOTAL) BY MOUTH DAILY.  Marland Kitchen Cholecalciferol (VITAMIN D3) 50000 UNITS CAPS Take 50,000 capsules by mouth once a week.  . citalopram (CELEXA) 10 MG tablet TAKE 1 TABLET BY MOUTH EVERY DAY  . estradiol (VIVELLE-DOT) 0.05 MG/24HR patch Place 1 patch onto the skin once a week.  . Insulin Infusion Pump  Supplies (PARADIGM QUICK-SET 18" 6MM) MISC 1 Device by Does not apply route every 3 (three) days.  . Insulin Infusion Pump Supplies (PARADIGM RESERVOIR 3ML) MISC 1 Device by Does not apply route every 3 (three) days.  . insulin lispro (HUMALOG) 100 UNIT/ML injection Used as ordered for Insulin pump, total of 60 units per day.  . levothyroxine (SYNTHROID, LEVOTHROID) 112 MCG tablet Take 1 tablet (112 mcg total) by mouth daily.  Marland Kitchen lisinopril (PRINIVIL,ZESTRIL) 2.5 MG tablet Take 1 tablet (2.5 mg total) by mouth daily.  . metoprolol tartrate (LOPRESSOR) 25 MG tablet Take 1 tablet (25 mg total) by mouth 2 (two) times daily.  . mometasone-formoterol (DULERA) 100-5 MCG/ACT AERO Inhale 2 puffs into the lungs 2 (two) times daily.  . NON FORMULARY Humalog Insulin Pump.   Marland Kitchen omeprazole (PRILOSEC) 20 MG capsule Take 1 capsule (20 mg total) by mouth daily.  . simvastatin (ZOCOR) 40 MG tablet Take 40 mg by mouth every evening.  Marland Kitchen Spacer/Aero-Holding Josiah Lobo KIT For use with Ruthe Mannan and pro air [facility]  please substitute the appropriate spacer needed  . benzonatate (TESSALON) 200 MG capsule Take 1 capsule (200 mg total) by mouth 3 (three) times daily as needed for cough.  . chlorpheniramine-HYDROcodone (TUSSIONEX) 10-8 MG/5ML LQCR Take 5 mLs by mouth every 12 (twelve) hours as needed for cough.  . diazepam (VALIUM) 2 MG tablet Take 1 tablet (2 mg total) by mouth every 12 (twelve) hours as needed for anxiety.  . predniSONE (STERAPRED UNI-PAK) 10 MG tablet Take by mouth daily. 6 day taper-as directed  . tiZANidine (ZANAFLEX) 4 MG tablet TAKE ONE TABLET BY MOUTH 3 TIMES A DAY AS NEEDED FOR MUSCLE SPASMS

## 2013-09-18 NOTE — Telephone Encounter (Signed)
Do you refill this or Dr. Loanne Drilling?

## 2013-09-18 NOTE — Telephone Encounter (Signed)
Refill Request  Humalog 100 units  Inject 60 units per day (used as ordered for insulin pump)

## 2013-09-18 NOTE — Assessment & Plan Note (Signed)
Plain films were done to rule out scaphoid bone fracture. No fractures were seen. Suggest treatment sprain with nonsteroidal anti-inflammatories and activity modification. Physical therapy referral offered. Patient will followup with orthopedics if no improvement.

## 2013-09-18 NOTE — Assessment & Plan Note (Signed)
Acute, occurring after blunt force trauma to the lateral knee several weeks ago from a fall. She has no effusion. There is no clicking or crepitus in the knee. Plain films were normal. Suggest sprain treatment with the Voltaren and physical therapy. May need MRI if pain continues to rule out ligament tear.

## 2013-09-19 ENCOUNTER — Other Ambulatory Visit: Payer: Self-pay | Admitting: Endocrinology

## 2013-09-19 ENCOUNTER — Other Ambulatory Visit: Payer: Self-pay | Admitting: *Deleted

## 2013-09-19 MED ORDER — INSULIN LISPRO 100 UNIT/ML ~~LOC~~ SOLN: SUBCUTANEOUS | Status: DC

## 2013-09-29 ENCOUNTER — Other Ambulatory Visit: Payer: Self-pay | Admitting: Internal Medicine

## 2013-09-29 ENCOUNTER — Other Ambulatory Visit: Payer: Self-pay | Admitting: *Deleted

## 2013-09-29 MED ORDER — ALPRAZOLAM 0.5 MG PO TABS: 0.5000 mg | ORAL_TABLET | Freq: Every evening | ORAL | Status: DC | PRN

## 2013-09-29 MED ORDER — INSULIN LISPRO 100 UNIT/ML ~~LOC~~ SOLN: SUBCUTANEOUS | Status: DC

## 2013-10-08 LAB — HM DIABETES EYE EXAM

## 2013-10-13 ENCOUNTER — Encounter: Payer: Self-pay | Admitting: Internal Medicine

## 2013-10-13 DIAGNOSIS — M25532 Pain in left wrist: Secondary | ICD-10-CM

## 2013-10-22 ENCOUNTER — Other Ambulatory Visit: Payer: Self-pay | Admitting: *Deleted

## 2013-10-22 ENCOUNTER — Other Ambulatory Visit: Payer: Self-pay | Admitting: Internal Medicine

## 2013-11-07 DIAGNOSIS — M25532 Pain in left wrist: Secondary | ICD-10-CM | POA: Insufficient documentation

## 2013-11-11 ENCOUNTER — Ambulatory Visit (INDEPENDENT_AMBULATORY_CARE_PROVIDER_SITE_OTHER): Payer: Medicaid Other | Admitting: Internal Medicine

## 2013-11-11 ENCOUNTER — Encounter: Payer: Self-pay | Admitting: Internal Medicine

## 2013-11-11 VITALS — BP 114/72 | HR 58 | Temp 98.5°F | Resp 16 | Wt 143.5 lb

## 2013-11-11 DIAGNOSIS — K5900 Constipation, unspecified: Secondary | ICD-10-CM | POA: Insufficient documentation

## 2013-11-11 DIAGNOSIS — Z716 Tobacco abuse counseling: Secondary | ICD-10-CM

## 2013-11-11 DIAGNOSIS — E875 Hyperkalemia: Secondary | ICD-10-CM

## 2013-11-11 DIAGNOSIS — J441 Chronic obstructive pulmonary disease with (acute) exacerbation: Secondary | ICD-10-CM

## 2013-11-11 DIAGNOSIS — I739 Peripheral vascular disease, unspecified: Secondary | ICD-10-CM

## 2013-11-11 DIAGNOSIS — Z7189 Other specified counseling: Secondary | ICD-10-CM

## 2013-11-11 DIAGNOSIS — F17200 Nicotine dependence, unspecified, uncomplicated: Secondary | ICD-10-CM

## 2013-11-11 DIAGNOSIS — Z79899 Other long term (current) drug therapy: Secondary | ICD-10-CM

## 2013-11-11 LAB — LIPID PANEL
CHOL/HDL RATIO: 4
CHOLESTEROL: 206 mg/dL — AB (ref 0–200)
HDL: 55.8 mg/dL (ref >39.00)
LDL Cholesterol: 128 mg/dL — ABNORMAL HIGH (ref 0–99)
TRIGLYCERIDES: 112 mg/dL (ref 0.0–149.0)
VLDL: 22.4 mg/dL (ref 0.0–40.0)

## 2013-11-11 LAB — COMPREHENSIVE METABOLIC PANEL
ALT: 18 U/L (ref 0–35)
AST: 17 U/L (ref 0–37)
Albumin: 3.7 g/dL (ref 3.5–5.2)
Alkaline Phosphatase: 60 U/L (ref 39–117)
BUN: 16 mg/dL (ref 6–23)
CHLORIDE: 98 meq/L (ref 96–112)
CO2: 24 mEq/L (ref 19–32)
Calcium: 9 mg/dL (ref 8.4–10.5)
Creatinine, Ser: 0.7 mg/dL (ref 0.4–1.2)
GFR: 95.54 mL/min (ref >60.00)
GLUCOSE: 370 mg/dL — AB (ref 70–99)
Potassium: 5.5 mEq/L — ABNORMAL HIGH (ref 3.5–5.1)
Sodium: 130 mEq/L — ABNORMAL LOW (ref 135–145)
TOTAL PROTEIN: 6.5 g/dL (ref 6.0–8.3)
Total Bilirubin: 0.7 mg/dL (ref 0.3–1.2)

## 2013-11-11 MED ORDER — TIOTROPIUM BROMIDE MONOHYDRATE 2.5 MCG/ACT IN AERS: 2.0000 | INHALATION_SPRAY | Freq: Every day | RESPIRATORY_TRACT | Status: DC

## 2013-11-11 MED ORDER — LACTULOSE 20 GM/30ML PO SOLN: 30.0000 mL | Freq: Four times a day (QID) | ORAL | Status: DC | PRN

## 2013-11-11 MED ORDER — FLUTICASONE-SALMETEROL 250-50 MCG/DOSE IN AEPB: 1.0000 | INHALATION_SPRAY | Freq: Two times a day (BID) | RESPIRATORY_TRACT | Status: DC

## 2013-11-11 MED ORDER — HYDROCOD POLST-CHLORPHEN POLST 10-8 MG/5ML PO LQCR: 5.0000 mL | Freq: Two times a day (BID) | ORAL | Status: DC | PRN

## 2013-11-11 MED ORDER — PREDNISONE (PAK) 10 MG PO TABS: ORAL_TABLET | ORAL | Status: DC

## 2013-11-11 NOTE — Progress Notes (Signed)
Patient ID: Marcia [name], female   DOB: February 24, 1971, 43 y.o.   MRN: [id number]  Patient Active Problem List   Diagnosis Date Noted  . Unspecified constipation 11/11/2013  . Medial knee pain 09/18/2013  . Sprain of metacarpophalangeal joint of left hand 09/18/2013  . COPD exacerbation 07/30/2013  . Insomnia secondary to anxiety 07/09/2013  . History of hyperthyroidism 07/01/2013  . Mass of breast, left 07/01/2013  . Preoperative clearance 05/04/2013  . Snoring disorder 12/26/2012  . Right radial fracture 06/18/2012  . Diabetes mellitus type 1, uncontrolled, insulin dependent 05/22/2011  . Tobacco abuse 05/22/2011  . Tobacco abuse counseling 05/22/2011  . ABNORMAL EKG 09/10/2009    Subjective:  CC:   Chief Complaint  Patient presents with  . Acute Visit    feeling bloated stomach cramps .  Marland Kitchen Wheezing    Patient reported  . Shortness of Breath    Patient reported    HPI:   Marcia [name] is a 43 y.o. female who presents for Diffuse abdominal pain accompanied by constipation.    Last BM was two days ago.  Stools have been small and brown.  Activity level has changed lately.  She has been working out at the gym 4 days per week for the last 4 weeks.  She maintains hydration by drinking 3 24 ounce bottles of water daily   2) COPD exacerbation.  She has been having recurrent wheezing which is worse early morning , improves during the day  And aggravated by exercising>  Her cough is aggravated by lying down. Has reduced her tobacco use to < 1/2 pack daily because of her current symptoms. Persistent pain over left thenar eminence after falling several weeks ago on an icy patch.  Patient states that she slipped on some ice stairs and fell onto her left side. Her left hand, left knee and left hip have been painful to move for the last several weeks. She did not have an ER evaluation. She noted bruising only on her left thigh. Her most painful area is the dorsum of the left hand  between the thumb and index finger. She has pain with opposition of these fingers and with gripping things. She initially iced the knee and the hand and took several doses of an over-the-counter nonsteroidal anti-inflammatory.  There has been no significant change.   Past Medical History  Diagnosis Date  . Vulvar lesion   . Myocardial infarction 1997    At age 89  . Diabetes mellitus   . History of HPV infection   . Heart murmur   . Hay fever     Allergies  . Vaginal cancer   . Thyroid disease   . Hypercholesterolemia   . Hypothyroidism   . Tobacco abuse     Past Surgical History  Procedure Laterality Date  . Ankle surgery  2010  . Tubal ligation  2004  . Tubal ligation  2004, [facility]  . Abdominal hysterectomy  10/12/12       The following portions of the patient's history were reviewed and updated as appropriate: Allergies, current medications, and problem list.    Review of Systems:   Patient denies headache, fevers, malaise, unintentional weight loss, skin rash, eye pain, sinus congestion and sinus pain, sore throat, dysphagia,  hemoptysis , cough, dyspnea, wheezing, chest pain, palpitations, orthopnea, edema, abdominal pain, nausea, melena, diarrhea, constipation, flank pain, dysuria, hematuria, urinary  Frequency, nocturia, numbness, tingling, seizures,  Focal weakness, Loss of consciousness,  Tremor, insomnia, depression, anxiety,  and suicidal ideation.     History   Social History  . Marital Status: Divorced    Spouse Name: N/A    Number of Children: N/A  . Years of Education: N/A   Occupational History  . Not on file.   Social History Main Topics  . Smoking status: Current Every Day Smoker -- 1.00 packs/day for 22 years    Types: Cigarettes  . Smokeless tobacco: Never Used  . Alcohol Use: No  . Drug Use: No  . Sexual Activity: Yes   Other Topics Concern  . Not on file   Social History Narrative   No regular exercise.   Lives with spouse.     Objective:  Filed Vitals:   11/11/13 0854  BP: 114/72  Pulse: 58  Temp: 98.5 F (36.9 C)  Resp: 16     General appearance: alert, cooperative and appears stated age Ears: normal TM's and external ear canals both ears Throat: lips, mucosa, and tongue normal; teeth and gums normal Neck: no adenopathy, no carotid bruit, supple, symmetrical, trachea midline and thyroid not enlarged, symmetric, no tenderness/mass/nodules Back: symmetric, no curvature. ROM normal. No CVA tenderness. Lungs: clear to auscultation bilaterally Heart: regular rate and rhythm, S1, S2 normal, no murmur, click, rub or gallop Abdomen: soft, non-tender; bowel sounds normal; no masses,  no organomegaly Pulses: 2+ and symmetric Skin: Skin color, texture, turgor normal. No rashes or lesions Lymph nodes: Cervical, supraclavicular, and axillary nodes normal.  Assessment and Plan:  COPD exacerbation Steroids, inhaled  Bronchodilators including spiriva and advair, and tussionex.  Tobacco abuse counseling Smoking cessation instruction/counseling given:  counseled patient on the dangers of tobacco use, advised patient to stop smoking, and reviewed strategies to maximize success  Unspecified constipation Recommended checking lytes,  Substituting G2 for water.  Lactulose once, followed by metamucil daily.    Updated Medication List Outpatient Encounter Prescriptions as of 11/11/2013  Medication Sig  . albuterol (PROVENTIL HFA;VENTOLIN HFA) 108 (90 BASE) MCG/ACT inhaler Inhale 2 puffs into the lungs every 6 (six) hours as needed for wheezing or shortness of breath.  . ALPRAZolam (XANAX) 0.5 MG tablet Take 1 tablet (0.5 mg total) by mouth at bedtime as needed for sleep.  Marland Kitchen aspirin 81 MG EC tablet TAKE 1 TABLET (81 MG TOTAL) BY MOUTH DAILY.  Marland Kitchen aspirin 81 MG EC tablet TAKE 1 TABLET (81 MG TOTAL) BY MOUTH DAILY.  Marland Kitchen aspirin 81 MG EC tablet TAKE 1 TABLET (81 MG TOTAL) BY MOUTH DAILY.  Marland Kitchen Cholecalciferol (VITAMIN D3)  50000 UNITS CAPS Take 50,000 capsules by mouth once a week.  . citalopram (CELEXA) 10 MG tablet TAKE 1 TABLET BY MOUTH EVERY DAY  . estradiol (VIVELLE-DOT) 0.05 MG/24HR patch Place 1 patch onto the skin once a week.  . Insulin Infusion Pump Supplies (PARADIGM QUICK-SET 18" 6MM) MISC 1 Device by Does not apply route every 3 (three) days.  . Insulin Infusion Pump Supplies (PARADIGM RESERVOIR 3ML) MISC 1 Device by Does not apply route every 3 (three) days.  . insulin lispro (HUMALOG) 100 UNIT/ML injection Used as ordered for Insulin pump, total of 60 units per day.  . levothyroxine (SYNTHROID, LEVOTHROID) 112 MCG tablet Take 1 tablet (112 mcg total) by mouth daily.  Marland Kitchen lisinopril (PRINIVIL,ZESTRIL) 2.5 MG tablet Take 1 tablet (2.5 mg total) by mouth daily.  . metoprolol tartrate (LOPRESSOR) 25 MG tablet Take 1 tablet (25 mg total) by mouth 2 (two) times daily.  . mometasone-formoterol (DULERA) 100-5 MCG/ACT AERO Inhale 2  puffs into the lungs 2 (two) times daily.  . NON FORMULARY Humalog Insulin Pump.   Marland Kitchen omeprazole (PRILOSEC) 20 MG capsule Take 1 capsule (20 mg total) by mouth daily.  . simvastatin (ZOCOR) 40 MG tablet Take 40 mg by mouth every evening.  Marland Kitchen Spacer/Aero-Holding Josiah Lobo KIT For use with [facility] and pro air [facility] please substitute the appropriate spacer needed  . chlorpheniramine-HYDROcodone (TUSSIONEX PENNKINETIC ER) 10-8 MG/5ML LQCR Take 5 mLs by mouth every 12 (twelve) hours as needed for cough.  . Fluticasone-Salmeterol (ADVAIR DISKUS) 250-50 MCG/DOSE AEPB Inhale 1 puff into the lungs 2 (two) times daily.  . Lactulose 20 GM/30ML SOLN Take 30 mLs (20 g total) by mouth every 6 (six) hours as needed. To relieve constipation  . predniSONE (STERAPRED UNI-PAK) 10 MG tablet 6 tablets on Day 1 , then reduce by 1 tablet daily until gone  . Tiotropium Bromide Monohydrate (SPIRIVA RESPIMAT) 2.5 MCG/ACT AERS Inhale 2 puffs into the lungs daily.     Orders Placed This Encounter   Procedures  . Basic metabolic panel    No Follow-up on file.

## 2013-11-11 NOTE — Assessment & Plan Note (Addendum)
Steroids, inhaled  Bronchodilators including spiriva and advair, and tussionex.

## 2013-11-11 NOTE — Assessment & Plan Note (Signed)
Smoking cessation instruction/counseling given:  counseled patient on the dangers of tobacco use, advised patient to stop smoking, and reviewed strategies to maximize success 

## 2013-11-11 NOTE — Assessment & Plan Note (Signed)
Recommended checking lytes,  Substituting G2 for water.  Lactulose once, followed by metamucil daily.

## 2013-11-11 NOTE — Progress Notes (Signed)
Pre-visit discussion using our clinic review tool. No additional management support is needed unless otherwise documented below in the visit note.  

## 2013-11-11 NOTE — Patient Instructions (Signed)
For the COPD Advair 1 puff twice daily utnil gone Spiriva 1 puff twce daily utnil gone Prednisone 6 day taper Start allegra daily tussiones every 12 hours as needed for cough    Constipation:  increaes intake of G2 to one bottle daily when working out,  [employer] use water Lactulose 30 ccs ,  Relief in 6 hours Try using miralax on a daily basis  Return in one week

## 2013-11-12 ENCOUNTER — Encounter: Payer: Self-pay | Admitting: Internal Medicine

## 2013-11-12 ENCOUNTER — Telehealth: Payer: Self-pay | Admitting: Internal Medicine

## 2013-11-12 NOTE — Telephone Encounter (Signed)
Relevant patient education assigned to patient using Emmi. ° °

## 2013-11-18 ENCOUNTER — Other Ambulatory Visit: Payer: Self-pay | Admitting: Endocrinology

## 2013-11-20 ENCOUNTER — Encounter: Payer: Self-pay | Admitting: Internal Medicine

## 2013-11-20 ENCOUNTER — Encounter: Payer: Self-pay | Admitting: Emergency Medicine

## 2013-11-20 ENCOUNTER — Other Ambulatory Visit: Payer: Self-pay | Admitting: Internal Medicine

## 2013-11-20 ENCOUNTER — Ambulatory Visit (INDEPENDENT_AMBULATORY_CARE_PROVIDER_SITE_OTHER): Payer: Medicaid Other | Admitting: Internal Medicine

## 2013-11-20 VITALS — BP 102/60 | HR 69 | Temp 98.1°F | Resp 18 | Wt 142.5 lb

## 2013-11-20 DIAGNOSIS — J449 Chronic obstructive pulmonary disease, unspecified: Secondary | ICD-10-CM

## 2013-11-20 DIAGNOSIS — J441 Chronic obstructive pulmonary disease with (acute) exacerbation: Secondary | ICD-10-CM

## 2013-11-20 DIAGNOSIS — F17200 Nicotine dependence, unspecified, uncomplicated: Secondary | ICD-10-CM

## 2013-11-20 DIAGNOSIS — Z7189 Other specified counseling: Secondary | ICD-10-CM

## 2013-11-20 DIAGNOSIS — Z716 Tobacco abuse counseling: Secondary | ICD-10-CM

## 2013-11-20 MED ORDER — TIOTROPIUM BROMIDE MONOHYDRATE 2.5 MCG/ACT IN AERS: 2.0000 | INHALATION_SPRAY | Freq: Every day | RESPIRATORY_TRACT | Status: DC

## 2013-11-20 MED ORDER — FLUTICASONE-SALMETEROL 250-50 MCG/DOSE IN AEPB: 1.0000 | INHALATION_SPRAY | Freq: Two times a day (BID) | RESPIRATORY_TRACT | Status: DC

## 2013-11-20 NOTE — Progress Notes (Signed)
Pre-visit discussion using our clinic review tool. No additional management support is needed unless otherwise documented below in the visit note.  

## 2013-11-20 NOTE — Patient Instructions (Signed)
Despite your symptoms,  Your lung exam today is clear and your oxygen levels are excellent  Your symptoms are from COPD.   Please continue Spiriva twice daily and Advair twice daily .  These are "maintenance inhalers" meaning they should be used continually , even if you have no symptoms.  The albuterol inhaler is your rescue inhaler.  This can be used every 6 hours for immediate relief of wheezing or shortness of breath  I am referring you for pulmonary function tests at [facility] and a subsequent evaluation by Dr Lake Bells,  [place] TO DETERIORATE DESPITE ALL OF OUR EFFORTS AND MEDICATIONS

## 2013-11-20 NOTE — Progress Notes (Signed)
Patient ID: Marcia [name], female   DOB: [birth year], 43 y.o.   MRN: [id number]  Patient Active Problem List   Diagnosis Date Noted  . Unspecified constipation 11/11/2013  . Medial knee pain 09/18/2013  . Sprain of metacarpophalangeal joint of left hand 09/18/2013  . COPD exacerbation 07/30/2013  . Insomnia secondary to anxiety 07/09/2013  . History of hyperthyroidism 07/01/2013  . Mass of breast, left 07/01/2013  . Preoperative clearance 05/04/2013  . Snoring disorder 12/26/2012  . Right radial fracture 06/18/2012  . Diabetes mellitus type 1, uncontrolled, insulin dependent 05/22/2011  . Tobacco abuse 05/22/2011  . Tobacco abuse counseling 05/22/2011  . ABNORMAL EKG 09/10/2009    Subjective:  CC:   Chief Complaint  Patient presents with  . Follow-up    1 week chest tightnes still having difficulty  . Shortness of Breath    HPI:   Marcia [name] is a 43 y.o. female who presents for Follow up on COPD exacerbation .  She reports persistent dyspnea  With exertion.  Has been using spiriva daily but not using advair correctly due to misunderstanding.  She is still smoking but has reduced her use of cigarettes.     Past Medical History  Diagnosis Date  . Vulvar lesion   . Myocardial infarction 1997    At age 55  . Diabetes mellitus   . History of HPV infection   . Heart murmur   . Hay fever     Allergies  . Vaginal cancer   . Thyroid disease   . Hypercholesterolemia   . Hypothyroidism   . Tobacco abuse     Past Surgical History  Procedure Laterality Date  . Ankle surgery  2010  . Tubal ligation  2004  . Tubal ligation  2004, [facility]  . Abdominal hysterectomy  10/12/12       The following portions of the patient's history were reviewed and updated as appropriate: Allergies, current medications, and problem list.    Review of Systems:   Patient denies headache, fevers, malaise, unintentional weight loss, skin rash, eye pain, sinus congestion and sinus  pain, sore throat, dysphagia,  hemoptysis , cough, dyspnea, wheezing, chest pain, palpitations, orthopnea, edema, abdominal pain, nausea, melena, diarrhea, constipation, flank pain, dysuria, hematuria, urinary  Frequency, nocturia, numbness, tingling, seizures,  Focal weakness, Loss of consciousness,  Tremor, insomnia, depression, anxiety, and suicidal ideation.     History   Social History  . Marital Status: Divorced    Spouse Name: N/A    Number of Children: N/A  . Years of Education: N/A   Occupational History  . Not on file.   Social History Main Topics  . Smoking status: Current Every Day Smoker -- 1.00 packs/day for 22 years    Types: Cigarettes  . Smokeless tobacco: Never Used  . Alcohol Use: No  . Drug Use: No  . Sexual Activity: Yes   Other Topics Concern  . Not on file   Social History Narrative   No regular exercise.   Lives with spouse.    Objective:  Filed Vitals:   11/20/13 0915  BP: 102/60  Pulse: 69  Temp: 98.1 F (36.7 C)  Resp: 18     General appearance: alert, cooperative and appears stated age Ears: normal TM's and external ear canals both ears Throat: lips, mucosa, and tongue normal; teeth and gums normal Neck: no adenopathy, no carotid bruit, supple, symmetrical, trachea midline and thyroid not enlarged, symmetric, no tenderness/mass/nodules Back: symmetric, no curvature. ROM normal.  No CVA tenderness. Lungs: clear to auscultation bilaterally Heart: regular rate and rhythm, S1, S2 normal, no murmur, click, rub or gallop Abdomen: soft, non-tender; bowel sounds normal; no masses,  no organomegaly Pulses: 2+ and symmetric Skin: Skin color, texture, turgor normal. No rashes or lesions Lymph nodes: Cervical, supraclavicular, and axillary nodes normal.  Assessment and Plan:  COPD exacerbation Lung exam is improved.  Continue Advair bid and spiriva once daily .  PFTS ordered.  tobacco cessation counseling given.   Tobacco abuse counseling The  patient was counseled on the dangers of tobacco use, and was advised to quit.  Reviewed strategies to maximize success, including removing cigarettes and smoking materials from environment.   Updated Medication List Outpatient Encounter Prescriptions as of 11/20/2013  Medication Sig  . albuterol (PROVENTIL HFA;VENTOLIN HFA) 108 (90 BASE) MCG/ACT inhaler Inhale 2 puffs into the lungs every 6 (six) hours as needed for wheezing or shortness of breath.  . ALPRAZolam (XANAX) 0.5 MG tablet Take 1 tablet (0.5 mg total) by mouth at bedtime as needed for sleep.  Marland Kitchen aspirin 81 MG EC tablet TAKE 1 TABLET (81 MG TOTAL) BY MOUTH DAILY.  . chlorpheniramine-HYDROcodone (TUSSIONEX PENNKINETIC ER) 10-8 MG/5ML LQCR Take 5 mLs by mouth every 12 (twelve) hours as needed for cough.  . Cholecalciferol (VITAMIN D3) 50000 UNITS CAPS Take 50,000 capsules by mouth once a week.  . citalopram (CELEXA) 10 MG tablet TAKE 1 TABLET BY MOUTH EVERY DAY  . estradiol (VIVELLE-DOT) 0.05 MG/24HR patch Place 1 patch onto the skin once a week.  . Fluticasone-Salmeterol (ADVAIR DISKUS) 250-50 MCG/DOSE AEPB Inhale 1 puff into the lungs 2 (two) times daily.  Marland Kitchen HUMALOG 100 UNIT/ML injection USE AS DIRECTED FOR INSULIN PUMP *TOTAL OF 60 UNITS PER DAY*  . Insulin Infusion Pump Supplies (PARADIGM QUICK-SET 18" 6MM) MISC 1 Device by Does not apply route every 3 (three) days.  . Insulin Infusion Pump Supplies (PARADIGM RESERVOIR 3ML) MISC 1 Device by Does not apply route every 3 (three) days.  . insulin lispro (HUMALOG) 100 UNIT/ML injection Used as ordered for Insulin pump, total of 60 units per day.  . Lactulose 20 GM/30ML SOLN Take 30 mLs (20 g total) by mouth every 6 (six) hours as needed. To relieve constipation  . levothyroxine (SYNTHROID, LEVOTHROID) 112 MCG tablet Take 1 tablet (112 mcg total) by mouth daily.  Marland Kitchen lisinopril (PRINIVIL,ZESTRIL) 2.5 MG tablet Take 1 tablet (2.5 mg total) by mouth daily.  . metoprolol tartrate (LOPRESSOR) 25  MG tablet Take 1 tablet (25 mg total) by mouth 2 (two) times daily.  . NON FORMULARY Humalog Insulin Pump.   Marland Kitchen omeprazole (PRILOSEC) 20 MG capsule Take 1 capsule (20 mg total) by mouth daily.  . simvastatin (ZOCOR) 40 MG tablet Take 40 mg by mouth every evening.  Marland Kitchen Spacer/Aero-Holding Josiah Lobo KIT For use with [facility] and [facility] air [facility] please substitute the appropriate spacer needed  . [DISCONTINUED] aspirin 81 MG EC tablet TAKE 1 TABLET (81 MG TOTAL) BY MOUTH DAILY.  . [DISCONTINUED] aspirin 81 MG EC tablet TAKE 1 TABLET (81 MG TOTAL) BY MOUTH DAILY.  . [DISCONTINUED] Fluticasone-Salmeterol (ADVAIR DISKUS) 250-50 MCG/DOSE AEPB Inhale 1 puff into the lungs 2 (two) times daily.  . [DISCONTINUED] mometasone-formoterol (DULERA) 100-5 MCG/ACT AERO Inhale 2 puffs into the lungs 2 (two) times daily.  . [DISCONTINUED] predniSONE (STERAPRED UNI-PAK) 10 MG tablet 6 tablets on Day 1 , then reduce by 1 tablet daily until gone  . [DISCONTINUED] Tiotropium Bromide Monohydrate (  SPIRIVA RESPIMAT) 2.5 MCG/ACT AERS Inhale 2 puffs into the lungs daily.  . [DISCONTINUED] Tiotropium Bromide Monohydrate (SPIRIVA RESPIMAT) 2.5 MCG/ACT AERS Inhale 2 puffs into the lungs daily.     Orders Placed This Encounter  Procedures  . Pulmonary function test    No Follow-up on file.

## 2013-11-22 ENCOUNTER — Encounter: Payer: Self-pay | Admitting: Internal Medicine

## 2013-11-22 NOTE — Assessment & Plan Note (Signed)
The patient was counseled on the dangers of tobacco use, and was advised to quit.  Reviewed strategies to maximize success, including removing cigarettes and smoking materials from environment. 

## 2013-11-22 NOTE — Assessment & Plan Note (Signed)
Lung exam is improved.  Continue Advair bid and spiriva once daily .  PFTS ordered.  tobacco cessation counseling given.

## 2013-11-24 ENCOUNTER — Other Ambulatory Visit: Payer: Self-pay | Admitting: *Deleted

## 2013-11-24 ENCOUNTER — Ambulatory Visit: Payer: Self-pay | Admitting: Internal Medicine

## 2013-11-24 LAB — PULMONARY FUNCTION TEST

## 2013-11-24 MED ORDER — FLUTICASONE-SALMETEROL 250-50 MCG/DOSE IN AEPB: 1.0000 | INHALATION_SPRAY | Freq: Two times a day (BID) | RESPIRATORY_TRACT | Status: DC

## 2013-11-24 MED ORDER — TIOTROPIUM BROMIDE MONOHYDRATE 2.5 MCG/ACT IN AERS: 2.0000 | INHALATION_SPRAY | Freq: Every day | RESPIRATORY_TRACT | Status: DC

## 2013-11-24 NOTE — Telephone Encounter (Signed)
Refillss ent for Spiriva and Advair, done as samples on 11/20/13 resent as refill as in chart.

## 2013-11-26 ENCOUNTER — Telehealth: Payer: Self-pay | Admitting: Internal Medicine

## 2013-11-26 DIAGNOSIS — Z0279 Encounter for issue of other medical certificate: Secondary | ICD-10-CM

## 2013-11-26 NOTE — Telephone Encounter (Signed)
Patient called stating pharmacy needs prior Authorization for Spirivia. Placed sample up front for patient to pick up until prior authorization. This patient is medicaid can you advise of Prior authorization ?

## 2013-11-26 NOTE — Telephone Encounter (Signed)
PA request form place in Dr.tullo box

## 2013-11-27 ENCOUNTER — Encounter: Payer: Self-pay | Admitting: Emergency Medicine

## 2013-11-27 ENCOUNTER — Telehealth: Payer: Self-pay | Admitting: Internal Medicine

## 2013-11-27 DIAGNOSIS — J449 Chronic obstructive pulmonary disease, unspecified: Secondary | ICD-10-CM

## 2013-11-27 DIAGNOSIS — J42 Unspecified chronic bronchitis: Secondary | ICD-10-CM | POA: Insufficient documentation

## 2013-11-27 NOTE — Telephone Encounter (Signed)
PFTS were abnormal,.  I am referring her to our pulmonologist Dr Lake Bells for evaluation

## 2013-11-27 NOTE — Telephone Encounter (Signed)
Patient notified as requested. 

## 2013-12-05 ENCOUNTER — Other Ambulatory Visit: Payer: Self-pay | Admitting: *Deleted

## 2013-12-05 ENCOUNTER — Other Ambulatory Visit: Payer: Self-pay | Admitting: Endocrinology

## 2013-12-05 MED ORDER — LISINOPRIL 2.5 MG PO TABS: 2.5000 mg | ORAL_TABLET | Freq: Every day | ORAL | Status: DC

## 2013-12-05 NOTE — Telephone Encounter (Signed)
Medication refilled

## 2013-12-05 NOTE — Telephone Encounter (Signed)
Ok to refill. Thanks 

## 2013-12-05 NOTE — Telephone Encounter (Signed)
Refill x 1 Ov is needed

## 2013-12-10 ENCOUNTER — Institutional Professional Consult (permissible substitution): Payer: Medicaid Other | Admitting: Pulmonary Disease

## 2013-12-15 ENCOUNTER — Institutional Professional Consult (permissible substitution): Payer: Medicaid Other | Admitting: Pulmonary Disease

## 2013-12-17 DIAGNOSIS — G5600 Carpal tunnel syndrome, unspecified upper limb: Secondary | ICD-10-CM | POA: Insufficient documentation

## 2013-12-19 ENCOUNTER — Encounter: Payer: Self-pay | Admitting: Internal Medicine

## 2013-12-22 ENCOUNTER — Telehealth: Payer: Self-pay | Admitting: *Deleted

## 2013-12-22 NOTE — Telephone Encounter (Signed)
Patient given Spiriva Respimat in [place] PA was faxed on 11/27/13 to medicaid patient has not received approval or dis approval re-faxed PA today . In meantime until approval is met patient would like to go back on regular Spiriva if appropriate,

## 2013-12-23 MED ORDER — TIOTROPIUM BROMIDE MONOHYDRATE 18 MCG IN CAPS: 18.0000 ug | ORAL_CAPSULE | Freq: Every day | RESPIRATORY_TRACT | Status: DC

## 2013-12-23 NOTE — Telephone Encounter (Signed)
rx for regular spiriva sent to [facility]

## 2013-12-23 NOTE — Telephone Encounter (Signed)
Patient notified alternative sent to pharmacy.

## 2013-12-23 NOTE — Addendum Note (Signed)
Addended by: Crecencio Mc on: 12/23/2013 07:48 AM   Modules accepted: Orders

## 2013-12-24 ENCOUNTER — Encounter: Payer: Self-pay | Admitting: Internal Medicine

## 2013-12-29 ENCOUNTER — Telehealth: Payer: Self-pay | Admitting: Internal Medicine

## 2013-12-29 NOTE — Telephone Encounter (Signed)
I keep receiving orders from the company that supplies her insulin pump.  They need to go to Dr. Gabriel Carina. At [facility] clinic she shi is managing her insulin .  The form is in the red folder.

## 2013-12-30 NOTE — Telephone Encounter (Signed)
Forms faxed to Dr. Joycie Peek office

## 2014-01-06 ENCOUNTER — Encounter: Payer: Self-pay | Admitting: Internal Medicine

## 2014-01-07 ENCOUNTER — Ambulatory Visit: Payer: Medicaid Other | Admitting: Endocrinology

## 2014-01-19 ENCOUNTER — Other Ambulatory Visit: Payer: Self-pay | Admitting: *Deleted

## 2014-01-19 MED ORDER — CITALOPRAM HYDROBROMIDE 10 MG PO TABS: ORAL_TABLET | ORAL | Status: DC

## 2014-02-05 ENCOUNTER — Ambulatory Visit (INDEPENDENT_AMBULATORY_CARE_PROVIDER_SITE_OTHER): Payer: Medicaid Other | Admitting: Pulmonary Disease

## 2014-02-05 ENCOUNTER — Encounter: Payer: Self-pay | Admitting: Pulmonary Disease

## 2014-02-05 VITALS — BP 124/68 | HR 86 | Ht 62.0 in | Wt 146.0 lb

## 2014-02-05 DIAGNOSIS — F17200 Nicotine dependence, unspecified, uncomplicated: Secondary | ICD-10-CM

## 2014-02-05 DIAGNOSIS — J410 Simple chronic bronchitis: Secondary | ICD-10-CM

## 2014-02-05 DIAGNOSIS — R059 Cough, unspecified: Secondary | ICD-10-CM

## 2014-02-05 DIAGNOSIS — Z720 Tobacco use: Secondary | ICD-10-CM

## 2014-02-05 DIAGNOSIS — R05 Cough: Secondary | ICD-10-CM | POA: Insufficient documentation

## 2014-02-05 DIAGNOSIS — R053 Chronic cough: Secondary | ICD-10-CM | POA: Insufficient documentation

## 2014-02-05 MED ORDER — LOSARTAN POTASSIUM 100 MG PO TABS: 100.0000 mg | ORAL_TABLET | Freq: Every day | ORAL | Status: DC

## 2014-02-05 NOTE — Assessment & Plan Note (Addendum)
Her primary problem is cough. I have reviewed her pulmonary function testing and they were completely normal and not consistent with COPD. See discussion below.  I explained to her that I feel like her cough is most likely due to either the lisinopril or due to an allergy to something in her bedroom. She says that she only gets the cough when she lays down to go to sleep and actually recently when she visited the [place] for several days she did not have a cough when she slept then. She does not have postnasal drip or acid reflux symptoms to suggest that that is the etiology.  Obviously her ongoing tobacco use contributes to her cough as well.  Plan: - Quit smoking quit smoking quit smoking -Stop lisinopril -Start losartan or another angiotensin receptor blocker if losartan is too expensive -Serum RAST testing for allergy -raise head of bed -f/u 6 weeks

## 2014-02-05 NOTE — Patient Instructions (Signed)
Take Delsym at night for cough Stop lisinopril Start taking losartan one pill daily for cough Raise the head of your bed We will see you back in 2 months or sooner if needed

## 2014-02-05 NOTE — Assessment & Plan Note (Signed)
Even though she has an extensive smoking history a don't think she has COPD. Her lung function tests were completely normal. Further, her shortness of breath only really occurs after prolonged coughing paroxysms at night when lying flat. See discussion above.

## 2014-02-05 NOTE — Assessment & Plan Note (Signed)
Advised at length to quit 

## 2014-02-05 NOTE — Progress Notes (Signed)
Subjective:    Patient ID: Marcia [name], female    DOB: 02-May-1971, 43 y.o.   MRN: [id number]  HPI  This is a very pleasant 43 year old female with a past medical history significant for diabetes he comes to my clinic today for evaluation of cough. She says that she has been told recently that she has COPD. She never had breathing trouble as a child or any sort respiratory illnesses. However, for about the last year she has had a cough. She says the cough occurs almost exclusively in the evenings. This is after she lays flat to go to bed. It is a dry cough and can be lengthy at times. Often after long coughing paroxysms she'll get chest tightness and shortness of breath. She says that the cough will sometimes occur during the daytime when she talks too much.  She tells me that the shortness of breath only really occurs during the nighttime after long coughing paroxysms. She does not have shortness of breath on exertion when she tries to keep up with her children or she exercises. She does exercise 3 times a week and she says she never has shortness of breath with any of that activity.  She has not have postnasal drip or acid reflux.  Past Medical History  Diagnosis Date  . Vulvar lesion   . Myocardial infarction 1997    At age 43  . Diabetes mellitus   . History of HPV infection   . Heart murmur   . Hay fever     Allergies  . Vaginal cancer   . Thyroid disease   . Hypercholesterolemia   . Hypothyroidism   . Tobacco abuse      Family History  Problem Relation Age of Onset  . Cancer Other     Aunt-breast; grandmother- lung and ovarian  . Diabetes Father   . Hypertension Mother   . Hyperlipidemia Mother   . Aneurysm Brother      History   Social History  . Marital Status: Divorced    Spouse Name: N/A    Number of Children: N/A  . Years of Education: N/A   Occupational History  . Not on file.   Social History Main Topics  . Smoking status: Current Every Day  Smoker -- 1.00 packs/day for 22 years    Types: Cigarettes  . Smokeless tobacco: Never Used     Comment: Down to .5ppd, trying to quit.  Allergic to chantix and e-cigs 02/05/14  . Alcohol Use: No  . Drug Use: No  . Sexual Activity: Yes   Other Topics Concern  . Not on file   Social History Narrative   No regular exercise.   Lives with spouse.     Allergies  Allergen Reactions  . Chantix [Varenicline] Rash     Outpatient Prescriptions Prior to Visit  Medication Sig Dispense Refill  . albuterol (PROVENTIL HFA;VENTOLIN HFA) 108 (90 BASE) MCG/ACT inhaler Inhale 2 puffs into the lungs every 6 (six) hours as needed for wheezing or shortness of breath.  1 Inhaler  11  . aspirin 81 MG EC tablet TAKE 1 TABLET (81 MG TOTAL) BY MOUTH DAILY.  30 tablet  11  . chlorpheniramine-HYDROcodone (TUSSIONEX PENNKINETIC ER) 10-8 MG/5ML LQCR Take 5 mLs by mouth every 12 (twelve) hours as needed for cough.  200 mL  0  . Cholecalciferol (VITAMIN D3) 50000 UNITS CAPS Take 50,000 capsules by mouth once a week.  4 capsule  2  . citalopram (CELEXA)  10 MG tablet TAKE 1 TABLET BY MOUTH EVERY DAY  30 tablet  2  . estradiol (VIVELLE-DOT) 0.05 MG/24HR patch Place 1 patch onto the skin once a week.      . Fluticasone-Salmeterol (ADVAIR DISKUS) 250-50 MCG/DOSE AEPB Inhale 1 puff into the lungs 2 (two) times daily.  60 each  6  . HUMALOG 100 UNIT/ML injection USE AS DIRECTED FOR INSULIN PUMP *TOTAL OF 60 UNITS PER DAY* *NEEDS APPT*  20 mL  1  . Insulin Infusion Pump Supplies (PARADIGM QUICK-SET 18" ) MISC 1 Device by Does not apply route every 3 (three) days.      . Insulin Infusion Pump Supplies (PARADIGM RESERVOIR ) MISC 1 Device by Does not apply route every 3 (three) days.      . insulin lispro (HUMALOG) 100 UNIT/ML injection Used as ordered for Insulin pump, total of 60 units per day.  20 mL  0  . levothyroxine (SYNTHROID, LEVOTHROID) 112 MCG tablet Take 1 tablet (112 mcg total) by mouth daily.  30 tablet  7    . lisinopril (PRINIVIL,ZESTRIL) 2.5 MG tablet Take 1 tablet (2.5 mg total) by mouth daily.  90 tablet  1  . metoprolol tartrate (LOPRESSOR) 25 MG tablet Take 1 tablet (25 mg total) by mouth 2 (two) times daily.  180 tablet  3  . NON FORMULARY Humalog Insulin Pump.       Marland Kitchen omeprazole (PRILOSEC) 20 MG capsule Take 1 capsule (20 mg total) by mouth daily.  90 capsule  3  . simvastatin (ZOCOR) 40 MG tablet Take 40 mg by mouth every evening.      Marland Kitchen Spacer/Aero-Holding Deretha Emory KIT For use with [facility] and [facility] please substitute the appropriate spacer needed  1 each  1  . Tiotropium Bromide Monohydrate (SPIRIVA RESPIMAT) 2.5 MCG/ACT AERS Inhale 2 puffs into the lungs daily.  4 g  5  . ALPRAZolam (XANAX) 0.5 MG tablet Take 1 tablet (0.5 mg total) by mouth at bedtime as needed for sleep.  30 tablet  2  . Lactulose 20 GM/30ML SOLN Take 30 mLs (20 g total) by mouth every 6 (six) hours as needed. To relieve constipation  240 mL  5  . tiotropium (SPIRIVA HANDIHALER) 18 MCG inhalation capsule Place 1 capsule (18 mcg total) into inhaler and inhale daily.  30 capsule  0   No facility-administered medications prior to visit.      Review of Systems  Constitutional: Negative for fever and unexpected weight change.  HENT: Negative for congestion, dental problem, ear pain, nosebleeds, postnasal drip, rhinorrhea, sinus pressure, sneezing, sore throat and trouble swallowing.   Eyes: Negative for redness and itching.  Respiratory: Positive for cough, shortness of breath and wheezing. Negative for chest tightness.   Cardiovascular: Negative for palpitations and leg swelling.  Gastrointestinal: Negative for nausea and vomiting.  Genitourinary: Negative for dysuria.  Musculoskeletal: Negative for joint swelling.  Skin: Negative for rash.  Neurological: Negative for headaches.  Hematological: Does not bruise/bleed easily.  Psychiatric/Behavioral: Negative for dysphoric mood. The patient is not  nervous/anxious.        Objective:   Physical Exam Filed Vitals:   02/05/14 1623  BP: 124/68  Pulse: 86  Height: 5\' 2"  (1.575 m)  Weight: 146 lb (66.225 kg)  SpO2: 100%    Gen: well appearing, no acute distress HEENT: NCAT, PERRL, EOMi, OP clear, neck supple without masses PULM: CTA B CV: RRR, no mgr, no JVD AB: BS+, soft, nontender,  no hsm Ext: warm, no edema, no clubbing, no cyanosis Derm: no rash or skin breakdown Neuro: A&Ox4, CN II-XII intact, strength 5/5 in all 4 extremities  December 2014 chest x-ray reviewed> normal     Assessment & Plan:   Cough Her primary problem is cough. I have reviewed her pulmonary function testing and they were completely normal and not consistent with COPD. See discussion below.  I explained to her that I feel like her cough is most likely due to either the lisinopril or due to an allergy to something in her bedroom. She says that she only gets the cough when she lays down to go to sleep and actually recently when she visited the [place] for several days she did not have a cough when she slept then. She does not have postnasal drip or acid reflux symptoms to suggest that that is the etiology.  Obviously her ongoing tobacco use contributes to her cough as well.  Plan: - Quit smoking quit smoking quit smoking -Stop lisinopril -Start losartan or another angiotensin receptor blocker if losartan is too expensive -Serum RAST testing for allergy -raise head of bed -f/u 6 weeks  COPD (chronic obstructive pulmonary disease) Even though she has an extensive smoking history a don't think she has COPD. Her lung function tests were completely normal. Further, her shortness of breath only really occurs after prolonged coughing paroxysms at night when lying flat. See discussion above.  Tobacco abuse Advised at length to quit   Updated Medication List Outpatient Encounter Prescriptions as of 02/05/2014  Medication Sig  . albuterol (PROVENTIL  HFA;VENTOLIN HFA) 108 (90 BASE) MCG/ACT inhaler Inhale 2 puffs into the lungs every 6 (six) hours as needed for wheezing or shortness of breath.  Marland Kitchen aspirin 81 MG EC tablet TAKE 1 TABLET (81 MG TOTAL) BY MOUTH DAILY.  . chlorpheniramine-HYDROcodone (TUSSIONEX PENNKINETIC ER) 10-8 MG/5ML LQCR Take 5 mLs by mouth every 12 (twelve) hours as needed for cough.  . Cholecalciferol (VITAMIN D3) 50000 UNITS CAPS Take 50,000 capsules by mouth once a week.  . citalopram (CELEXA) 10 MG tablet TAKE 1 TABLET BY MOUTH EVERY DAY  . estradiol (VIVELLE-DOT) 0.05 MG/24HR patch Place 1 patch onto the skin once a week.  . Fluticasone-Salmeterol (ADVAIR DISKUS) 250-50 MCG/DOSE AEPB Inhale 1 puff into the lungs 2 (two) times daily.  Marland Kitchen HUMALOG 100 UNIT/ML injection USE AS DIRECTED FOR INSULIN PUMP *TOTAL OF 60 UNITS PER DAY* *NEEDS APPT*  . Insulin Infusion Pump Supplies (PARADIGM QUICK-SET 18" 6MM) MISC 1 Device by Does not apply route every 3 (three) days.  . Insulin Infusion Pump Supplies (PARADIGM RESERVOIR 3ML) MISC 1 Device by Does not apply route every 3 (three) days.  . insulin lispro (HUMALOG) 100 UNIT/ML injection Used as ordered for Insulin pump, total of 60 units per day.  . levothyroxine (SYNTHROID, LEVOTHROID) 112 MCG tablet Take 1 tablet (112 mcg total) by mouth daily.  . metoprolol tartrate (LOPRESSOR) 25 MG tablet Take 1 tablet (25 mg total) by mouth 2 (two) times daily.  . NON FORMULARY Humalog Insulin Pump.   Marland Kitchen omeprazole (PRILOSEC) 20 MG capsule Take 1 capsule (20 mg total) by mouth daily.  . simvastatin (ZOCOR) 40 MG tablet Take 40 mg by mouth every evening.  Marland Kitchen Spacer/Aero-Holding Josiah Lobo KIT For use with [facility] and [facility] please substitute the appropriate spacer needed  . Tiotropium Bromide Monohydrate (SPIRIVA RESPIMAT) 2.5 MCG/ACT AERS Inhale 2 puffs into the lungs daily.  . [DISCONTINUED] lisinopril (PRINIVIL,ZESTRIL)  2.5 MG tablet Take 1 tablet (2.5 mg total) by mouth daily.  Marland Kitchen  losartan (COZAAR) 100 MG tablet Take 1 tablet (100 mg total) by mouth daily.  . [DISCONTINUED] ALPRAZolam (XANAX) 0.5 MG tablet Take 1 tablet (0.5 mg total) by mouth at bedtime as needed for sleep.  . [DISCONTINUED] Lactulose 20 GM/30ML SOLN Take 30 mLs (20 g total) by mouth every 6 (six) hours as needed. To relieve constipation  . [DISCONTINUED] tiotropium (SPIRIVA HANDIHALER) 18 MCG inhalation capsule Place 1 capsule (18 mcg total) into inhaler and inhale daily.

## 2014-02-09 ENCOUNTER — Other Ambulatory Visit (INDEPENDENT_AMBULATORY_CARE_PROVIDER_SITE_OTHER): Payer: Medicaid Other

## 2014-02-09 DIAGNOSIS — R059 Cough, unspecified: Secondary | ICD-10-CM

## 2014-02-09 DIAGNOSIS — R05 Cough: Secondary | ICD-10-CM

## 2014-02-10 ENCOUNTER — Encounter: Payer: Self-pay | Admitting: Endocrinology

## 2014-02-10 ENCOUNTER — Ambulatory Visit (INDEPENDENT_AMBULATORY_CARE_PROVIDER_SITE_OTHER): Payer: Medicaid Other | Admitting: Endocrinology

## 2014-02-10 VITALS — BP 132/82 | HR 91 | Temp 98.5°F | Ht 62.0 in | Wt 145.0 lb

## 2014-02-10 DIAGNOSIS — E1065 Type 1 diabetes mellitus with hyperglycemia: Principal | ICD-10-CM

## 2014-02-10 DIAGNOSIS — E1049 Type 1 diabetes mellitus with other diabetic neurological complication: Secondary | ICD-10-CM

## 2014-02-10 DIAGNOSIS — IMO0002 Reserved for concepts with insufficient information to code with codable children: Secondary | ICD-10-CM | POA: Insufficient documentation

## 2014-02-10 DIAGNOSIS — Z23 Encounter for immunization: Secondary | ICD-10-CM

## 2014-02-10 LAB — HEMOGLOBIN A1C: Hgb A1c MFr Bld: 10.4 % — ABNORMAL HIGH (ref 4.6–6.5)

## 2014-02-10 LAB — ALLERGY FULL PROFILE
ALLERGEN, D PTERNOYSSINUS, D1: 0.1 kU/L — AB
Allergen,Goose feathers, e70: <0.1 kU/L
Alternaria Alternata: <0.1 kU/L
Bermuda Grass: <0.1 kU/L
Box Elder IgE: <0.1 kU/L
Candida Albicans: 0.22 kU/L — ABNORMAL HIGH
Cat Dander: <0.1 kU/L
Curvularia lunata: <0.1 kU/L
Dog Dander: <0.1 kU/L
Fescue: <0.1 kU/L
Goldenrod: <0.1 kU/L
Helminthosporium halodes: <0.1 kU/L
IgE (Immunoglobulin E), Serum: 161.3 IU/mL (ref 0.0–180.0)
Lamb's Quarters: <0.1 kU/L
Stemphylium Botryosum: <0.1 kU/L

## 2014-02-10 NOTE — Progress Notes (Signed)
Subjective:    Patient ID: Marcia [name], female    DOB: October 10, 1970, 43 y.o.   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 She takes a total of approx 45 units total per day.  She denies pump problems.  no cbg record, but states cbg's are still frequently low in the middle of the night.   Past Medical History  Diagnosis Date  . Vulvar lesion   . Myocardial infarction 1997    At age 28  . Diabetes mellitus   . History of HPV infection   . Heart murmur   . Hay fever     Allergies  . Vaginal cancer   . Thyroid disease   . Hypercholesterolemia   . Hypothyroidism   . Tobacco abuse     Past Surgical History  Procedure Laterality Date  . Ankle surgery  2010  . Tubal ligation  2004  . Tubal ligation  2004, [facility]  . Abdominal hysterectomy  10/12/12    History   Social History  . Marital Status: Divorced    Spouse Name: N/A    Number of Children: N/A  . Years of Education: N/A   Occupational History  . Not on file.   Social History Main Topics  . Smoking status: Current Every Day Smoker -- 1.00 packs/day for 22 years    Types: Cigarettes  . Smokeless tobacco: Never Used     Comment: Down to .5ppd, trying to quit.  Allergic to chantix and e-cigs 02/05/14  . Alcohol Use: No  . Drug Use: No  . Sexual Activity: Yes   Other Topics Concern  . Not on file   Social History Narrative   No regular exercise.   Lives with spouse.    Current Outpatient Prescriptions on File Prior to Visit  Medication Sig Dispense Refill  . albuterol (PROVENTIL HFA;VENTOLIN HFA) 108 (90 BASE) MCG/ACT inhaler Inhale 2 puffs  into the lungs every 6 (six) hours as needed for wheezing or shortness of breath.  1 Inhaler  11  . aspirin 81 MG EC tablet TAKE 1 TABLET (81 MG TOTAL) BY MOUTH DAILY.  30 tablet  11  . chlorpheniramine-HYDROcodone (TUSSIONEX PENNKINETIC ER) 10-8 MG/5ML LQCR Take 5 mLs by mouth every 12 (twelve) hours as needed for cough.  200 mL  0  . Cholecalciferol (VITAMIN D3) 50000 UNITS CAPS Take 50,000 capsules by mouth once a week.  4 capsule  2  . citalopram (CELEXA) 10 MG tablet TAKE 1 TABLET BY MOUTH EVERY DAY  30 tablet  2  . estradiol (VIVELLE-DOT) 0.05 MG/24HR patch Place 1 patch onto the skin once a week.      . Fluticasone-Salmeterol (ADVAIR DISKUS) 250-50 MCG/DOSE AEPB Inhale 1 puff into the lungs 2 (two) times daily.  60 each  6  . HUMALOG 100 UNIT/ML injection USE AS DIRECTED FOR INSULIN PUMP *TOTAL OF 60 UNITS PER DAY* *NEEDS APPT*  20 mL  1  . Insulin Infusion Pump Supplies (PARADIGM QUICK-SET 18" 6MM) MISC 1 Device by Does not apply route every 3 (three) days.      . Insulin  Infusion Pump Supplies (PARADIGM RESERVOIR 3ML) MISC 1 Device by Does not apply route every 3 (three) days.      . insulin lispro (HUMALOG) 100 UNIT/ML injection Used as ordered for Insulin pump, total of 60 units per day.  20 mL  0  . levothyroxine (SYNTHROID, LEVOTHROID) 112 MCG tablet Take 1 tablet (112 mcg total) by mouth daily.  30 tablet  7  . losartan (COZAAR) 100 MG tablet Take 1 tablet (100 mg total) by mouth daily.  30 tablet  11  . metoprolol tartrate (LOPRESSOR) 25 MG tablet Take 1 tablet (25 mg total) by mouth 2 (two) times daily.  180 tablet  3  . NON FORMULARY Humalog Insulin Pump.       Marland Kitchen omeprazole (PRILOSEC) 20 MG capsule Take 1 capsule (20 mg total) by mouth daily.  90 capsule  3  . simvastatin (ZOCOR) 40 MG tablet Take 40 mg by mouth every evening.      Marland Kitchen Spacer/Aero-Holding Josiah Lobo KIT For use with [facility] and [facility] please substitute the appropriate spacer needed  1 each  1  .  Tiotropium Bromide Monohydrate (SPIRIVA RESPIMAT) 2.5 MCG/ACT AERS Inhale 2 puffs into the lungs daily.  4 g  5   No current facility-administered medications on file prior to visit.    Allergies  Allergen Reactions  . Chantix [Varenicline] Rash    Family History  Problem Relation Age of Onset  . Cancer Other     Aunt-breast; grandmother- lung and ovarian  . Diabetes Father   . Hypertension Mother   . Hyperlipidemia Mother   . Aneurysm Brother     BP 132/82  Pulse 91  Temp(Src) 98.5 F (36.9 C) (Oral)  Ht $R'5\' 2"'KO$  (1.575 m)  Wt 145 lb (65.772 kg)  BMI 26.51 kg/m2  SpO2 95%  LMP 10/12/2012  Review of Systems She denies hypoglycemia and weight change.      Objective:   Physical Exam VITAL SIGNS:  See vs page GENERAL: no distress Pulses: dorsalis pedis intact bilat.   Feet: no deformity. normal color and temp.  no edema.  There is bilateral onychomycosis Skin:  no ulcer on the feet.   Neuro: sensation is intact to touch on the feet, but decreased from normal.   Lab Results  Component Value Date   HGBA1C 10.4* 02/10/2014  i reviewed electrocardiogram (2014)    Assessment & Plan:  DM: severe exacerbation.   Noncompliance with cbg recording and f/u appts: I'll work around this as best I can.    Patient is advised the following: Patient Instructions  check your blood sugar 4 times a day: before the 3 meals, and at bedtime.  also check if you have symptoms of your blood sugar being too high or too low.  please keep a record of the readings and bring it to your next appointment here.  You can write it on any piece of paper.  please call us sooner if your blood sugar goes below 70, or if you have a lot of readings over 200.   Please reduce your basal rate to 0.8 unit/hr, 24 hrs per day.  Please continue your mealtime bolus, 1 unit/5 grams of carbohydrate.  continue correction bolus (which some people call "sensitivity," or "insulin sensitivity ratio," or just "isr") of 1 unit  for each 40 by which your glucose exceeds 100 Please come back for a follow-up appointment in 3 months.  blood tests are being requested for you today.  We'll contact you with results.

## 2014-02-10 NOTE — Patient Instructions (Addendum)
check your blood sugar 4 times a day: before the 3 meals, and at bedtime.  also check if you have symptoms of your blood sugar being too high or too low.  please keep a record of the readings and bring it to your next appointment here.  You can write it on any piece of paper.  please call us sooner if your blood sugar goes below 70, or if you have a lot of readings over 200.   Please reduce your basal rate to 0.8 unit/hr, 24 hrs per day.  Please continue your mealtime bolus, 1 unit/5 grams of carbohydrate.  continue correction bolus (which some people call "sensitivity," or "insulin sensitivity ratio," or just "isr") of 1 unit for each 40 by which your glucose exceeds 100 Please come back for a follow-up appointment in 3 months.  blood tests are being requested for you today.  We'll contact you with results.

## 2014-02-12 ENCOUNTER — Other Ambulatory Visit: Payer: Self-pay | Admitting: Endocrinology

## 2014-02-14 ENCOUNTER — Other Ambulatory Visit: Payer: Self-pay | Admitting: Internal Medicine

## 2014-02-18 ENCOUNTER — Ambulatory Visit: Payer: Medicaid Other | Admitting: Endocrinology

## 2014-02-27 ENCOUNTER — Ambulatory Visit (INDEPENDENT_AMBULATORY_CARE_PROVIDER_SITE_OTHER): Payer: Medicaid Other | Admitting: Endocrinology

## 2014-02-27 ENCOUNTER — Encounter: Payer: Self-pay | Admitting: Endocrinology

## 2014-02-27 VITALS — BP 132/84 | HR 73 | Temp 98.0°F | Ht 62.0 in | Wt 147.0 lb

## 2014-02-27 DIAGNOSIS — E1049 Type 1 diabetes mellitus with other diabetic neurological complication: Secondary | ICD-10-CM

## 2014-02-27 DIAGNOSIS — E1065 Type 1 diabetes mellitus with hyperglycemia: Principal | ICD-10-CM

## 2014-02-27 MED ORDER — INSULIN ASPART PROT & ASPART (70-30 MIX) 100 UNIT/ML PEN: PEN_INJECTOR | SUBCUTANEOUS | Status: DC

## 2014-02-27 NOTE — Progress Notes (Signed)
Subjective:    Patient ID: Marcia [name], female    DOB: [birth year], 43 y.o.   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 She takes a total of approx 45 units total per day.  She denies pump problems.  She wants to have a trial off the pump.  pt states she feels well in general. Past Medical History  Diagnosis Date  . Vulvar lesion   . Myocardial infarction 1997    At age 25  . Diabetes mellitus   . History of HPV infection   . Heart murmur   . Hay fever     Allergies  . Vaginal cancer   . Thyroid disease   . Hypercholesterolemia   . Hypothyroidism   . Tobacco abuse     Past Surgical History  Procedure Laterality Date  . Ankle surgery  2010  . Tubal ligation  2004  . Tubal ligation  2004, [facility]  . Abdominal hysterectomy  10/12/12    History   Social History  . Marital Status: Divorced    Spouse Name: N/A    Number of Children: N/A  . Years of Education: N/A   Occupational History  . Not on file.   Social History Main Topics  . Smoking status: Current Every Day Smoker -- 1.00 packs/day for 22 years    Types: Cigarettes  . Smokeless tobacco: Never Used     Comment: Down to .5ppd, trying to quit.  Allergic to chantix and e-cigs 02/05/14  . Alcohol Use: No  . Drug Use: No  . Sexual Activity: Yes   Other Topics Concern  . Not on file   Social History Narrative   No regular exercise.   Lives with spouse.    Current Outpatient Prescriptions on File Prior to Visit  Medication Sig Dispense Refill  . albuterol (PROVENTIL HFA;VENTOLIN HFA) 108 (90 BASE) MCG/ACT inhaler Inhale 2 puffs into the  lungs every 6 (six) hours as needed for wheezing or shortness of breath.  1 Inhaler  11  . aspirin 81 MG EC tablet TAKE 1 TABLET (81 MG TOTAL) BY MOUTH DAILY.  30 tablet  11  . chlorpheniramine-HYDROcodone (TUSSIONEX PENNKINETIC ER) 10-8 MG/5ML LQCR Take 5 mLs by mouth every 12 (twelve) hours as needed for cough.  200 mL  0  . Cholecalciferol (VITAMIN D3) 50000 UNITS CAPS Take 50,000 capsules by mouth once a week.  4 capsule  2  . citalopram (CELEXA) 10 MG tablet TAKE 1 TABLET BY MOUTH EVERY DAY  30 tablet  2  . estradiol (VIVELLE-DOT) 0.05 MG/24HR patch Place 1 patch onto the skin once a week.      . Fluticasone-Salmeterol (ADVAIR DISKUS) 250-50 MCG/DOSE AEPB Inhale 1 puff into the lungs 2 (two) times daily.  60 each  6  . levothyroxine (SYNTHROID, LEVOTHROID) 112 MCG tablet Take 1 tablet (112 mcg total) by mouth daily.  30 tablet  7  . losartan (COZAAR) 100 MG tablet Take 1 tablet (100 mg total) by mouth daily.  30 tablet  11  . metoprolol tartrate (LOPRESSOR) 25 MG tablet Take 1 tablet (25  mg total) by mouth 2 (two) times daily.  180 tablet  3  . NON FORMULARY Humalog Insulin Pump.       Marland Kitchen omeprazole (PRILOSEC) 20 MG capsule Take 1 capsule (20 mg total) by mouth daily.  90 capsule  3  . simvastatin (ZOCOR) 40 MG tablet Take 40 mg by mouth every evening.      Marland Kitchen Spacer/Aero-Holding Josiah Lobo KIT For use with [facility] and [facility] please substitute the appropriate spacer needed  1 each  1  . Tiotropium Bromide Monohydrate (SPIRIVA RESPIMAT) 2.5 MCG/ACT AERS Inhale 2 puffs into the lungs daily.  4 g  5   No current facility-administered medications on file prior to visit.    Allergies  Allergen Reactions  . Chantix [Varenicline] Rash    Family History  Problem Relation Age of Onset  . Cancer Other     Aunt-breast; grandmother- lung and ovarian  . Diabetes Father   . Hypertension Mother   . Hyperlipidemia Mother   . Aneurysm Brother     BP 132/84  Pulse 73  Temp(Src) 98  F (36.7 C) (Oral)  Ht _0  (1.575 m)  Wt 147 lb (66.679 kg)  BMI 26.88 kg/m2  SpO2 96%  LMP 10/12/2012   Review of Systems She denies hypoglycemia and n/v    Objective:   Physical Exam VITAL SIGNS:  See vs page GENERAL: no distress SKIN:  Insulin infusion sites at the anterior abdomen are normal.   Lab Results  Component Value Date   HGBA1C 10.4* 02/10/2014      Assessment & Plan:  DM: severe exacerbation  Patient is advised the following: Patient Instructions  Please change the pump to the twice a day insulin.  i have sent a prescription to your pharmacy.  Please make this change on Monday morning.   Please come back for a follow-up appointment in 1 month.   check your blood sugar 4 times a day.  vary the time of day when you check, between before the 3 meals, and at bedtime.  also check if you have symptoms of your blood sugar being too high or too low.  please keep a record of the readings and bring it to your next appointment here.  You can write it on any piece of paper.  please call us sooner if your blood sugar goes below 70, or if you have a lot of readings over 200. We can go back to the pump if we decide to.

## 2014-02-27 NOTE — Patient Instructions (Addendum)
Please change the pump to the twice a day insulin.  i have sent a prescription to your pharmacy.  Please make this change on Monday morning.   Please come back for a follow-up appointment in 1 month.   check your blood sugar 4 times a day.  vary the time of day when you check, between before the 3 meals, and at bedtime.  also check if you have symptoms of your blood sugar being too high or too low.  please keep a record of the readings and bring it to your next appointment here.  You can write it on any piece of paper.  please call us sooner if your blood sugar goes below 70, or if you have a lot of readings over 200. We can go back to the pump if we decide to.

## 2014-03-30 ENCOUNTER — Ambulatory Visit (INDEPENDENT_AMBULATORY_CARE_PROVIDER_SITE_OTHER): Payer: Medicaid Other | Admitting: Endocrinology

## 2014-03-30 ENCOUNTER — Encounter: Payer: Self-pay | Admitting: Endocrinology

## 2014-03-30 VITALS — BP 124/80 | HR 82 | Temp 98.2°F | Ht 62.0 in | Wt 141.0 lb

## 2014-03-30 DIAGNOSIS — E1065 Type 1 diabetes mellitus with hyperglycemia: Principal | ICD-10-CM

## 2014-03-30 DIAGNOSIS — E1049 Type 1 diabetes mellitus with other diabetic neurological complication: Secondary | ICD-10-CM

## 2014-03-30 MED ORDER — INSULIN GLARGINE 100 UNIT/ML SOLOSTAR PEN: 30.0000 [IU] | PEN_INJECTOR | SUBCUTANEOUS | Status: DC

## 2014-03-30 MED ORDER — INSULIN ASPART PROT & ASPART (70-30 MIX) 100 UNIT/ML PEN: 5.0000 [IU] | PEN_INJECTOR | Freq: Every day | SUBCUTANEOUS | Status: DC

## 2014-03-30 NOTE — Progress Notes (Signed)
Subjective:    Patient ID: Marcia [name], female    DOB: 03-09-1971, 43 y.o.   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  she brings a record of her cbg's which i have reviewed today.  It varies from 65-400, but most are in the 200's.  There is no trend throughout the day.  She is unable to cite any reasons for the differences in her cbg's.   Past Medical History  Diagnosis Date  . Vulvar lesion   . Myocardial infarction 1997    At age 79  . Diabetes mellitus   . History of HPV infection   . Heart murmur   . Hay fever     Allergies  . Vaginal cancer   . Thyroid disease   . Hypercholesterolemia   . Hypothyroidism   . Tobacco abuse     Past Surgical History  Procedure Laterality Date  . Ankle surgery  2010  . Tubal ligation  2004  . Tubal ligation  2004, [facility]  . Abdominal hysterectomy  10/12/12    History   Social History  . Marital Status: Divorced    Spouse Name: N/A    Number of Children: N/A  . Years of Education: N/A   Occupational History  . Not on file.   Social History Main Topics  . Smoking status: Current Every Day Smoker -- 1.00 packs/day for 22 years    Types: Cigarettes  . Smokeless tobacco: Never Used     Comment: Down to .5ppd, trying to quit.  Allergic to chantix and e-cigs 02/05/14  . Alcohol Use: No  . Drug Use: No  . Sexual Activity: Yes   Other Topics Concern  . Not on file   Social History Narrative   No regular exercise.   Lives with spouse.    Current Outpatient Prescriptions on File Prior to Visit    Medication Sig Dispense Refill  . albuterol (PROVENTIL HFA;VENTOLIN HFA) 108 (90 BASE) MCG/ACT inhaler Inhale 2 puffs into the lungs every 6 (six) hours as needed for wheezing or shortness of breath.  1 Inhaler  11  . aspirin 81 MG EC tablet TAKE 1 TABLET (81 MG TOTAL) BY MOUTH DAILY.  30 tablet  11  . Cholecalciferol (VITAMIN D3) 50000 UNITS CAPS Take 50,000 capsules by mouth once a week.  4 capsule  2  . citalopram (CELEXA) 10 MG tablet TAKE 1 TABLET BY MOUTH EVERY DAY  30 tablet  2  . estradiol (VIVELLE-DOT) 0.05 MG/24HR patch Place 1 patch onto the skin once a week.      . Fluticasone-Salmeterol (ADVAIR DISKUS) 250-50 MCG/DOSE AEPB Inhale 1 puff into the lungs 2 (two) times daily.  60 each  6  . levothyroxine (SYNTHROID, LEVOTHROID) 112 MCG tablet Take 1 tablet (112 mcg total) by mouth daily.  30 tablet  7  . losartan (COZAAR) 100 MG tablet Take 1 tablet (100 mg total) by mouth daily.  30 tablet  11  . metoprolol tartrate (LOPRESSOR) 25 MG tablet Take 1 tablet (25 mg  total) by mouth 2 (two) times daily.  180 tablet  3  . NON FORMULARY Humalog Insulin Pump.       Marland Kitchen omeprazole (PRILOSEC) 20 MG capsule Take 1 capsule (20 mg total) by mouth daily.  90 capsule  3  . simvastatin (ZOCOR) 40 MG tablet Take 40 mg by mouth every evening.      Marland Kitchen Spacer/Aero-Holding Josiah Lobo KIT For use with [facility] and [facility] please substitute the appropriate spacer needed  1 each  1  . Tiotropium Bromide Monohydrate (SPIRIVA RESPIMAT) 2.5 MCG/ACT AERS Inhale 2 puffs into the lungs daily.  4 g  5   No current facility-administered medications on file prior to visit.    Allergies  Allergen Reactions  . Chantix [Varenicline] Rash    Family History  Problem Relation Age of Onset  . Cancer Other     Aunt-breast; grandmother- lung and ovarian  . Diabetes Father   . Hypertension Mother   . Hyperlipidemia Mother   . Aneurysm Brother     BP 124/80  Pulse 82  Temp(Src) 98.2 F (36.8 C) (Oral)   Ht $R'5\' 2"'nl$  (1.575 m)  Wt 141 lb (63.957 kg)  BMI 25.78 kg/m2  SpO2 96%  LMP 10/12/2012   Review of Systems She denies LOC and weight change.    Objective:   Physical Exam VITAL SIGNS:  See vs page GENERAL: no distress Pulses: dorsalis pedis intact bilat.   Feet: no deformity. normal color and temp.  no edema.  There is bilateral onychomycosis.  Skin:  no ulcer on the feet.   Neuro: sensation is intact to touch on the feet   Lab Results  Component Value Date   HGBA1C 10.4* 02/10/2014  i reviewed electrocardiogram (sept, 2014)     Assessment & Plan:  DM: severe exacerbation: she needs to find a simple regimen that works for her. Noncompliance with cbg recording, much better. ecg c/w pulm dz.  In this setting, she is at risk for developing heart dz, so she needs better glycemic control.    Patient is advised the following: Patient Instructions  Please change the insulin to lantus each morning, and novolog with the evening meal.  i have sent prescriptions to your pharmacy. Please come back for a follow-up appointment in 2 weeks check your blood sugar 4 times a day: before the 3 meals, and at bedtime.  also check if you have symptoms of your blood sugar being too high or too low.  please keep a record of the readings and bring it to your next appointment here.  You can write it on any piece of paper.  please call us sooner if your blood sugar goes below 70, or if you have a lot of readings over 200.

## 2014-03-30 NOTE — Patient Instructions (Signed)
Please change the insulin to lantus each morning, and novolog with the evening meal.  i have sent prescriptions to your pharmacy. Please come back for a follow-up appointment in 2 weeks check your blood sugar 4 times a day: before the 3 meals, and at bedtime.  also check if you have symptoms of your blood sugar being too high or too low.  please keep a record of the readings and bring it to your next appointment here.  You can write it on any piece of paper.  please call us sooner if your blood sugar goes below 70, or if you have a lot of readings over 200.

## 2014-04-09 ENCOUNTER — Encounter: Payer: Self-pay | Admitting: Internal Medicine

## 2014-04-09 ENCOUNTER — Ambulatory Visit (INDEPENDENT_AMBULATORY_CARE_PROVIDER_SITE_OTHER): Payer: Medicaid Other | Admitting: Internal Medicine

## 2014-04-09 VITALS — BP 118/54 | HR 73 | Temp 98.0°F | Wt 142.0 lb

## 2014-04-09 DIAGNOSIS — L0231 Cutaneous abscess of buttock: Secondary | ICD-10-CM

## 2014-04-09 DIAGNOSIS — L03317 Cellulitis of buttock: Principal | ICD-10-CM

## 2014-04-09 MED ORDER — DOXYCYCLINE HYCLATE 100 MG PO TABS: 100.0000 mg | ORAL_TABLET | Freq: Two times a day (BID) | ORAL | Status: DC

## 2014-04-09 NOTE — Addendum Note (Signed)
Addended by: Jearld Fenton on: 04/09/2014 11:30 AM   Modules accepted: Orders

## 2014-04-09 NOTE — Patient Instructions (Signed)

## 2014-04-09 NOTE — Progress Notes (Signed)
Pre visit review using our clinic review tool, if applicable. No additional management support is needed unless otherwise documented below in the visit note. 

## 2014-04-09 NOTE — Progress Notes (Signed)
Subjective:    Patient ID: Marcia [name], female    DOB: 1971-01-23, 43 y.o.   MRN: [id number]  HPI  Pt presents to the clinic today with c/o a boil on her buttocks. This started 3 days ago. The area is very firm and tender. It has not drained any fluid. She denies fever or chills. She has not tried anything OTC. She does have a history of DM. She has never had a boil that she is aware of.  Review of Systems  Past Medical History  Diagnosis Date  . Vulvar lesion   . Myocardial infarction 1997    At age 57  . Diabetes mellitus   . History of HPV infection   . Heart murmur   . Hay fever     Allergies  . Vaginal cancer   . Thyroid disease   . Hypercholesterolemia   . Hypothyroidism   . Tobacco abuse     Current Outpatient Prescriptions  Medication Sig Dispense Refill  . albuterol (PROVENTIL HFA;VENTOLIN HFA) 108 (90 BASE) MCG/ACT inhaler Inhale 2 puffs into the lungs every 6 (six) hours as needed for wheezing or shortness of breath.  1 Inhaler  11  . aspirin 81 MG EC tablet TAKE 1 TABLET (81 MG TOTAL) BY MOUTH DAILY.  30 tablet  11  . Cholecalciferol (VITAMIN D3) 50000 UNITS CAPS Take 50,000 capsules by mouth once a week.  4 capsule  2  . citalopram (CELEXA) 10 MG tablet TAKE 1 TABLET BY MOUTH EVERY DAY  30 tablet  2  . estradiol (VIVELLE-DOT) 0.05 MG/24HR patch Place 1 patch onto the skin once a week.      . Fluticasone-Salmeterol (ADVAIR DISKUS) 250-50 MCG/DOSE AEPB Inhale 1 puff into the lungs 2 (two) times daily.  60 each  6  . Insulin Aspart Prot & Aspart (NOVOLOG MIX 70/30 FLEXPEN) (70-30) 100 UNIT/ML Pen Inject 5 Units into the skin daily with supper.  15 mL  11  . Insulin Glargine (LANTUS SOLOSTAR) 100 UNIT/ML Solostar Pen Inject 30 Units into the skin every morning.  5 pen  PRN  . levothyroxine (SYNTHROID, LEVOTHROID) 112 MCG tablet Take 1 tablet (112 mcg total) by mouth daily.  30 tablet  7  . losartan (COZAAR) 100 MG tablet Take 1 tablet (100 mg total) by mouth  daily.  30 tablet  11  . metoprolol tartrate (LOPRESSOR) 25 MG tablet Take 1 tablet (25 mg total) by mouth 2 (two) times daily.  180 tablet  3  . NON FORMULARY Humalog Insulin Pump.       Marland Kitchen omeprazole (PRILOSEC) 20 MG capsule Take 1 capsule (20 mg total) by mouth daily.  90 capsule  3  . simvastatin (ZOCOR) 40 MG tablet Take 40 mg by mouth every evening.      Marland Kitchen Spacer/Aero-Holding Josiah Lobo KIT For use with [facility] and [facility] please substitute the appropriate spacer needed  1 each  1  . Tiotropium Bromide Monohydrate (SPIRIVA RESPIMAT) 2.5 MCG/ACT AERS Inhale 2 puffs into the lungs daily.  4 g  5   No current facility-administered medications for this visit.    Allergies  Allergen Reactions  . Chantix [Varenicline] Rash    Family History  Problem Relation Age of Onset  . Cancer Other     Aunt-breast; grandmother- lung and ovarian  . Diabetes Father   . Hypertension Mother   . Hyperlipidemia Mother   . Aneurysm Brother     History  Social History  . Marital Status: Divorced    Spouse Name: N/A    Number of Children: N/A  . Years of Education: N/A   Occupational History  . Not on file.   Social History Main Topics  . Smoking status: Current Every Day Smoker -- 1.00 packs/day for 22 years    Types: Cigarettes  . Smokeless tobacco: Never Used     Comment: Down to .5ppd, trying to quit.  Allergic to chantix and e-cigs 02/05/14  . Alcohol Use: No  . Drug Use: No  . Sexual Activity: Yes   Other Topics Concern  . Not on file   Social History Narrative   No regular exercise.   Lives with spouse.     Constitutional: Denies fever, malaise, fatigue, headache or abrupt weight changes.  Skin: Pt reports boil on buttock. Denies redness, rashes, lesions or ulcercations.    No other specific complaints in a complete review of systems (except as listed in HPI above).     Objective:   Physical Exam   BP 118/54  Pulse 73  Temp(Src) 98 F (36.7 C) (Oral)   Wt 142 lb (64.411 kg)  SpO2 97%  LMP 10/12/2012 Wt Readings from Last 3 Encounters:  04/09/14 142 lb (64.411 kg)  03/30/14 141 lb (63.957 kg)  02/27/14 147 lb (66.679 kg)    General: Appears her stated age, well developed, well nourished in NAD. Skin: Round dime size lump noted on right buttock. Firm and tender to touch with surrounding erythema. Not fluctuate. Cardiovascular: Normal rate and rhythm. S1,S2 noted.  No murmur, rubs or gallops noted.  Pulmonary/Chest: Normal effort and positive vesicular breath sounds. No respiratory distress. No wheezes, rales or ronchi noted.   BMET    Component Value Date/Time   NA 130* 11/11/2013 0924   K 5.5* 11/11/2013 0924   CL 98 11/11/2013 0924   CO2 24 11/11/2013 0924   GLUCOSE 370* 11/11/2013 0924   BUN 16 11/11/2013 0924   CREATININE 0.7 11/11/2013 0924   CREATININE 0.80 11/07/2011 1139   CALCIUM 9.0 11/11/2013 0924   GFRNONAA >89 11/07/2011 1139   GFRAA >89 11/07/2011 1139    Lipid Panel     Component Value Date/Time   CHOL 206* 11/11/2013 0924   TRIG 112.0 11/11/2013 0924   HDL 55.80 11/11/2013 0924   CHOLHDL 4 11/11/2013 0924   VLDL 22.4 11/11/2013 0924   LDLCALC 128* 11/11/2013 0924    CBC    Component Value Date/Time   WBC 13.7* 12/25/2012 0900   RBC 4.95 12/25/2012 0900   HGB 15.3* 12/25/2012 0900   HCT 44.6 12/25/2012 0900   PLT 253.0 12/25/2012 0900   MCV 90.0 12/25/2012 0900   MCHC 34.4 12/25/2012 0900   RDW 14.2 12/25/2012 0900   LYMPHSABS 2.1 12/25/2012 0900   MONOABS 1.0 12/25/2012 0900   EOSABS 0.1 12/25/2012 0900   BASOSABS 0.0 12/25/2012 0900    Hgb A1C Lab Results  Component Value Date   HGBA1C 10.4* 02/10/2014       Assessment & Plan:   Abscess noted of right buttock:  Will try doxycycline BID x 10 days Put warm compresses on the area TID to encourage it to drain Ibuprofen for pain  RTC as needed or if symptoms persist or worsen

## 2014-04-15 ENCOUNTER — Encounter: Payer: Self-pay | Admitting: Endocrinology

## 2014-04-15 ENCOUNTER — Ambulatory Visit (INDEPENDENT_AMBULATORY_CARE_PROVIDER_SITE_OTHER): Payer: Medicaid Other | Admitting: Endocrinology

## 2014-04-15 VITALS — BP 120/72 | HR 81 | Temp 98.2°F | Ht 62.0 in | Wt 143.0 lb

## 2014-04-15 DIAGNOSIS — E1065 Type 1 diabetes mellitus with hyperglycemia: Secondary | ICD-10-CM

## 2014-04-15 DIAGNOSIS — E1049 Type 1 diabetes mellitus with other diabetic neurological complication: Secondary | ICD-10-CM

## 2014-04-15 DIAGNOSIS — Z23 Encounter for immunization: Secondary | ICD-10-CM

## 2014-04-15 NOTE — Patient Instructions (Addendum)
Please continue the same insulins for now. Please come back for a follow-up appointment in 2 months check your blood sugar 4 times a day: before the 3 meals, and at bedtime.  The bedtime check is really important.  also check if you have symptoms of your blood sugar being too high or too low.  please keep a record of the readings and bring it to your next appointment here.  You can write it on any piece of paper.  please call us sooner if your blood sugar goes below 70, or if you have a lot of readings over 200.

## 2014-04-15 NOTE — Progress Notes (Signed)
Subjective:    Patient ID: Marcia [name], female    DOB: 18-Mar-1971, 43 y.o.   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  she brings a record of her cbg's which i have reviewed today.  It varies from 111-289, but most are in the 200's.  It is in general higher as the day goes on, but not necessarily so.  She does not check at hs.  Past Medical History  Diagnosis Date  . Vulvar lesion   . Myocardial infarction 1997    At age 51  . Diabetes mellitus   . History of HPV infection   . Heart murmur   . Hay fever     Allergies  . Vaginal cancer   . Thyroid disease   . Hypercholesterolemia   . Hypothyroidism   . Tobacco abuse     Past Surgical History  Procedure Laterality Date  . Ankle surgery  2010  . Tubal ligation  2004  . Tubal ligation  2004, [facility]  . Abdominal hysterectomy  10/12/12    History   Social History  . Marital Status: Divorced    Spouse Name: N/A    Number of Children: N/A  . Years of Education: N/A   Occupational History  . Not on file.   Social History Main Topics  . Smoking status: Current Every Day Smoker -- 1.00 packs/day for 22 years    Types: Cigarettes  . Smokeless tobacco: Never Used     Comment: Down to .5ppd, trying to quit.  Allergic to chantix and e-cigs 02/05/14  . Alcohol Use: No  . Drug Use: No  . Sexual Activity: Yes   Other Topics Concern  . Not on file   Social History Narrative   No regular exercise.   Lives with spouse.    Current Outpatient Prescriptions on File Prior to Visit  Medication Sig Dispense Refill  . albuterol (PROVENTIL HFA;VENTOLIN HFA) 108 (90 BASE) MCG/ACT inhaler Inhale 2 puffs into the lungs every 6 (six) hours as needed for wheezing or shortness of breath.  1 Inhaler  11  . aspirin 81 MG EC tablet TAKE 1 TABLET (81 MG TOTAL) BY MOUTH DAILY.  30 tablet  11  . Cholecalciferol (VITAMIN D3) 50000 UNITS CAPS Take 50,000 capsules by mouth once a week.  4 capsule  2  . citalopram (CELEXA) 10 MG tablet TAKE 1 TABLET BY MOUTH EVERY DAY  30 tablet  2  . doxycycline (VIBRA-TABS) 100 MG tablet Take 1 tablet (100 mg total) by mouth 2 (two) times daily.  20 tablet  0  . estradiol (VIVELLE-DOT) 0.05 MG/24HR patch Place 1 patch onto the skin once a week.      . Fluticasone-Salmeterol (ADVAIR DISKUS) 250-50 MCG/DOSE AEPB Inhale 1 puff into the lungs 2 (two) times daily.  60 each  6  . Insulin Aspart Prot & Aspart (NOVOLOG MIX 70/30 FLEXPEN) (70-30) 100 UNIT/ML Pen Inject 5  Units into the skin daily with supper.  15 mL  11  . Insulin Glargine (LANTUS SOLOSTAR) 100 UNIT/ML Solostar Pen Inject 30 Units into the skin every morning.  5 pen  PRN  . levothyroxine (SYNTHROID, LEVOTHROID) 112 MCG tablet Take 1 tablet (112 mcg total) by mouth daily.  30 tablet  7  . losartan (COZAAR) 100 MG tablet Take 1 tablet (100 mg total) by mouth daily.  30 tablet  11  . metoprolol tartrate (LOPRESSOR) 25 MG tablet Take 1 tablet (25 mg total) by mouth 2 (two) times daily.  180 tablet  3  . omeprazole (PRILOSEC) 20 MG capsule Take 1 capsule (20 mg total) by mouth daily.  90 capsule  3  . simvastatin (ZOCOR) 40 MG tablet Take 40 mg by mouth every evening.      Marland Kitchen Spacer/Aero-Holding Josiah Lobo KIT For use with [facility] and [facility] please substitute the appropriate spacer needed  1 each  1  . Tiotropium Bromide Monohydrate (SPIRIVA RESPIMAT) 2.5 MCG/ACT AERS Inhale 2 puffs into the lungs daily.  4 g  5    No current facility-administered medications on file prior to visit.    Allergies  Allergen Reactions  . Chantix [Varenicline] Rash    Family History  Problem Relation Age of Onset  . Cancer Other     Aunt-breast; grandmother- lung and ovarian  . Diabetes Father   . Hypertension Mother   . Hyperlipidemia Mother   . Aneurysm Brother     BP 120/72  Pulse 81  Temp(Src) 98.2 F (36.8 C) (Oral)  Ht $R'5\' 2"'TL$  (1.575 m)  Wt 143 lb (64.864 kg)  BMI 26.15 kg/m2  SpO2 96%  LMP 10/12/2012    Review of Systems She denies hypoglycemia and n/v    Objective:   Physical Exam VITAL SIGNS:  See vs page GENERAL: no distress Pulses: dorsalis pedis intact bilat.   Feet: no deformity.  no edema.  There is bilateral onychomycosis. Skin:  no ulcer on the feet.  normal color and temp. Neuro: sensation is intact to touch on the feet   Lab Results  Component Value Date   HGBA1C 10.4* 02/10/2014      Assessment & Plan:  DM: control is improved.  Noncompliance with cbg recording, persistent: I'll work around this as best I can   Patient is advised the following: Patient Instructions  Please continue the same insulins for now. Please come back for a follow-up appointment in 2 months check your blood sugar 4 times a day: before the 3 meals, and at bedtime.  The bedtime check is really important.  also check if you have symptoms of your blood sugar being too high or too low.  please keep a record of the readings and bring it to your next appointment here.  You can write it on any piece of paper.  please call us sooner if your blood sugar goes below 70, or if you have a lot of readings over 200.

## 2014-04-24 ENCOUNTER — Ambulatory Visit (INDEPENDENT_AMBULATORY_CARE_PROVIDER_SITE_OTHER): Payer: Medicaid Other | Admitting: Internal Medicine

## 2014-04-24 ENCOUNTER — Encounter: Payer: Self-pay | Admitting: Internal Medicine

## 2014-04-24 VITALS — BP 138/60 | HR 62 | Temp 98.2°F | Resp 16 | Ht 62.0 in | Wt 145.5 lb

## 2014-04-24 DIAGNOSIS — M25559 Pain in unspecified hip: Secondary | ICD-10-CM

## 2014-04-24 DIAGNOSIS — N760 Acute vaginitis: Secondary | ICD-10-CM

## 2014-04-24 DIAGNOSIS — Z01818 Encounter for other preprocedural examination: Secondary | ICD-10-CM

## 2014-04-24 DIAGNOSIS — R52 Pain, unspecified: Secondary | ICD-10-CM

## 2014-04-24 DIAGNOSIS — R1030 Lower abdominal pain, unspecified: Secondary | ICD-10-CM

## 2014-04-24 DIAGNOSIS — R109 Unspecified abdominal pain: Secondary | ICD-10-CM

## 2014-04-24 DIAGNOSIS — R102 Pelvic and perineal pain: Secondary | ICD-10-CM | POA: Insufficient documentation

## 2014-04-24 LAB — POCT URINALYSIS DIPSTICK
Bilirubin, UA: NEGATIVE
Blood, UA: NEGATIVE
Glucose, UA: 100
Ketones, UA: NEGATIVE
LEUKOCYTES UA: NEGATIVE
NITRITE UA: NEGATIVE
Protein, UA: NEGATIVE
Spec Grav, UA: <=1.005
UROBILINOGEN UA: 0.2
pH, UA: 6

## 2014-04-24 LAB — URINALYSIS, ROUTINE W REFLEX MICROSCOPIC
Bilirubin Urine: NEGATIVE
HGB URINE DIPSTICK: NEGATIVE
Ketones, ur: NEGATIVE
LEUKOCYTES UA: NEGATIVE
NITRITE: NEGATIVE
RBC / HPF: NONE SEEN (ref 0–?)
Specific Gravity, Urine: <=1.005 — AB (ref 1.000–1.030)
Total Protein, Urine: NEGATIVE
UROBILINOGEN UA: 0.2 (ref 0.0–1.0)
Urine Glucose: 100 — AB
WBC UA: NONE SEEN (ref 0–?)
pH: 6 (ref 5.0–8.0)

## 2014-04-24 MED ORDER — METRONIDAZOLE 500 MG PO TABS: 500.0000 mg | ORAL_TABLET | Freq: Three times a day (TID) | ORAL | Status: DC

## 2014-04-24 MED ORDER — HYDROCODONE-ACETAMINOPHEN 5-325 MG PO TABS: 1.0000 | ORAL_TABLET | Freq: Four times a day (QID) | ORAL | Status: DC | PRN

## 2014-04-24 NOTE — Patient Instructions (Signed)
I am treating your for vaginosis based on your exam with metronidazole  Please do not drink any alcohol while you are taking this  You can use the vicodin for pain but DO NOT LET IT MAKE YOU GET CONSTIPATED  I will call you with the results of the cultures that I took today

## 2014-04-24 NOTE — Progress Notes (Signed)
Pre-visit discussion using our clinic review tool. No additional management support is needed unless otherwise documented below in the visit note.  

## 2014-04-24 NOTE — Progress Notes (Signed)
Patient ID: Marcia [name], female   DOB: [birth year], 43 y.o.   MRN: [id number]   Patient Active Problem List   Diagnosis Date Noted  . Suprapubic pain, acute 04/24/2014  . Type I (juvenile type) diabetes mellitus with neurological manifestations, uncontrolled 02/10/2014  . Cough 02/05/2014  . COPD (chronic obstructive pulmonary disease) 11/27/2013  . Unspecified constipation 11/11/2013  . Medial knee pain 09/18/2013  . Sprain of metacarpophalangeal joint of left hand 09/18/2013  . COPD exacerbation 07/30/2013  . Insomnia secondary to anxiety 07/09/2013  . History of hyperthyroidism 07/01/2013  . Mass of breast, left 07/01/2013  . Preoperative clearance 05/04/2013  . Snoring disorder 12/26/2012  . Right radial fracture 06/18/2012  . Tobacco abuse 05/22/2011  . Tobacco abuse counseling 05/22/2011  . ABNORMAL EKG 09/10/2009    Subjective:  CC:   Chief Complaint  Patient presents with  . Abdominal Pain    hypogastric pain X 3 days lying on prone on abdomen make worse, bending over makes worse.  . Acute Visit    Patient recently treated with Doxycycline for boil to sacrum area.    HPI:   ABRINA [name] is a 43 y.o. female who presents for suprapubic pain for the last 3 days.   Aggravated by bending over  No  Change with BMs .  Aggravated by a full bladder   Has not had intercourse during this period of time,  S/p TAH /USO    She finished a coudse fnished doxy recently for boil on buttock  Yesterday. No diarrhea    Past Medical History  Diagnosis Date  . Vulvar lesion   . Myocardial infarction 1997    At age 25  . Diabetes mellitus   . History of HPV infection   . Heart murmur   . Hay fever     Allergies  . Vaginal cancer   . Thyroid disease   . Hypercholesterolemia   . Hypothyroidism   . Tobacco abuse     Past Surgical History  Procedure Laterality Date  . Ankle surgery  2010  . Tubal ligation  2004  . Tubal ligation  2004, [facility]  . Abdominal  hysterectomy  10/12/12       The following portions of the patient's history were reviewed and updated as appropriate: Allergies, current medications, and problem list.    Review of Systems:   Patient denies headache, fevers, malaise, unintentional weight loss, skin rash, eye pain, sinus congestion and sinus pain, sore throat, dysphagia,  hemoptysis , cough, dyspnea, wheezing, chest pain, palpitations, orthopnea, edema, abdominal pain, nausea, melena, diarrhea, constipation, flank pain, dysuria, hematuria, urinary  Frequency, nocturia, numbness, tingling, seizures,  Focal weakness, Loss of consciousness,  Tremor, insomnia, depression, anxiety, and suicidal ideation.     History   Social History  . Marital Status: Divorced    Spouse Name: N/A    Number of Children: N/A  . Years of Education: N/A   Occupational History  . Not on file.   Social History Main Topics  . Smoking status: Current Every Day Smoker -- 1.00 packs/day for 22 years    Types: Cigarettes  . Smokeless tobacco: Never Used     Comment: Down to .5ppd, trying to quit.  Allergic to chantix and e-cigs 02/05/14  . Alcohol Use: No  . Drug Use: No  . Sexual Activity: Yes   Other Topics Concern  . Not on file   Social History Narrative   No regular exercise.   Lives  with spouse.    Objective:  Filed Vitals:   04/24/14 1155  BP: 138/60  Pulse: 62  Temp: 98.2 F (36.8 C)  Resp: 16     General appearance: alert, cooperative and appears stated age Ears: normal TM's and external ear canals both ears Throat: lips, mucosa, and tongue normal; teeth and gums normal Neck: no adenopathy, no carotid bruit, supple, symmetrical, trachea midline and thyroid not enlarged, symmetric, no tenderness/mass/nodules Back: symmetric, no curvature. ROM normal. No CVA tenderness. Lungs: clear to auscultation bilaterally Heart: regular rate and rhythm, S1, S2 normal, no murmur, click, rub or gallop Abdomen: soft, non-tender;  bowel sounds normal; no masses,  no organomegaly GYN: vaginal vault with thick white fluid  Pulses: 2+ and symmetric Skin: Skin color, texture, turgor normal. No rashes or lesions Lymph nodes: Cervical, supraclavicular, and axillary nodes normal.  Assessment and Plan:  Suprapubic pain, acute Pelvic exam suggestive of vagnosis.  UA was normal .  Will treat empirically with flagyl pending vaginal cultures.  She is s/p TAH/BSO     Updated Medication List Outpatient Encounter Prescriptions as of 04/24/2014  Medication Sig  . albuterol (PROVENTIL HFA;VENTOLIN HFA) 108 (90 BASE) MCG/ACT inhaler Inhale 2 puffs into the lungs every 6 (six) hours as needed for wheezing or shortness of breath.  Marland Kitchen aspirin 81 MG EC tablet TAKE 1 TABLET (81 MG TOTAL) BY MOUTH DAILY.  Marland Kitchen Cholecalciferol (VITAMIN D3) 50000 UNITS CAPS Take 50,000 capsules by mouth once a week.  . citalopram (CELEXA) 10 MG tablet TAKE 1 TABLET BY MOUTH EVERY DAY  . doxycycline (VIBRA-TABS) 100 MG tablet Take 1 tablet (100 mg total) by mouth 2 (two) times daily.  Marland Kitchen estradiol (VIVELLE-DOT) 0.05 MG/24HR patch Place 1 patch onto the skin once a week.  . Fluticasone-Salmeterol (ADVAIR DISKUS) 250-50 MCG/DOSE AEPB Inhale 1 puff into the lungs 2 (two) times daily.  Marland Kitchen HYDROcodone-acetaminophen (NORCO/VICODIN) 5-325 MG per tablet Take 1 tablet by mouth every 6 (six) hours as needed for moderate pain.  . Insulin Aspart Prot & Aspart (NOVOLOG MIX 70/30 FLEXPEN) (70-30) 100 UNIT/ML Pen Inject 5 Units into the skin daily with supper.  . Insulin Glargine (LANTUS SOLOSTAR) 100 UNIT/ML Solostar Pen Inject 30 Units into the skin every morning.  Marland Kitchen levothyroxine (SYNTHROID, LEVOTHROID) 112 MCG tablet Take 1 tablet (112 mcg total) by mouth daily.  Marland Kitchen losartan (COZAAR) 100 MG tablet Take 1 tablet (100 mg total) by mouth daily.  . metoprolol tartrate (LOPRESSOR) 25 MG tablet Take 1 tablet (25 mg total) by mouth 2 (two) times daily.  . metroNIDAZOLE (FLAGYL) 500  MG tablet Take 1 tablet (500 mg total) by mouth 3 (three) times daily.  Marland Kitchen omeprazole (PRILOSEC) 20 MG capsule Take 1 capsule (20 mg total) by mouth daily.  . simvastatin (ZOCOR) 40 MG tablet Take 40 mg by mouth every evening.  Marland Kitchen Spacer/Aero-Holding Josiah Lobo KIT For use with [facility] and [facility] please substitute the appropriate spacer needed  . Tiotropium Bromide Monohydrate (SPIRIVA RESPIMAT) 2.5 MCG/ACT AERS Inhale 2 puffs into the lungs daily.  . [DISCONTINUED] metroNIDAZOLE (FLAGYL) 500 MG tablet Take 1 tablet (500 mg total) by mouth 3 (three) times daily.     Orders Placed This Encounter  Procedures  . Urine Culture  . Culture, routine-genital  . WET PREP FOR [place], YEAST, CLUE  . GC/Chlamydia Probe Amp  . WET PREP BY MOLECULAR PROBE  . Urinalysis, Routine w reflex microscopic  . POCT Urinalysis Dipstick    No  Follow-up on file.

## 2014-04-25 LAB — GC/CHLAMYDIA PROBE AMP
CT Probe RNA: NEGATIVE
GC PROBE AMP APTIMA: NEGATIVE

## 2014-04-25 LAB — WET PREP BY MOLECULAR PROBE
CANDIDA SPECIES: POSITIVE — AB
Gardnerella vaginalis: NEGATIVE
TRICHOMONAS VAG: NEGATIVE

## 2014-04-26 ENCOUNTER — Encounter: Payer: Self-pay | Admitting: Internal Medicine

## 2014-04-26 LAB — URINE CULTURE: Colony Count: 8000

## 2014-04-26 MED ORDER — FLUCONAZOLE 150 MG PO TABS: 150.0000 mg | ORAL_TABLET | Freq: Every day | ORAL | Status: DC

## 2014-04-26 NOTE — Addendum Note (Signed)
Addended by: Crecencio Mc on: 04/26/2014 06:13 PM   Modules accepted: Orders

## 2014-04-26 NOTE — Assessment & Plan Note (Addendum)
Pelvic exam suggestive of vagnosis.  UA was normal .  Will treat empirically with flagyl pending vaginal cultures.  She is s/p TAH/BSO

## 2014-04-27 LAB — CULTURE, ROUTINE-GENITAL: Organism ID, Bacteria: NORMAL

## 2014-05-05 ENCOUNTER — Ambulatory Visit (INDEPENDENT_AMBULATORY_CARE_PROVIDER_SITE_OTHER): Payer: Medicaid Other | Admitting: Internal Medicine

## 2014-05-05 ENCOUNTER — Encounter: Payer: Self-pay | Admitting: Internal Medicine

## 2014-05-05 VITALS — BP 108/76 | HR 81 | Temp 98.5°F | Wt 145.0 lb

## 2014-05-05 DIAGNOSIS — T148 Other injury of unspecified body region: Secondary | ICD-10-CM

## 2014-05-05 DIAGNOSIS — W57XXXA Bitten or stung by nonvenomous insect and other nonvenomous arthropods, initial encounter: Secondary | ICD-10-CM

## 2014-05-05 MED ORDER — HYDROCORTISONE 0.5 % EX CREA: 1.0000 "application " | TOPICAL_CREAM | Freq: Two times a day (BID) | CUTANEOUS | Status: DC

## 2014-05-05 NOTE — Progress Notes (Signed)
Subjective:    Patient ID: Marcia [name], female    DOB: Apr 05, 1971, 43 y.o.   MRN: [id number]  HPI  Pt presents to the clinic today with c/o an insect bite to her left upper arm. She reports this occurred over the weekend. The area is very itchy. She has not noticed any redness or warmth to the area. She has tried Benadryl without relief.  Review of Systems      Past Medical History  Diagnosis Date  . Vulvar lesion   . Myocardial infarction 1997    At age 43  . Diabetes mellitus   . History of HPV infection   . Heart murmur   . Hay fever     Allergies  . Vaginal cancer   . Thyroid disease   . Hypercholesterolemia   . Hypothyroidism   . Tobacco abuse     Current Outpatient Prescriptions  Medication Sig Dispense Refill  . albuterol (PROVENTIL HFA;VENTOLIN HFA) 108 (90 BASE) MCG/ACT inhaler Inhale 2 puffs into the lungs every 6 (six) hours as needed for wheezing or shortness of breath.  1 Inhaler  11  . aspirin 81 MG EC tablet TAKE 1 TABLET (81 MG TOTAL) BY MOUTH DAILY.  30 tablet  11  . Cholecalciferol (VITAMIN D3) 50000 UNITS CAPS Take 50,000 capsules by mouth once a week.  4 capsule  2  . citalopram (CELEXA) 10 MG tablet TAKE 1 TABLET BY MOUTH EVERY DAY  30 tablet  2  . doxycycline (VIBRA-TABS) 100 MG tablet Take 1 tablet (100 mg total) by mouth 2 (two) times daily.  20 tablet  0  . estradiol (VIVELLE-DOT) 0.05 MG/24HR patch Place 1 patch onto the skin once a week.      . fluconazole (DIFLUCAN) 150 MG tablet Take 1 tablet (150 mg total) by mouth daily.  2 tablet  0  . Fluticasone-Salmeterol (ADVAIR DISKUS) 250-50 MCG/DOSE AEPB Inhale 1 puff into the lungs 2 (two) times daily.  60 each  6  . HYDROcodone-acetaminophen (NORCO/VICODIN) 5-325 MG per tablet Take 1 tablet by mouth every 6 (six) hours as needed for moderate pain.  30 tablet  0  . Insulin Aspart Prot & Aspart (NOVOLOG MIX 70/30 FLEXPEN) (70-30) 100 UNIT/ML Pen Inject 5 Units into the skin daily with supper.   15 mL  11  . Insulin Glargine (LANTUS SOLOSTAR) 100 UNIT/ML Solostar Pen Inject 30 Units into the skin every morning.  5 pen  PRN  . levothyroxine (SYNTHROID, LEVOTHROID) 112 MCG tablet Take 1 tablet (112 mcg total) by mouth daily.  30 tablet  7  . losartan (COZAAR) 100 MG tablet Take 1 tablet (100 mg total) by mouth daily.  30 tablet  11  . metoprolol tartrate (LOPRESSOR) 25 MG tablet Take 1 tablet (25 mg total) by mouth 2 (two) times daily.  180 tablet  3  . metroNIDAZOLE (FLAGYL) 500 MG tablet Take 1 tablet (500 mg total) by mouth 3 (three) times daily.  21 tablet  0  . omeprazole (PRILOSEC) 20 MG capsule Take 1 capsule (20 mg total) by mouth daily.  90 capsule  3  . simvastatin (ZOCOR) 40 MG tablet Take 40 mg by mouth every evening.      Marland Kitchen Spacer/Aero-Holding Josiah Lobo KIT For use with [facility] and [facility] please substitute the appropriate spacer needed  1 each  1  . Tiotropium Bromide Monohydrate (SPIRIVA RESPIMAT) 2.5 MCG/ACT AERS Inhale 2 puffs into the lungs daily.  4 g  5  . hydrocortisone cream 0.5 % Apply 1 application topically 2 (two) times daily.  30 g  0   No current facility-administered medications for this visit.    Allergies  Allergen Reactions  . Chantix [Varenicline] Rash    Family History  Problem Relation Age of Onset  . Cancer Other     Aunt-breast; grandmother- lung and ovarian  . Diabetes Father   . Hypertension Mother   . Hyperlipidemia Mother   . Aneurysm Brother     History   Social History  . Marital Status: Divorced    Spouse Name: N/A    Number of Children: N/A  . Years of Education: N/A   Occupational History  . Not on file.   Social History Main Topics  . Smoking status: Current Every Day Smoker -- 1.00 packs/day for 22 years    Types: Cigarettes  . Smokeless tobacco: Never Used     Comment: Down to .5ppd, trying to quit.  Allergic to chantix and e-cigs 02/05/14  . Alcohol Use: No  . Drug Use: No  . Sexual Activity: Yes    Other Topics Concern  . Not on file   Social History Narrative   No regular exercise.   Lives with spouse.     Constitutional: Denies fever, malaise, fatigue, headache or abrupt weight changes.  Skin: Pt reports bug bite to left arm. Denies redness, rashes, lesions or ulcercations.     No other specific complaints in a complete review of systems (except as listed in HPI above).  Objective:   Physical Exam   BP 108/76  Pulse 81  Temp(Src) 98.5 F (36.9 C) (Oral)  Wt 145 lb (65.772 kg)  SpO2 98%  LMP 10/12/2012 Wt Readings from Last 3 Encounters:  05/05/14 145 lb (65.772 kg)  04/24/14 145 lb 8 oz (65.998 kg)  04/15/14 143 lb (64.864 kg)    General: Appears her stated age, well developed, well nourished in NAD. Skin: Warm, dry and intact. Small bite noted on left medial arm, not infected. Cardiovascular: Normal rate and rhythm. S1,S2 noted.  No murmur, rubs or gallops noted.  Pulmonary/Chest: Normal effort and positive vesicular breath sounds. No respiratory distress. No wheezes, rales or ronchi noted.    BMET    Component Value Date/Time   NA 130* 11/11/2013 0924   K 5.5* 11/11/2013 0924   CL 98 11/11/2013 0924   CO2 24 11/11/2013 0924   GLUCOSE 370* 11/11/2013 0924   BUN 16 11/11/2013 0924   CREATININE 0.7 11/11/2013 0924   CREATININE 0.80 11/07/2011 1139   CALCIUM 9.0 11/11/2013 0924   GFRNONAA >89 11/07/2011 1139   GFRAA >89 11/07/2011 1139    Lipid Panel     Component Value Date/Time   CHOL 206* 11/11/2013 0924   TRIG 112.0 11/11/2013 0924   HDL 55.80 11/11/2013 0924   CHOLHDL 4 11/11/2013 0924   VLDL 22.4 11/11/2013 0924   LDLCALC 128* 11/11/2013 0924    CBC    Component Value Date/Time   WBC 13.7* 12/25/2012 0900   RBC 4.95 12/25/2012 0900   HGB 15.3* 12/25/2012 0900   HCT 44.6 12/25/2012 0900   PLT 253.0 12/25/2012 0900   MCV 90.0 12/25/2012 0900   MCHC 34.4 12/25/2012 0900   RDW 14.2 12/25/2012 0900   LYMPHSABS 2.1 12/25/2012 0900   MONOABS 1.0 12/25/2012 0900   EOSABS 0.1  12/25/2012 0900   BASOSABS 0.0 12/25/2012 0900    Hgb A1C Lab Results  Component Value  Date   HGBA1C 10.4* 02/10/2014        Assessment & Plan:   Bug bite of left arm:  RX provided for hydrocortisone cream OK to continue benadryl Watch for increased redness or warmth around the area  RTC as needed

## 2014-05-05 NOTE — Patient Instructions (Addendum)

## 2014-05-05 NOTE — Progress Notes (Signed)
Pre visit review using our clinic review tool, if applicable. No additional management support is needed unless otherwise documented below in the visit note. 

## 2014-05-06 ENCOUNTER — Telehealth: Payer: Self-pay | Admitting: Internal Medicine

## 2014-05-06 NOTE — Telephone Encounter (Signed)
emmi emailed °

## 2014-05-13 ENCOUNTER — Ambulatory Visit: Payer: Medicaid Other | Admitting: Endocrinology

## 2014-05-26 ENCOUNTER — Other Ambulatory Visit: Payer: Self-pay | Admitting: Endocrinology

## 2014-05-26 ENCOUNTER — Telehealth: Payer: Self-pay | Admitting: Endocrinology

## 2014-05-26 MED ORDER — ACCU-CHEK AVIVA PLUS W/DEVICE KIT: 1.0000 | PACK | Freq: Once | Status: AC

## 2014-05-26 MED ORDER — GLUCOSE BLOOD VI STRP: 1.0000 | ORAL_STRIP | Freq: Two times a day (BID) | Status: DC

## 2014-05-26 NOTE — Telephone Encounter (Signed)
please call patient: i received prior auth for your strips. Because you no longer have the pump, you should change to the aviva. i have sent a prescription to your pharmacy, for the pump, and strips. i'll see you next time.

## 2014-05-26 NOTE — Telephone Encounter (Signed)
Pt advised of note below 

## 2014-05-27 ENCOUNTER — Other Ambulatory Visit: Payer: Self-pay | Admitting: *Deleted

## 2014-05-27 ENCOUNTER — Other Ambulatory Visit: Payer: Self-pay | Admitting: Internal Medicine

## 2014-05-29 ENCOUNTER — Other Ambulatory Visit: Payer: Self-pay | Admitting: *Deleted

## 2014-05-29 DIAGNOSIS — E039 Hypothyroidism, unspecified: Secondary | ICD-10-CM

## 2014-05-29 MED ORDER — LEVOTHYROXINE SODIUM 112 MCG PO TABS: 112.0000 ug | ORAL_TABLET | Freq: Every day | ORAL | Status: DC

## 2014-06-12 ENCOUNTER — Encounter: Payer: Self-pay | Admitting: Pulmonary Disease

## 2014-06-15 ENCOUNTER — Encounter: Payer: Self-pay | Admitting: Endocrinology

## 2014-06-15 ENCOUNTER — Ambulatory Visit (INDEPENDENT_AMBULATORY_CARE_PROVIDER_SITE_OTHER): Payer: Medicaid Other | Admitting: Endocrinology

## 2014-06-15 VITALS — BP 126/80 | HR 85 | Temp 98.5°F | Ht 62.0 in | Wt 149.0 lb

## 2014-06-15 DIAGNOSIS — E1065 Type 1 diabetes mellitus with hyperglycemia: Principal | ICD-10-CM

## 2014-06-15 DIAGNOSIS — E1049 Type 1 diabetes mellitus with other diabetic neurological complication: Secondary | ICD-10-CM

## 2014-06-15 DIAGNOSIS — IMO0002 Reserved for concepts with insufficient information to code with codable children: Secondary | ICD-10-CM

## 2014-06-15 DIAGNOSIS — E1041 Type 1 diabetes mellitus with diabetic mononeuropathy: Secondary | ICD-10-CM

## 2014-06-15 LAB — MICROALBUMIN / CREATININE URINE RATIO
Creatinine,U: 37 mg/dL
Microalb Creat Ratio: 0.3 mg/g (ref 0.0–30.0)
Microalb, Ur: 0.1 mg/dL (ref 0.0–1.9)

## 2014-06-15 LAB — HEMOGLOBIN A1C: Hgb A1c MFr Bld: 10.5 % — ABNORMAL HIGH (ref 4.6–6.5)

## 2014-06-15 MED ORDER — INSULIN ASPART 100 UNIT/ML FLEXPEN: 5.0000 [IU] | PEN_INJECTOR | Freq: Every day | SUBCUTANEOUS | Status: DC

## 2014-06-15 NOTE — Progress Notes (Signed)
Subjective:    Patient ID: Marcia [name], female    DOB: May 03, 1971, 43 y.o.   MRN: [id number]  HPI  Pt returns for f/u of diabetes mellitus: DM type: 1 Dx'ed: 3748 Complications: polyneuropathy and retinopathy Therapy: insulin since GDM: never DKA: only at dx Severe hypoglycemia: last episode was 2004 Pancreatitis: never Other: she stopped pump rx in mid-2015, due to poor results; she then had poor results with bid premixed insulin; she was changed to qd lantus and qd humalog.   Interval history: she brings a record of her cbg's which i have reviewed today.  It varies from 55-300.  It is in general higher as the day goes on.  pt states she feels well in general. Past Medical History  Diagnosis Date  . Vulvar lesion   . Myocardial infarction 1997    At age 13  . Diabetes mellitus   . History of HPV infection   . Heart murmur   . Hay fever     Allergies  . Vaginal cancer   . Thyroid disease   . Hypercholesterolemia   . Hypothyroidism   . Tobacco abuse     Past Surgical History  Procedure Laterality Date  . Ankle surgery  2010  . Tubal ligation  2004  . Tubal ligation  2004, [facility]  . Abdominal hysterectomy  10/12/12    History   Social History  . Marital Status: Divorced    Spouse Name: N/A    Number of Children: N/A  . Years of Education: N/A   Occupational History  . Not on file.   Social History Main Topics  . Smoking status: Current Every Day Smoker -- 1.00 packs/day for 22 years    Types: Cigarettes  . Smokeless tobacco: Never Used     Comment: Down to .5ppd, trying to quit.  Allergic to chantix and e-cigs 02/05/14  . Alcohol Use: No  . Drug Use: No  . Sexual Activity: Yes   Other Topics Concern  . Not on file   Social History Narrative   No regular exercise.   Lives with spouse.    Current Outpatient Prescriptions on File Prior to Visit  Medication Sig Dispense Refill  . albuterol (PROVENTIL HFA;VENTOLIN HFA) 108 (90 BASE) MCG/ACT inhaler  Inhale 2 puffs into the lungs every 6 (six) hours as needed for wheezing or shortness of breath. 1 Inhaler 11  . aspirin 81 MG EC tablet TAKE 1 TABLET (81 MG TOTAL) BY MOUTH DAILY. 30 tablet 11  . Blood Glucose Monitoring Suppl (ACCU-CHEK AVIVA PLUS) W/DEVICE KIT 1 Device by Does not apply route once. 1 kit 0  . Cholecalciferol (VITAMIN D3) 50000 UNITS CAPS Take 50,000 capsules by mouth once a week. 4 capsule 2  . citalopram (CELEXA) 10 MG tablet TAKE 1 TABLET BY MOUTH EVERY DAY 30 tablet 2  . doxycycline (VIBRA-TABS) 100 MG tablet Take 1 tablet (100 mg total) by mouth 2 (two) times daily. 20 tablet 0  . estradiol (VIVELLE-DOT) 0.05 MG/24HR patch Place 1 patch onto the skin once a week.    . fluconazole (DIFLUCAN) 150 MG tablet Take 1 tablet (150 mg total) by mouth daily. 2 tablet 0  . Fluticasone-Salmeterol (ADVAIR DISKUS) 250-50 MCG/DOSE AEPB Inhale 1 puff into the lungs 2 (two) times daily. 60 each 6  . glucose blood (ACCU-CHEK AVIVA) test strip 1 each by Other route 2 (two) times daily. And lancets 2/day 100 each 12  . HYDROcodone-acetaminophen (NORCO/VICODIN) 5-325 MG per tablet  Take 1 tablet by mouth every 6 (six) hours as needed for moderate pain. 30 tablet 0  . hydrocortisone cream 0.5 % Apply 1 application topically 2 (two) times daily. 30 g 0  . Insulin Glargine (LANTUS SOLOSTAR) 100 UNIT/ML Solostar Pen Inject 30 Units into the skin every morning. 5 pen PRN  . levothyroxine (SYNTHROID, LEVOTHROID) 112 MCG tablet Take 1 tablet (112 mcg total) by mouth daily. 30 tablet 1  . losartan (COZAAR) 100 MG tablet Take 1 tablet (100 mg total) by mouth daily. 30 tablet 11  . metoprolol tartrate (LOPRESSOR) 25 MG tablet Take 1 tablet (25 mg total) by mouth 2 (two) times daily. 180 tablet 3  . metroNIDAZOLE (FLAGYL) 500 MG tablet Take 1 tablet (500 mg total) by mouth 3 (three) times daily. 21 tablet 0  . omeprazole (PRILOSEC) 20 MG capsule Take 1 capsule (20 mg total) by mouth daily. 90 capsule 3  .  simvastatin (ZOCOR) 40 MG tablet Take 40 mg by mouth every evening.    . Spacer/Aero-Holding Chambers KIT For use with [facility] and [facility] please substitute the appropriate spacer needed 1 each 1  . Tiotropium Bromide Monohydrate (SPIRIVA RESPIMAT) 2.5 MCG/ACT AERS Inhale 2 puffs into the lungs daily. 4 g 5   No current facility-administered medications on file prior to visit.    Allergies  Allergen Reactions  . Chantix [Varenicline] Rash    Family History  Problem Relation Age of Onset  . Cancer Other     Aunt-breast; grandmother- lung and ovarian  . Diabetes Father   . Hypertension Mother   . Hyperlipidemia Mother   . Aneurysm Brother     BP 126/80 mmHg  Pulse 85  Temp(Src) 98.5 F (36.9 C) (Oral)  Ht 5' 2" (1.575 m)  Wt 149 lb (67.586 kg)  BMI 27.25 kg/m2  SpO2 97%  LMP 10/12/2012    Review of Systems Denies LOC and n/v.      Objective:   Physical Exam VITAL SIGNS:  See vs page GENERAL: no distress Pulses: dorsalis pedis intact bilat.   Feet: no deformity.  no edema.  There is bilateral onychomycosis.   Skin:  no ulcer on the feet.  normal color and temp. Neuro: sensation is intact to touch on the feet, but decreased from normal     Assessment & Plan:  DM: apparently well-controlled   Patient is advised the following: Patient Instructions  Please continue the same lantus, and: Change the novolog 70/30 to just novolog, 5 units with the evening meal.   Please come back for a follow-up appointment in 3 months.  check your blood sugar 4 times a day: before the 3 meals, and at bedtime.  The bedtime check is really important.  also check if you have symptoms of your blood sugar being too high or too low.  please keep a record of the readings and bring it to your next appointment here.  You can write it on any piece of paper.  please call us sooner if your blood sugar goes below 70, or if you have a lot of readings over 200.   blood and urine tests  are being requested for you today.  We'll contact you with results.     

## 2014-06-15 NOTE — Patient Instructions (Addendum)
Please continue the same lantus, and: Change the novolog 70/30 to just novolog, 5 units with the evening meal.   Please come back for a follow-up appointment in 3 months.  check your blood sugar 4 times a day: before the 3 meals, and at bedtime.  The bedtime check is really important.  also check if you have symptoms of your blood sugar being too high or too low.  please keep a record of the readings and bring it to your next appointment here.  You can write it on any piece of paper.  please call us sooner if your blood sugar goes below 70, or if you have a lot of readings over 200.   blood and urine tests are being requested for you today.  We'll contact you with results.

## 2014-06-25 ENCOUNTER — Other Ambulatory Visit: Payer: Self-pay | Admitting: Internal Medicine

## 2014-07-20 ENCOUNTER — Ambulatory Visit (INDEPENDENT_AMBULATORY_CARE_PROVIDER_SITE_OTHER): Payer: Medicaid Other | Admitting: Pulmonary Disease

## 2014-07-20 DIAGNOSIS — R05 Cough: Secondary | ICD-10-CM

## 2014-07-20 NOTE — Progress Notes (Signed)
No show

## 2014-07-21 ENCOUNTER — Encounter: Payer: Self-pay | Admitting: *Deleted

## 2014-07-23 ENCOUNTER — Encounter: Payer: Self-pay | Admitting: *Deleted

## 2014-08-03 ENCOUNTER — Other Ambulatory Visit: Payer: Self-pay | Admitting: Internal Medicine

## 2014-08-03 DIAGNOSIS — E034 Atrophy of thyroid (acquired): Secondary | ICD-10-CM

## 2014-08-03 DIAGNOSIS — IMO0002 Reserved for concepts with insufficient information to code with codable children: Secondary | ICD-10-CM

## 2014-08-03 DIAGNOSIS — E1049 Type 1 diabetes mellitus with other diabetic neurological complication: Secondary | ICD-10-CM

## 2014-08-03 DIAGNOSIS — E1065 Type 1 diabetes mellitus with hyperglycemia: Secondary | ICD-10-CM

## 2014-08-04 NOTE — Telephone Encounter (Signed)
Last TSH was 06/2013  And last OV  11/15 No Tsh X 1 Year.

## 2014-08-04 NOTE — Telephone Encounter (Signed)
Refill one 30 days only.  Has not  Had thyroid checked in over a year  so needs fasting labs prior to any more refills

## 2014-08-06 ENCOUNTER — Encounter: Payer: Medicaid Other | Admitting: Internal Medicine

## 2014-09-01 ENCOUNTER — Other Ambulatory Visit: Payer: Self-pay | Admitting: Internal Medicine

## 2014-09-10 ENCOUNTER — Ambulatory Visit (INDEPENDENT_AMBULATORY_CARE_PROVIDER_SITE_OTHER): Payer: Medicaid Other | Admitting: Internal Medicine

## 2014-09-10 ENCOUNTER — Encounter: Payer: Self-pay | Admitting: Internal Medicine

## 2014-09-10 VITALS — BP 110/70 | HR 82 | Temp 98.5°F | Resp 16 | Ht 63.0 in | Wt 148.2 lb

## 2014-09-10 DIAGNOSIS — Z0000 Encounter for general adult medical examination without abnormal findings: Secondary | ICD-10-CM

## 2014-09-10 DIAGNOSIS — Z8639 Personal history of other endocrine, nutritional and metabolic disease: Secondary | ICD-10-CM

## 2014-09-10 DIAGNOSIS — E1069 Type 1 diabetes mellitus with other specified complication: Secondary | ICD-10-CM

## 2014-09-10 DIAGNOSIS — E1049 Type 1 diabetes mellitus with other diabetic neurological complication: Secondary | ICD-10-CM

## 2014-09-10 DIAGNOSIS — E038 Other specified hypothyroidism: Secondary | ICD-10-CM

## 2014-09-10 DIAGNOSIS — E1065 Type 1 diabetes mellitus with hyperglycemia: Secondary | ICD-10-CM

## 2014-09-10 DIAGNOSIS — Z716 Tobacco abuse counseling: Secondary | ICD-10-CM

## 2014-09-10 DIAGNOSIS — B351 Tinea unguium: Secondary | ICD-10-CM | POA: Insufficient documentation

## 2014-09-10 DIAGNOSIS — E1041 Type 1 diabetes mellitus with diabetic mononeuropathy: Secondary | ICD-10-CM

## 2014-09-10 DIAGNOSIS — J410 Simple chronic bronchitis: Secondary | ICD-10-CM

## 2014-09-10 DIAGNOSIS — Z1239 Encounter for other screening for malignant neoplasm of breast: Secondary | ICD-10-CM

## 2014-09-10 DIAGNOSIS — E875 Hyperkalemia: Secondary | ICD-10-CM

## 2014-09-10 DIAGNOSIS — F5105 Insomnia due to other mental disorder: Secondary | ICD-10-CM

## 2014-09-10 DIAGNOSIS — E034 Atrophy of thyroid (acquired): Secondary | ICD-10-CM

## 2014-09-10 DIAGNOSIS — F419 Anxiety disorder, unspecified: Secondary | ICD-10-CM

## 2014-09-10 DIAGNOSIS — IMO0002 Reserved for concepts with insufficient information to code with codable children: Secondary | ICD-10-CM

## 2014-09-10 DIAGNOSIS — E785 Hyperlipidemia, unspecified: Secondary | ICD-10-CM

## 2014-09-10 LAB — COMPREHENSIVE METABOLIC PANEL
ALT: 18 U/L (ref 0–35)
AST: 20 U/L (ref 0–37)
Albumin: 4.1 g/dL (ref 3.5–5.2)
Alkaline Phosphatase: 75 U/L (ref 39–117)
BUN: 19 mg/dL (ref 6–23)
CO2: 18 mEq/L — ABNORMAL LOW (ref 19–32)
Calcium: 9.1 mg/dL (ref 8.4–10.5)
Chloride: 103 mEq/L (ref 96–112)
Creatinine, Ser: 0.89 mg/dL (ref 0.40–1.20)
GFR: 73.33 mL/min (ref >60.00)
Glucose, Bld: 312 mg/dL — ABNORMAL HIGH (ref 70–99)
Potassium: 4.2 mEq/L (ref 3.5–5.1)
SODIUM: 133 meq/L — AB (ref 135–145)
Total Bilirubin: 0.8 mg/dL (ref 0.2–1.2)
Total Protein: 7.2 g/dL (ref 6.0–8.3)

## 2014-09-10 LAB — TSH: TSH: 1.87 u[IU]/mL (ref 0.35–4.50)

## 2014-09-10 LAB — BASIC METABOLIC PANEL
BUN: 19 mg/dL (ref 6–23)
CHLORIDE: 103 meq/L (ref 96–112)
CO2: 18 meq/L — AB (ref 19–32)
Calcium: 9.1 mg/dL (ref 8.4–10.5)
Creatinine, Ser: 0.89 mg/dL (ref 0.40–1.20)
GFR: 73.33 mL/min (ref >60.00)
Glucose, Bld: 312 mg/dL — ABNORMAL HIGH (ref 70–99)
POTASSIUM: 4.2 meq/L (ref 3.5–5.1)
SODIUM: 133 meq/L — AB (ref 135–145)

## 2014-09-10 LAB — LIPID PANEL
CHOLESTEROL: 212 mg/dL — AB (ref 0–200)
HDL: 61.1 mg/dL (ref >39.00)
LDL Cholesterol: 122 mg/dL — ABNORMAL HIGH (ref 0–99)
NonHDL: 150.9
Total CHOL/HDL Ratio: 3
Triglycerides: 147 mg/dL (ref 0.0–149.0)
VLDL: 29.4 mg/dL (ref 0.0–40.0)

## 2014-09-10 NOTE — Assessment & Plan Note (Addendum)
Spent 3 minutes discussing risk of continued tobacco abuse, including but not limited to CAD, PAD, hypertension, and CA.  sHe is not interested in pharmacotherapy at this time.  I have encouraged her to continue reducing her consumption

## 2014-09-10 NOTE — Patient Instructions (Signed)

## 2014-09-10 NOTE — Progress Notes (Signed)
Patient ID: Marcia [name], female   DOB: 1970-11-12, 44 y.o.   MRN: [id number]   Subjective:     Marcia [name] is a 44 y.o. female and is here for a comprehensive physical exam. The patient reports no problems.    She has a history of vaginal cancer,  And is  S/p hysterectomy unilateral oophorectomy  with surveillance done by local GYN.  Her last vaginal exams done 6 months ago by Barnett Applebaum.  2) Type 1 DM.:  She feels much better since Dr Loanne Drilling d/c'd her insulin pump ,  Due to recurrent hypoglyemic events. No new issues,  Foot exam done,  Still smoking but cutting back her daily use to < 1/2 pack daily,     History   Social History  . Marital Status: Divorced    Spouse Name: N/A    Number of Children: N/A  . Years of Education: N/A   Occupational History  . Not on file.   Social History Main Topics  . Smoking status: Current Every Day Smoker -- 1.00 packs/day for 22 years    Types: Cigarettes  . Smokeless tobacco: Never Used     Comment: Down to .5ppd, trying to quit.  Allergic to chantix and e-cigs 02/05/14  . Alcohol Use: No  . Drug Use: No  . Sexual Activity: Yes   Other Topics Concern  . Not on file   Social History Narrative   No regular exercise.   Lives with spouse.   Health Maintenance  Topic Date Due  . PAP SMEAR  02/08/2013  . OPHTHALMOLOGY EXAM  10/09/2014  . HEMOGLOBIN A1C  12/14/2014  . INFLUENZA VACCINE  03/08/2015  . FOOT EXAM  04/16/2015  . URINE MICROALBUMIN  06/16/2015  . TETANUS/TDAP  10/31/2018  . PNEUMOCOCCAL POLYSACCHARIDE VACCINE (2) 02/11/2019    The following portions of the patient's history were reviewed and updated as appropriate: allergies, current medications, past family history, past medical history, past social history, past surgical history and problem list.  Review of Systems  Patient denies headache, fevers, malaise, unintentional weight loss, skin rash, eye pain, sinus congestion and sinus pain, sore throat,  dysphagia,  hemoptysis , cough, dyspnea, wheezing, chest pain, palpitations, orthopnea, edema, abdominal pain, nausea, melena, diarrhea, constipation, flank pain, dysuria, hematuria, urinary  Frequency, nocturia, numbness, tingling, seizures,  Focal weakness, Loss of consciousness,  Tremor, insomnia, depression, anxiety, and suicidal ideation.     Objective:   BP 110/70 mmHg  Pulse 82  Temp(Src) 98.5 F (36.9 C) (Oral)  Resp 16  Ht 5\' 3"  (1.6 m)  Wt 148 lb 4 oz (67.246 kg)  BMI 26.27 kg/m2  SpO2 97%  LMP 10/12/2012  General appearance: alert, cooperative and appears stated age Head: Normocephalic, without obvious abnormality, atraumatic Eyes: conjunctivae/corneas clear. PERRL, EOM's intact. Fundi benign. Ears: normal TM's and external ear canals both ears Nose: Nares normal. Septum midline. Mucosa normal. No drainage or sinus tenderness. Throat: lips, mucosa, and tongue normal; teeth and gums normal Neck: no adenopathy, no carotid bruit, no JVD, supple, symmetrical, trachea midline and thyroid not enlarged, symmetric, no tenderness/mass/nodules Lungs: clear to auscultation bilaterally Breasts: normal appearance, no masses or tenderness Heart: regular rate and rhythm, S1, S2 normal, no murmur, click, rub or gallop Abdomen: soft, non-tender; bowel sounds normal; no masses,  no organomegaly Extremities: extremities normal, atraumatic, no cyanosis or edema Pulses: 2+ and symmetric Skin: Several tattoes on chest and arms.  Skin color, texture, turgor normal. No rashes or  lesions Neurologic: Alert and oriented X 3, normal strength and tone. Normal symmetric reflexes. Normal coordination and gait.  Foot exam:  Nails are well trimmed,  No callouses,  Sensation intact to microfilament      Assessment and Plan:   Problem List Items Addressed This Visit    Chronic bronchitis    She was referrred to Dr. Lake Bells folllowing formal assessment with PFTS.  She does not meet criteria for CODPD.   Continue to encourage ad advise tobacco cessation       Encounter for preventive health examination    Annual wellness  exam was done as well as a comprehensive physical exam and management of acute and chronic conditions .  During the course of the visit the patient was educated and counseled about appropriate screening and preventive services including :  diabetes screening, lipid analysis with projected  10 year  risk for CAD , nutrition counseling, colorectal cancer screening, and recommended immunizations.  Printed recommendations for health maintenance screenings was given.        History of hyperthyroidism    Thyroid function is WNL on current dose.  No current changes needed.   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 .  Her 10 yr risk of CAD is 9%.  New ACC guidelines recommend starting patients aged 35 or higher on moderate intensity statin therapy for diabetes and concurrent LDL between 70-189.    Lab Results  Component Value Date   CHOL 212* 09/10/2014   HDL 61.10 09/10/2014   LDLCALC 122* 09/10/2014   LDLDIRECT 123.4 12/25/2012   TRIG 147.0 09/10/2014   CHOLHDL 3 09/10/2014         Insomnia secondary to anxiety    Managed with prn alprazolam and daily citalopram for GAD. The risks and benefits of benzodiazepine use were reviewed with patient today including excessive sedation leading to respiratory depression,  impaired thinking/driving, and addiction.  Patient was advised to avoid concurrent use with alcohol, to use medication only as needed and not to share with others  .       Onychomycosis of toenail    Affecting both great toenails.   With dystrophic nail changes. Recommended nonpharmacologic management trial given concurrent use of statin therapy       Tobacco abuse counseling     Spent 3 minutes discussing risk of continued tobacco abuse, including  but not limited to CAD, PAD, hypertension, and CA.  sHe is not interested in pharmacotherapy at this time.  I have encouraged her to continue reducing her consumption      Type 1 diabetes mellitus with neurological manifestations, uncontrolled    Managed by Endicrinology Dr Loanne Drilling .  Hypoglycemic events have stopped with d/c of insulin pump, but she remains uncontrolled. Follow up with Loanne Drilling as directed. Lab Results  Component Value Date   HGBA1C 10.5* 06/15/2014   Lab Results  Component Value Date   MICROALBUR 0.1 06/15/2014          Other Visit Diagnoses    Breast cancer screening    -  Primary    Relevant Orders    MM DIGITAL SCREENING BILATERAL    Hypothyroidism due to acquired atrophy of thyroid        Hyperkalemia

## 2014-09-10 NOTE — Progress Notes (Signed)
Pre-visit discussion using our clinic review tool. No additional management support is needed unless otherwise documented below in the visit note.  

## 2014-09-12 DIAGNOSIS — Z0000 Encounter for general adult medical examination without abnormal findings: Secondary | ICD-10-CM | POA: Insufficient documentation

## 2014-09-12 DIAGNOSIS — E1069 Type 1 diabetes mellitus with other specified complication: Secondary | ICD-10-CM | POA: Insufficient documentation

## 2014-09-12 DIAGNOSIS — E785 Hyperlipidemia, unspecified: Secondary | ICD-10-CM

## 2014-09-12 NOTE — Assessment & Plan Note (Signed)
Thyroid function is WNL on current dose.  No current changes needed.   Lab Results  Component Value Date   TSH 1.87 09/10/2014

## 2014-09-12 NOTE — Assessment & Plan Note (Addendum)
Managed with simvastatin 40 mg . .  Her 10 yr risk of CAD is 9%.  New ACC guidelines recommend starting patients aged 44 or higher on moderate intensity statin therapy for diabetes and concurrent LDL between 70-189.    Lab Results  Component Value Date   CHOL 212* 09/10/2014   HDL 61.10 09/10/2014   LDLCALC 122* 09/10/2014   LDLDIRECT 123.4 12/25/2012   TRIG 147.0 09/10/2014   CHOLHDL 3 09/10/2014

## 2014-09-12 NOTE — Assessment & Plan Note (Signed)
She was referrred to Dr. Lake Bells folllowing formal assessment with PFTS.  She does not meet criteria for CODPD.  Continue to encourage ad advise tobacco cessation

## 2014-09-12 NOTE — Assessment & Plan Note (Signed)
Affecting both great toenails.   With dystrophic nail changes. Recommended nonpharmacologic management trial given concurrent use of statin therapy

## 2014-09-12 NOTE — Assessment & Plan Note (Addendum)
Managed with prn alprazolam and daily citalopram for GAD. The risks and benefits of benzodiazepine use were reviewed with patient today including excessive sedation leading to respiratory depression,  impaired thinking/driving, and addiction.  Patient was advised to avoid concurrent use with alcohol, to use medication only as needed and not to share with others  .

## 2014-09-12 NOTE — Assessment & Plan Note (Signed)

## 2014-09-12 NOTE — Assessment & Plan Note (Signed)
Managed by Endicrinology Dr Loanne Drilling .  Hypoglycemic events have stopped with d/c of insulin pump, but she remains uncontrolled. Follow up with Loanne Drilling as directed. Lab Results  Component Value Date   HGBA1C 10.5* 06/15/2014   Lab Results  Component Value Date   MICROALBUR 0.1 06/15/2014

## 2014-09-15 ENCOUNTER — Encounter: Payer: Self-pay | Admitting: Endocrinology

## 2014-09-15 ENCOUNTER — Ambulatory Visit (INDEPENDENT_AMBULATORY_CARE_PROVIDER_SITE_OTHER): Payer: Medicaid Other | Admitting: Endocrinology

## 2014-09-15 VITALS — BP 128/78 | HR 93 | Temp 98.3°F | Ht 63.0 in | Wt 148.0 lb

## 2014-09-15 DIAGNOSIS — E1049 Type 1 diabetes mellitus with other diabetic neurological complication: Secondary | ICD-10-CM

## 2014-09-15 DIAGNOSIS — E1041 Type 1 diabetes mellitus with diabetic mononeuropathy: Secondary | ICD-10-CM

## 2014-09-15 DIAGNOSIS — IMO0002 Reserved for concepts with insufficient information to code with codable children: Secondary | ICD-10-CM

## 2014-09-15 DIAGNOSIS — E1065 Type 1 diabetes mellitus with hyperglycemia: Principal | ICD-10-CM

## 2014-09-15 LAB — HEMOGLOBIN A1C: Hgb A1c MFr Bld: 10.3 % — ABNORMAL HIGH (ref 4.6–6.5)

## 2014-09-15 MED ORDER — INSULIN GLARGINE 100 UNIT/ML SOLOSTAR PEN: 27.0000 [IU] | PEN_INJECTOR | SUBCUTANEOUS | Status: DC

## 2014-09-15 MED ORDER — INSULIN ASPART 100 UNIT/ML FLEXPEN
PEN_INJECTOR | SUBCUTANEOUS | Status: DC
Start: 2014-09-15 — End: 2014-12-14

## 2014-09-15 NOTE — Patient Instructions (Addendum)
Please reduce the lantus to 27 units each morning, and: Change the novolog to 3 untis with breakfast, and 5 units with the evening meal.   Please come back for a follow-up appointment in 3 months.  check your blood sugar 4 times a day: before the 3 meals, and at bedtime.  The bedtime check is really important.  also check if you have symptoms of your blood sugar being too high or too low.  please keep a record of the readings and bring it to your next appointment here.  You can write it on any piece of paper.  please call us sooner if your blood sugar goes below 70, or if you have a lot of readings over 200.   blood tests are requested for you today.  We'll contact you with results.

## 2014-09-15 NOTE — Progress Notes (Signed)
Subjective:    Patient ID: Marcia [name], female    DOB: 01-17-1971, 44 y.o.   MRN: [id number]  HPI Pt returns for f/u of diabetes mellitus: DM type: 1 Dx'ed: 6734 Complications: polyneuropathy and retinopathy.  Therapy: insulin since dx.  GDM: never DKA: only at dx Severe hypoglycemia: last episode was 2004.  Pancreatitis: never Other: she stopped pump rx in mid-2015, due to poor results; she then had poor results with bid premixed insulin; she was changed to qd lantus and qd humalog.   Interval history: no cbg record, but states cbg's vary from 80-250.  It is in general higher as the day goes on.  Past Medical History  Diagnosis Date  . Vulvar lesion   . Myocardial infarction 1997    At age 89  . Diabetes mellitus   . History of HPV infection   . Heart murmur   . Hay fever     Allergies  . Vaginal cancer   . Thyroid disease   . Hypercholesterolemia   . Hypothyroidism   . Tobacco abuse     Past Surgical History  Procedure Laterality Date  . Ankle surgery  2010  . Tubal ligation  2004  . Tubal ligation  2004, [facility]  . Abdominal hysterectomy  10/12/12    History   Social History  . Marital Status: Divorced    Spouse Name: N/A  . Number of Children: N/A  . Years of Education: N/A   Occupational History  . Not on file.   Social History Main Topics  . Smoking status: Current Every Day Smoker -- 1.00 packs/day for 22 years    Types: Cigarettes  . Smokeless tobacco: Never Used     Comment: Down to .5ppd, trying to quit.  Allergic to chantix and e-cigs 02/05/14  . Alcohol Use: No  . Drug Use: No  . Sexual Activity: Yes   Other Topics Concern  . Not on file   Social History Narrative   No regular exercise.   Lives with spouse.    Current Outpatient Prescriptions on File Prior to Visit  Medication Sig Dispense Refill  . albuterol (PROVENTIL HFA;VENTOLIN HFA) 108 (90 BASE) MCG/ACT inhaler Inhale 2 puffs into the lungs every 6 (six) hours as needed for  wheezing or shortness of breath. 1 Inhaler 11  . aspirin 81 MG EC tablet TAKE 1 TABLET (81 MG TOTAL) BY MOUTH DAILY. 30 tablet 11  . Blood Glucose Monitoring Suppl (ACCU-CHEK AVIVA PLUS) W/DEVICE KIT 1 Device by Does not apply route once. 1 kit 0  . Cholecalciferol (VITAMIN D3) 50000 UNITS CAPS Take 50,000 capsules by mouth once a week. 4 capsule 2  . citalopram (CELEXA) 10 MG tablet TAKE 1 TABLET BY MOUTH EVERY DAY 30 tablet 2  . doxycycline (VIBRA-TABS) 100 MG tablet Take 1 tablet (100 mg total) by mouth 2 (two) times daily. 20 tablet 0  . estradiol (VIVELLE-DOT) 0.05 MG/24HR patch Place 1 patch onto the skin once a week.    . fluconazole (DIFLUCAN) 150 MG tablet Take 1 tablet (150 mg total) by mouth daily. 2 tablet 0  . Fluticasone-Salmeterol (ADVAIR DISKUS) 250-50 MCG/DOSE AEPB Inhale 1 puff into the lungs 2 (two) times daily. 60 each 6  . glucose blood (ACCU-CHEK AVIVA) test strip 1 each by Other route 2 (two) times daily. And lancets 2/day 100 each 12  . hydrocortisone cream 0.5 % Apply 1 application topically 2 (two) times daily. 30 g 0  . levothyroxine (SYNTHROID,  LEVOTHROID) 112 MCG tablet TAKE 1 TABLET (112 MCG TOTAL) BY MOUTH DAILY. 30 tablet 0  . levothyroxine (SYNTHROID, LEVOTHROID) 112 MCG tablet TAKE 1 TABLET (112 MCG TOTAL) BY MOUTH DAILY. 30 tablet 1  . losartan (COZAAR) 100 MG tablet Take 1 tablet (100 mg total) by mouth daily. 30 tablet 11  . metoprolol tartrate (LOPRESSOR) 25 MG tablet Take 1 tablet (25 mg total) by mouth 2 (two) times daily. 180 tablet 3  . omeprazole (PRILOSEC) 20 MG capsule Take 1 capsule (20 mg total) by mouth daily. 90 capsule 3  . simvastatin (ZOCOR) 40 MG tablet Take 40 mg by mouth every evening.    Marland Kitchen Spacer/Aero-Holding Josiah Lobo KIT For use with [facility] and [facility] air [facility] please substitute the appropriate spacer needed 1 each 1  . SPIRIVA RESPIMAT 2.5 MCG/ACT AERS INHALE 2 PUFFS INTO THE LUNGS DAILY. 4 g 5   No current facility-administered  medications on file prior to visit.    Allergies  Allergen Reactions  . Chantix [Varenicline] Rash    Family History  Problem Relation Age of Onset  . Cancer Other     Aunt-breast; grandmother- lung and ovarian  . Diabetes Father   . Hypertension Mother   . Hyperlipidemia Mother   . Aneurysm Brother     BP 128/78 mmHg  Pulse 93  Temp(Src) 98.3 F (36.8 C) (Oral)  Ht $R'5\' 3"'po$  (1.6 m)  Wt 148 lb (67.132 kg)  BMI 26.22 kg/m2  SpO2 96%  LMP 10/12/2012  Review of Systems She denies hypoglycemia and weight change.      Objective:   Physical Exam VITAL SIGNS:  See vs page GENERAL: no distress Pulses: dorsalis pedis intact bilat.   MSK: no deformity of the feet CV: no leg edema Skin:  no ulcer on the feet.  normal color and temp on the feet. Neuro: sensation is intact to touch on the feet.  Ext: There is bilateral onychomycosis of the toenails.    Lab Results  Component Value Date   HGBA1C 10.3* 09/15/2014      Assessment & Plan:  DM: severe exacerbation.  We discussed increasing to 3 injections per day--pt agrees. Noncompliance with cbg recording, worse: I'll work around this as best I can.   Patient is advised the following: Patient Instructions  Please reduce the lantus to 27 units each morning, and: Change the novolog to 3 untis with breakfast, and 5 units with the evening meal.   Please come back for a follow-up appointment in 3 months.  check your blood sugar 4 times a day: before the 3 meals, and at bedtime.  The bedtime check is really important.  also check if you have symptoms of your blood sugar being too high or too low.  please keep a record of the readings and bring it to your next appointment here.  You can write it on any piece of paper.  please call us sooner if your blood sugar goes below 70, or if you have a lot of readings over 200.   blood tests are requested for you today.  We'll contact you with results.

## 2014-10-06 ENCOUNTER — Other Ambulatory Visit: Payer: Self-pay | Admitting: Internal Medicine

## 2014-10-08 ENCOUNTER — Encounter: Payer: Self-pay | Admitting: Internal Medicine

## 2014-10-12 ENCOUNTER — Encounter: Payer: Self-pay | Admitting: Internal Medicine

## 2014-10-12 ENCOUNTER — Ambulatory Visit (INDEPENDENT_AMBULATORY_CARE_PROVIDER_SITE_OTHER): Payer: Medicaid Other | Admitting: Internal Medicine

## 2014-10-12 VITALS — BP 130/70 | HR 87 | Temp 98.7°F | Resp 16 | Ht 62.0 in | Wt 148.5 lb

## 2014-10-12 DIAGNOSIS — N764 Abscess of vulva: Secondary | ICD-10-CM

## 2014-10-12 DIAGNOSIS — L089 Local infection of the skin and subcutaneous tissue, unspecified: Secondary | ICD-10-CM

## 2014-10-12 DIAGNOSIS — B958 Unspecified staphylococcus as the cause of diseases classified elsewhere: Secondary | ICD-10-CM

## 2014-10-12 MED ORDER — SULFAMETHOXAZOLE-TRIMETHOPRIM 800-160 MG PO TABS: 1.0000 | ORAL_TABLET | Freq: Two times a day (BID) | ORAL | Status: DC

## 2014-10-12 NOTE — Progress Notes (Signed)
Pre-visit discussion using our clinic review tool. No additional management support is needed unless otherwise documented below in the visit note.  

## 2014-10-12 NOTE — Patient Instructions (Signed)
You have a staph infection that has caused a "boil."  Please start the antibiotic tonight and take it twice daily for 7 days  Apply hot/warm compressess(washcloths) to the area to encourage it to drain on its own   Please take a probiotic ( Align, Floraque or Culturelle) for 3 weeks if you start the antibiotic to prevent a serious antibiotic associated diarrhea  Called" clostridium dificile colitis" ( should also help prevent   vaginal yeast infection)

## 2014-10-13 DIAGNOSIS — B958 Unspecified staphylococcus as the cause of diseases classified elsewhere: Secondary | ICD-10-CM | POA: Insufficient documentation

## 2014-10-13 DIAGNOSIS — L089 Local infection of the skin and subcutaneous tissue, unspecified: Principal | ICD-10-CM

## 2014-10-13 DIAGNOSIS — N764 Abscess of vulva: Secondary | ICD-10-CM | POA: Insufficient documentation

## 2014-10-13 NOTE — Progress Notes (Signed)
Patient ID: Marcia [name], female   DOB: [birth year], 44 y.o.   MRN: [id number]   Patient Active Problem List   Diagnosis Date Noted  . Staphylococcal infection of skin 10/13/2014  . Furuncle of vulva 10/13/2014  . 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 Encounter for preventive health examination 09/12/2014  . Onychomycosis of toenail 09/10/2014  . Type 1 diabetes mellitus with neurological manifestations, uncontrolled 02/10/2014  . Cough 02/05/2014  . Chronic bronchitis 11/27/2013  . Unspecified constipation 11/11/2013  . Insomnia secondary to anxiety 07/09/2013  . History of hyperthyroidism 07/01/2013  . Snoring disorder 12/26/2012  . Tobacco abuse 05/22/2011  . Tobacco abuse counseling 05/22/2011  . ABNORMAL EKG 09/10/2009    Subjective:  CC:   Chief Complaint  Patient presents with  . Acute Visit    vaginal bumps 2 the size of a dime that are painful to touch.    HPI:   Marcia [name] is a 44 y.o. female who presents for   Recent development of painful nodules on labia majora over the last 72 hours,  No fevers,  Nodules not draining,  Too tender to touch.  Does not shave or wax in this area. Has history of uncontrolled DM    Past Medical History  Diagnosis Date  . Vulvar lesion   . Myocardial infarction 1997    At age 81  . Diabetes mellitus   . History of HPV infection   . Heart murmur   . Hay fever     Allergies  . Vaginal cancer   . Thyroid disease   . Hypercholesterolemia   . Hypothyroidism   . Tobacco abuse     Past Surgical History  Procedure Laterality Date  . Ankle surgery  2010  . Tubal ligation  2004  . Tubal ligation  2004, [facility]  . Abdominal hysterectomy  10/12/12       The following portions of the patient's history were reviewed and updated as appropriate: Allergies, current medications, and problem list.    Review of Systems:   Patient denies headache, fevers, malaise, unintentional weight loss, skin  rash, eye pain, sinus congestion and sinus pain, sore throat, dysphagia,  hemoptysis , cough, dyspnea, wheezing, chest pain, palpitations, orthopnea, edema, abdominal pain, nausea, melena, diarrhea, constipation, flank pain, dysuria, hematuria, urinary  Frequency, nocturia, numbness, tingling, seizures,  Focal weakness, Loss of consciousness,  Tremor, insomnia, depression, anxiety, and suicidal ideation.     History   Social History  . Marital Status: Divorced    Spouse Name: N/A  . Number of Children: N/A  . Years of Education: N/A   Occupational History  . Not on file.   Social History Main Topics  . Smoking status: Current Every Day Smoker -- 1.00 packs/day for 22 years    Types: Cigarettes  . Smokeless tobacco: Never Used     Comment: Down to .5ppd, trying to quit.  Allergic to chantix and e-cigs 02/05/14  . Alcohol Use: No  . Drug Use: No  . Sexual Activity: Yes   Other Topics Concern  . Not on file   Social History Narrative   No regular exercise.   Lives with spouse.    Objective:  Filed Vitals:   10/12/14 1548  BP: 130/70  Pulse: 87  Temp: 98.7 F (37.1 C)  Resp: 16     General appearance: alert, cooperative and appears stated age  Lungs: clear to auscultation bilaterally Heart: regular rate and rhythm, S1,  S2 normal, no murmur, click, rub or gallop Abdomen: soft, non-tender; bowel sounds normal; no masses,  no organomegaly Skin: vulva.abia majora with small enflamed nodule with punctum, NOT DRAINING  NONFLUCTUANT, Lymph nodes: NO INGUINAL LYMPHADENOPATHY.  Assessment and Plan:  Furuncle of vulva Not fluctuant on today's exam butr extremely painful.  Prescribing Septra DS empirically  For one week with probiotic .  Advised to use Hot compresses to apply to area frequently to encourage drainage.  Will need referral to Dr Kenton Kingfisher for Incision and drainage if does not resolve.     Updated Medication List Outpatient Encounter Prescriptions as of 10/12/2014   Medication Sig  . albuterol (PROVENTIL HFA;VENTOLIN HFA) 108 (90 BASE) MCG/ACT inhaler Inhale 2 puffs into the lungs every 6 (six) hours as needed for wheezing or shortness of breath.  Marland Kitchen aspirin 81 MG EC tablet TAKE 1 TABLET (81 MG TOTAL) BY MOUTH DAILY.  Marland Kitchen aspirin 81 MG EC tablet TAKE 1 TABLET BY MOUTH EVERY DAY  . Blood Glucose Monitoring Suppl (ACCU-CHEK AVIVA PLUS) W/DEVICE KIT 1 Device by Does not apply route once.  . Cholecalciferol (VITAMIN D3) 50000 UNITS CAPS Take 50,000 capsules by mouth once a week.  . citalopram (CELEXA) 10 MG tablet TAKE 1 TABLET BY MOUTH EVERY DAY  . doxycycline (VIBRA-TABS) 100 MG tablet Take 1 tablet (100 mg total) by mouth 2 (two) times daily.  Marland Kitchen estradiol (VIVELLE-DOT) 0.05 MG/24HR patch Place 1 patch onto the skin once a week.  . fluconazole (DIFLUCAN) 150 MG tablet Take 1 tablet (150 mg total) by mouth daily.  . Fluticasone-Salmeterol (ADVAIR DISKUS) 250-50 MCG/DOSE AEPB Inhale 1 puff into the lungs 2 (two) times daily.  Marland Kitchen glucose blood (ACCU-CHEK AVIVA) test strip 1 each by Other route 2 (two) times daily. And lancets 2/day  . hydrocortisone cream 0.5 % Apply 1 application topically 2 (two) times daily.  . insulin aspart (NOVOLOG FLEXPEN) 100 UNIT/ML FlexPen 3 units with breakfast, and 5 units with the evening meal.  . Insulin Glargine (LANTUS SOLOSTAR) 100 UNIT/ML Solostar Pen Inject 27 Units into the skin every morning.  Marland Kitchen levothyroxine (SYNTHROID, LEVOTHROID) 112 MCG tablet TAKE 1 TABLET (112 MCG TOTAL) BY MOUTH DAILY.  Marland Kitchen levothyroxine (SYNTHROID, LEVOTHROID) 112 MCG tablet TAKE 1 TABLET (112 MCG TOTAL) BY MOUTH DAILY.  Marland Kitchen losartan (COZAAR) 100 MG tablet Take 1 tablet (100 mg total) by mouth daily.  . metoprolol tartrate (LOPRESSOR) 25 MG tablet Take 1 tablet (25 mg total) by mouth 2 (two) times daily.  Marland Kitchen omeprazole (PRILOSEC) 20 MG capsule Take 1 capsule (20 mg total) by mouth daily.  . simvastatin (ZOCOR) 40 MG tablet Take 40 mg by mouth every evening.   Marland Kitchen Spacer/Aero-Holding Josiah Lobo KIT For use with [facility] and [facility] please substitute the appropriate spacer needed  . SPIRIVA RESPIMAT 2.5 MCG/ACT AERS INHALE 2 PUFFS INTO THE LUNGS DAILY.  Marland Kitchen sulfamethoxazole-trimethoprim (SEPTRA DS) 800-160 MG per tablet Take 1 tablet by mouth 2 (two) times daily.     No orders of the defined types were placed in this encounter.    No Follow-up on file.

## 2014-10-13 NOTE — Assessment & Plan Note (Signed)
Not fluctuant on today's exam butr extremely painful.  Prescribing Septra DS empirically  For one week with probiotic .  Advised to use Hot compresses to apply to area frequently to encourage drainage.  Will need referral to Dr Kenton Kingfisher for Incision and drainage if does not resolve.

## 2014-10-19 ENCOUNTER — Ambulatory Visit (INDEPENDENT_AMBULATORY_CARE_PROVIDER_SITE_OTHER): Payer: Self-pay | Admitting: Internal Medicine

## 2014-10-19 ENCOUNTER — Encounter: Payer: Self-pay | Admitting: Internal Medicine

## 2014-10-19 VITALS — BP 108/70 | HR 75 | Temp 98.4°F | Resp 16 | Ht 62.0 in | Wt 153.5 lb

## 2014-10-19 DIAGNOSIS — Z883 Allergy status to other anti-infective agents status: Secondary | ICD-10-CM

## 2014-10-19 DIAGNOSIS — N764 Abscess of vulva: Secondary | ICD-10-CM

## 2014-10-19 DIAGNOSIS — T7840XA Allergy, unspecified, initial encounter: Secondary | ICD-10-CM

## 2014-10-19 MED ORDER — METHYLPREDNISOLONE ACETATE 40 MG/ML IJ SUSP
40.0000 mg | Freq: Once | INTRAMUSCULAR | Status: AC
  Administered 2014-10-19: 40 mg via INTRAMUSCULAR

## 2014-10-19 MED ORDER — PREDNISONE 10 MG PO TABS: ORAL_TABLET | ORAL | Status: DC

## 2014-10-19 NOTE — Progress Notes (Signed)
Pre-visit discussion using our clinic review tool. No additional management support is needed unless otherwise documented below in the visit note.  

## 2014-10-19 NOTE — Progress Notes (Signed)
Patient ID: Marcia [name], female   DOB: 01-20-1971, 44 y.o.   MRN: [id number]  Patient Active Problem List   Diagnosis Date Noted  . 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 Staphylococcal infection of skin 10/13/2014  . Furuncle of vulva 10/13/2014  . 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 Encounter for preventive health examination 09/12/2014  . Onychomycosis of toenail 09/10/2014  . Type 1 diabetes mellitus with neurological manifestations, uncontrolled 02/10/2014  . Cough 02/05/2014  . Chronic bronchitis 11/27/2013  . Unspecified constipation 11/11/2013  . Insomnia secondary to anxiety 07/09/2013  . History of hyperthyroidism 07/01/2013  . Snoring disorder 12/26/2012  . Tobacco abuse 05/22/2011  . Tobacco abuse counseling 05/22/2011  . ABNORMAL EKG 09/10/2009    Subjective:  CC:   Chief Complaint  Patient presents with  . Rash    Started 10/14/14 itches allver back and shoulders, patient thought it was from the abx but has gotten worse after stoppingthe antiiotic. patient only took 4 doses of the ABx prescribed on Monday.    HPI:   Marcia [name] is a 44 y.o. female who presents for drug rash.   Treated in March 7 with Septra DS for empiric treatemt of vaginal furunucle presumed staph auresu,  After four doses developed a rash which has progressed to torso despite stopping medication last Thursday..  Started on her back and side,  Spread to axillary areas,  No blisters,  Using benadryl and oatmeal soap for the itching.    The vaginal furuncle started to drain spontaneously after applying hot compresses and has resolved   Past Medical History  Diagnosis Date  . Vulvar lesion   . Myocardial infarction 1997    At age 21  . Diabetes mellitus   . History of HPV infection   . Heart murmur   . Hay fever     Allergies  . Vaginal cancer   . Thyroid disease   . Hypercholesterolemia   . Hypothyroidism   . Tobacco abuse     Past  Surgical History  Procedure Laterality Date  . Ankle surgery  2010  . Tubal ligation  2004  . Tubal ligation  2004, [facility]  . Abdominal hysterectomy  10/12/12       The following portions of the patient's history were reviewed and updated as appropriate: Allergies, current medications, and problem list.    Review of Systems:   Patient denies headache, fevers, malaise, unintentional weight loss, skin rash, eye pain, sinus congestion and sinus pain, sore throat, dysphagia,  hemoptysis , cough, dyspnea, wheezing, chest pain, palpitations, orthopnea, edema, abdominal pain, nausea, melena, diarrhea, constipation, flank pain, dysuria, hematuria, urinary  Frequency, nocturia, numbness, tingling, seizures,  Focal weakness, Loss of consciousness,  Tremor, insomnia, depression, anxiety, and suicidal ideation.     History   Social History  . Marital Status: Divorced    Spouse Name: N/A  . Number of Children: N/A  . Years of Education: N/A   Occupational History  . Not on file.   Social History Main Topics  . Smoking status: Current Every Day Smoker -- 1.00 packs/day for 22 years    Types: Cigarettes  . Smokeless tobacco: Never Used     Comment: Down to .5ppd, trying to quit.  Allergic to chantix and e-cigs 02/05/14  . Alcohol Use: No  . Drug Use: No  . Sexual Activity: Yes   Other Topics Concern  . Not on file   Social History Narrative  No regular exercise.   Lives with spouse.    Objective:  Filed Vitals:   10/19/14 1633  BP: 108/70  Pulse: 75  Temp: 98.4 F (36.9 C)  Resp: 16     General appearance: alert, cooperative and appears stated age Lungs: clear to auscultation bilaterally Heart: regular rate and rhythm, S1, S2 normal, no murmur, click, rub or gallop Abdomen: soft, non-tender; bowel sounds normal; no masses,  no organomegaly Pulses: 2+ and symmetric Skin: diffuse patches of papular erythematous rash on back and scapula.  Lymph nodes: Cervical,  supraclavicular, and axillary nodes normal.  Assessment and Plan:  Allergy to antibacterial drug She has developed a drug rash to Septra DS .  Prednisone , anthistamine,    Furuncle of vulva Now resolved despite only having several days of septra due to development of drug rash    Updated Medication List Outpatient Encounter Prescriptions as of 10/19/2014  Medication Sig  . albuterol (PROVENTIL HFA;VENTOLIN HFA) 108 (90 BASE) MCG/ACT inhaler Inhale 2 puffs into the lungs every 6 (six) hours as needed for wheezing or shortness of breath.  Marland Kitchen aspirin 81 MG EC tablet TAKE 1 TABLET (81 MG TOTAL) BY MOUTH DAILY.  Marland Kitchen aspirin 81 MG EC tablet TAKE 1 TABLET BY MOUTH EVERY DAY  . Blood Glucose Monitoring Suppl (ACCU-CHEK AVIVA PLUS) W/DEVICE KIT 1 Device by Does not apply route once.  . Cholecalciferol (VITAMIN D3) 50000 UNITS CAPS Take 50,000 capsules by mouth once a week.  . citalopram (CELEXA) 10 MG tablet TAKE 1 TABLET BY MOUTH EVERY DAY  . doxycycline (VIBRA-TABS) 100 MG tablet Take 1 tablet (100 mg total) by mouth 2 (two) times daily.  Marland Kitchen estradiol (VIVELLE-DOT) 0.05 MG/24HR patch Place 1 patch onto the skin once a week.  . fluconazole (DIFLUCAN) 150 MG tablet Take 1 tablet (150 mg total) by mouth daily.  . Fluticasone-Salmeterol (ADVAIR DISKUS) 250-50 MCG/DOSE AEPB Inhale 1 puff into the lungs 2 (two) times daily.  Marland Kitchen glucose blood (ACCU-CHEK AVIVA) test strip 1 each by Other route 2 (two) times daily. And lancets 2/day  . hydrocortisone cream 0.5 % Apply 1 application topically 2 (two) times daily.  . insulin aspart (NOVOLOG FLEXPEN) 100 UNIT/ML FlexPen 3 units with breakfast, and 5 units with the evening meal.  . Insulin Glargine (LANTUS SOLOSTAR) 100 UNIT/ML Solostar Pen Inject 27 Units into the skin every morning.  Marland Kitchen levothyroxine (SYNTHROID, LEVOTHROID) 112 MCG tablet TAKE 1 TABLET (112 MCG TOTAL) BY MOUTH DAILY.  Marland Kitchen levothyroxine (SYNTHROID, LEVOTHROID) 112 MCG tablet TAKE 1 TABLET (112  MCG TOTAL) BY MOUTH DAILY.  Marland Kitchen losartan (COZAAR) 100 MG tablet Take 1 tablet (100 mg total) by mouth daily.  . metoprolol tartrate (LOPRESSOR) 25 MG tablet Take 1 tablet (25 mg total) by mouth 2 (two) times daily.  Marland Kitchen omeprazole (PRILOSEC) 20 MG capsule Take 1 capsule (20 mg total) by mouth daily.  . simvastatin (ZOCOR) 40 MG tablet Take 40 mg by mouth every evening.  Marland Kitchen Spacer/Aero-Holding Josiah Lobo KIT For use with [facility] and pro air [facility] Pharmacy please substitute the appropriate spacer needed  . SPIRIVA RESPIMAT 2.5 MCG/ACT AERS INHALE 2 PUFFS INTO THE LUNGS DAILY.  Marland Kitchen sulfamethoxazole-trimethoprim (SEPTRA DS) 800-160 MG per tablet Take 1 tablet by mouth 2 (two) times daily.  . predniSONE (DELTASONE) 10 MG tablet 6 tablets on Day 1 , then reduce by 1 tablet daily until gone  . [EXPIRED] methylPREDNISolone acetate (DEPO-MEDROL) injection 40 mg      No orders of  the defined types were placed in this encounter.    No Follow-up on file.

## 2014-10-19 NOTE — Patient Instructions (Signed)
You have developed a rash to Septra , which is a sulfa based antibiotic. Continue the benadryl 25 mg every 8 hours, and add pepcid 10 mg or zantac 75 mg twice daily  (H2 blocker) to further block the reaction  The prednisone taper should help resolve the reaction sooner

## 2014-10-20 DIAGNOSIS — Z883 Allergy status to other anti-infective agents status: Secondary | ICD-10-CM | POA: Insufficient documentation

## 2014-10-20 NOTE — Assessment & Plan Note (Signed)
She has developed a drug rash to Septra DS .  Prednisone , anthistamine,

## 2014-10-20 NOTE — Assessment & Plan Note (Signed)
Now resolved despite only having several days of septra due to development of drug rash

## 2014-10-21 LAB — HM DIABETES EYE EXAM

## 2014-11-24 NOTE — Op Note (Signed)
PATIENT NAME:  Marcia [name], Marcia [name] MR#:  [id number] DATE OF BIRTH:  [birth year]  DATE OF PROCEDURE:  07/23/2012  PREOPERATIVE DIAGNOSIS: Recurrent vulvar epithelial neoplasia (VIN-3).   POSTOPERATIVE DIAGNOSIS: Recurrent vulvar epithelial neoplasia (VIN-3).  PROCEDURE PERFORMED: Laser vaporization.   SURGEON: Weber Cooks, M.D.   ANESTHESIA: General.  ESTIMATED BLOOD LOSS: Zero.   COMPLICATIONS: None.   INDICATION FOR SURGERY: [name] is a 44 year old patient with a long history of vulvar intraepithelial neoplasia and most recently on biopsy she again was noted to have VIN-3. Therefore, the decision was made to proceed with laser vaporization.   FINDINGS AT TIME OF SURGERY: External genitalia with scars from previous treatment. However areas of raised epithelium consistent with VIN, mainly on the anterior portion of the labia minora. A few other scattered areas around the external genitalia. Internal genitalia and vagina within normal limits. Pap smear was normal.   OPERATIVE REPORT: After adequate general anesthesia had been obtained, the patient was prepped and draped in high lithotomy position. The CO2 laser was used at a power setting of 22 to destroy all abnormal areas in layers following the Reid technique. Afterwards the area was cleaned with normal saline and Silvadene cream was applied.         The patient tolerated the procedure well and was taken to the recovery room in stable condition. Pad, sponge, needle and instrument counts were correct x 2. ____________________________ Weber Cooks, MD bem:sb D: 07/23/2012 16:38:56 ET T: 07/24/2012 09:44:24 ET JOB#: [id number]  cc: Weber Cooks, MD, <Dictator> Weber Cooks MD ELECTRONICALLY SIGNED 07/30/2012 10:02

## 2014-11-25 ENCOUNTER — Encounter: Payer: Self-pay | Admitting: Endocrinology

## 2014-11-27 NOTE — Op Note (Signed)
PATIENT NAME:  Marcia [name], Marcia [name] MR#:  [id number] DATE OF BIRTH:  03-25-1971  DATE OF PROCEDURE:  10/10/2012  PREOPERATIVE DIAGNOSIS:  Menorrhagia.   PROCEDURE PERFORMED:  Complete laparoscopic hysterectomy.   SURGEON:  Barnett Applebaum, M.D.   ASSISTANT: Kristen Cardinal, MD  ANESTHESIA:  General.   ESTIMATED BLOOD LOSS:  25 mL.   COMPLICATIONS:  None.   FINDINGS:  Normal ovaries, gallbladder, liver, appendix, bowel.   DISPOSITION:  To recovery room, stable.   TECHNIQUE:  The patient was prepped and draped in the usual sterile fashion after adequate anesthesia is obtained in the dorsal lithotomy position. The bladder is drained with a Foley catheter that is left in place throughout the surgery and goes with the patient to the recovery room. Speculum exam is performed, and the anterior lip of the cervix is grasped with a tenaculum.  A VCare device is placed through the cervix for manipulation purposes.   Attention is then turned to the abdomen where a Veress needle is inserted through a 5 mm infraumbilical incision after Marcaine is used to anesthetize the skin. Veress needle placement is confirmed using the hanging drop technique, and the abdomen is then insufflated with CO2 gas. A 5 mm trocar is then inserted under direct visualization with the laparoscope with no injuries or bleeding noted. The patient is placed in Trendelenburg positioning with the above-mentioned findings visualized.   A 5 mm trocar is placed in the left lower quadrant, and the 11 mm trocar is placed in the right lower quadrant lateral to the inferior epigastric blood vessels with no injuries or bleeding noted. The uterus is grasped at the cornua on each side, and the round ligaments are carefully coagulated and cut with dissection then of the anterior and posterior leaves of the broad ligament. The uterine and ovarian blood vessels and the ligaments are carefully coagulated and cut with preservation of the ovaries and their main  blood supply. Dissection is carried down to the level of the uterine arteries, which are carefully coagulated using the bipolar cautery device. The VCare device is identified at the cervicovaginal junction tenting through the tissues, and a circumferential incision is made with the Harmonic scalpel to completely dissect the uterus and cervix free. There is no apparent injury or complication involved in the bladder or ureter structures. The uterus is removed vaginally but grasping it with a tenaculum and is left in the vagina while suturing to maintain pneumoperitoneum, then it is removed and sent to pathology for further review.   Using the Endo Stitch device and 1-0 Polysorb sutures, the vaginal cuff is closed in an interrupted fashion. Six sutures are applied. Vaginal bimanual examination reveals complete closure. The pelvic cavity is irrigated with aspiration of fluid. No bleeding is noted. All structures appear to be sound without injury or complication. Gas is expelled. Trocars are removed, and skin is closed with Dermabond. The patient is taken to the recovery room in stable condition. All sponge, instrument and needle counts are correct at the conclusion of the case.    ____________________________ R. Barnett Applebaum, MD rph:dmm D: 10/10/2012 09:23:30 ET T: 10/10/2012 09:41:14 ET JOB#: [id number]  cc: Glean Salen, MD, <Dictator> Gae Dry MD ELECTRONICALLY SIGNED 10/11/2012 7:45

## 2014-11-27 NOTE — Op Note (Signed)
PATIENT NAME:  Marcia [name], Marcia [name] MR#:  [id number] DATE OF BIRTH:  1970/10/01  DATE OF PROCEDURE:  04/01/2013  PREOPERATIVE  DIAGNOSIS:  Abdominal pain and ovarian cyst.   POSTOPERATIVE DIAGNOSIS: Bilateral ovarian cysts with right ovarian torsion and adhesions.   PROCEDURE PERFORMED: Operative laparoscopy with right salpingo-oophorectomy and left ovarian cystectomy and lysis of adhesions.   SURGEON: Barnett Applebaum, M.D.   ANESTHESIA: General.   ESTIMATED BLOOD LOSS: 50 mL.   COMPLICATIONS: None.   FINDINGS: The patient had bilateral ovarian cysts worse on the right side with a solid component and torsion evident on the right side. Frozen section analysis revealed torsion effect but no evidence for malignancy. There were also adhesions of the omentum to the anterior abdominal wall.   DISPOSITION: To the recovery room in stable condition.   TECHNIQUE: The patient is prepped and draped in the usual sterile fashion after adequate anesthesia is obtained in the dorsal lithotomy position. A Foley catheter is inserted. Attention is turned to the abdomen where a Veress needle is inserted through a 5 mm infraumbilical incision after Marcaine is used to anesthetize the skin. Veress needle placement is confirmed using the hanging drop technique and the abdomen is then insufflated with CO2 gas. A 5 mm trocar is then inserted under direct visualization with the laparoscope with no injuries or bleeding noted. The patient is placed in Trendelenburg positioning. A 5 mm trocar is placed in the left lower quadrant and an 11 mm trocar is placed in the right lower quadrant lateral to the inferior epigastric blood vessels with no injuries or bleeding noted. Adhesions are noted of omental tissues to the anterior abdominal wall and these are dissected free at the level of the abdominal wall using a 5 mm Harmonic scalpel with no injuries to bowel, observed and no bleeding noted. The findings of the ovaries are observed.  The right ovary is very firm and has a discoloration to it consistent with torsion. The tube and ovary are then removed at this time using a Harmonic scalpel to carefully coagulated and cut through the infundibular blood vessels and their ligaments. There are no adhesions in this area and the ovary and tube were easily removed. Excellent hemostasis is noted. The ovary and tube is placed in an Endopouch and removed to the right lower quadrant incision. The left ovary is noted to have a couple of less than 1 cm cysts within its base of the ovary and these are punctured and drained using the 5 mm Harmonic scalpel with some tissue removed for excision of cyst although this is a minimal amount of tissue removed. There is no evidence for torsion on the left side.   The pelvic cavity was irrigated with aspiration of all fluid. Excellent hemostasis noted. There is no apparent dissection near the ureters and no apparent injury to bowel, bladder or ureter structures. Gas is expelled. Trocars are removed. The right lower quadrant fascial incision is closed with a 0 Vicryl suture and the skin with 4-0 Vicryl suture in a subcuticular fashion and then all 3 incisions were closed with Dermabond at the surface level. Foley catheter is removed. The patient goes to the recovery room in stable condition. All sponge, instrument, and needle counts are correct.     ____________________________ R. Barnett Applebaum, MD rph:dp D: 04/01/2013 13:21:06 ET T: 04/01/2013 13:57:27 ET JOB#: [id number]  cc: Glean Salen, MD, <Dictator> Gae Dry MD ELECTRONICALLY SIGNED 04/01/2013 18:26

## 2014-12-14 ENCOUNTER — Encounter: Payer: Self-pay | Admitting: Endocrinology

## 2014-12-14 ENCOUNTER — Ambulatory Visit (INDEPENDENT_AMBULATORY_CARE_PROVIDER_SITE_OTHER): Payer: Medicaid Other | Admitting: Endocrinology

## 2014-12-14 VITALS — BP 118/78 | HR 82 | Temp 97.7°F | Wt 149.0 lb

## 2014-12-14 DIAGNOSIS — E1065 Type 1 diabetes mellitus with hyperglycemia: Principal | ICD-10-CM

## 2014-12-14 DIAGNOSIS — E1041 Type 1 diabetes mellitus with diabetic mononeuropathy: Secondary | ICD-10-CM

## 2014-12-14 DIAGNOSIS — IMO0002 Reserved for concepts with insufficient information to code with codable children: Secondary | ICD-10-CM

## 2014-12-14 DIAGNOSIS — E1049 Type 1 diabetes mellitus with other diabetic neurological complication: Secondary | ICD-10-CM

## 2014-12-14 LAB — HEMOGLOBIN A1C: HEMOGLOBIN A1C: 10.6 % — AB (ref 4.6–6.5)

## 2014-12-14 MED ORDER — INSULIN ASPART 100 UNIT/ML FLEXPEN: PEN_INJECTOR | SUBCUTANEOUS | Status: AC

## 2014-12-14 NOTE — Patient Instructions (Addendum)
Please continue the lantus, 27 units each morning, and: Change the novolog to 4 units with breakfast, and 4 units with the evening meal.   Please try to minimize eating after the evening meal.  Please come back for a follow-up appointment in 3 months.  check your blood sugar 4 times a day: before the 3 meals, and at bedtime.  The bedtime check is really important.  also check if you have symptoms of your blood sugar being too high or too low.  please keep a record of the readings and bring it to your next appointment here.  You can write it on any piece of paper.  please call us sooner if your blood sugar goes below 70, or if you have a lot of readings over 200.   blood tests are requested for you today.  We'll contact you with results.

## 2014-12-14 NOTE — Progress Notes (Signed)
Subjective:    Patient ID: Marcia [name], female    DOB: September 25, 1970, 44 y.o.   MRN: [id number]  HPI Pt returns for f/u of diabetes mellitus: DM type: 1 Dx'ed: 4098 Complications: polyneuropathy and retinopathy.  Therapy: insulin since dx.  GDM: never DKA: only at dx Severe hypoglycemia: last episode was 2004.  Pancreatitis: never Other: she stopped pump rx in mid-2015, due to poor results; she then had poor results with bid premixed insulin; she was changed to qd lantus and qd humalog.   Interval history: she brings a record of her cbg's which i have reviewed today.  It varies from 82-432.  It is most consistently high in am (she eats at hs), and at lunch.   Past Medical History  Diagnosis Date  . Vulvar lesion   . Myocardial infarction 1997    At age 21  . Diabetes mellitus   . History of HPV infection   . Heart murmur   . Hay fever     Allergies  . Vaginal cancer   . Thyroid disease   . Hypercholesterolemia   . Hypothyroidism   . Tobacco abuse     Past Surgical History  Procedure Laterality Date  . Ankle surgery  2010  . Tubal ligation  2004  . Tubal ligation  2004, [facility]  . Abdominal hysterectomy  10/12/12    History   Social History  . Marital Status: Divorced    Spouse Name: N/A  . Number of Children: N/A  . Years of Education: N/A   Occupational History  . Not on file.   Social History Main Topics  . Smoking status: Current Every Day Smoker -- 1.00 packs/day for 22 years    Types: Cigarettes  . Smokeless tobacco: Never Used     Comment: Down to .5ppd, trying to quit.  Allergic to chantix and e-cigs 02/05/14  . Alcohol Use: No  . Drug Use: No  . Sexual Activity: Yes   Other Topics Concern  . Not on file   Social History Narrative   No regular exercise.   Lives with spouse.    Current Outpatient Prescriptions on File Prior to Visit  Medication Sig Dispense Refill  . albuterol (PROVENTIL HFA;VENTOLIN HFA) 108 (90 BASE) MCG/ACT inhaler Inhale  2 puffs into the lungs every 6 (six) hours as needed for wheezing or shortness of breath. 1 Inhaler 11  . aspirin 81 MG EC tablet TAKE 1 TABLET (81 MG TOTAL) BY MOUTH DAILY. 30 tablet 11  . aspirin 81 MG EC tablet TAKE 1 TABLET BY MOUTH EVERY DAY 30 tablet 11  . Blood Glucose Monitoring Suppl (ACCU-CHEK AVIVA PLUS) W/DEVICE KIT 1 Device by Does not apply route once. 1 kit 0  . Cholecalciferol (VITAMIN D3) 50000 UNITS CAPS Take 50,000 capsules by mouth once a week. 4 capsule 2  . citalopram (CELEXA) 10 MG tablet TAKE 1 TABLET BY MOUTH EVERY DAY 30 tablet 2  . doxycycline (VIBRA-TABS) 100 MG tablet Take 1 tablet (100 mg total) by mouth 2 (two) times daily. 20 tablet 0  . estradiol (VIVELLE-DOT) 0.05 MG/24HR patch Place 1 patch onto the skin once a week.    . fluconazole (DIFLUCAN) 150 MG tablet Take 1 tablet (150 mg total) by mouth daily. 2 tablet 0  . Fluticasone-Salmeterol (ADVAIR DISKUS) 250-50 MCG/DOSE AEPB Inhale 1 puff into the lungs 2 (two) times daily. 60 each 6  . glucose blood (ACCU-CHEK AVIVA) test strip 1 each by Other route 2 (  two) times daily. And lancets 2/day 100 each 12  . hydrocortisone cream 0.5 % Apply 1 application topically 2 (two) times daily. 30 g 0  . Insulin Glargine (LANTUS SOLOSTAR) 100 UNIT/ML Solostar Pen Inject 27 Units into the skin every morning. 5 pen PRN  . levothyroxine (SYNTHROID, LEVOTHROID) 112 MCG tablet TAKE 1 TABLET (112 MCG TOTAL) BY MOUTH DAILY. 30 tablet 0  . levothyroxine (SYNTHROID, LEVOTHROID) 112 MCG tablet TAKE 1 TABLET (112 MCG TOTAL) BY MOUTH DAILY. 30 tablet 1  . losartan (COZAAR) 100 MG tablet Take 1 tablet (100 mg total) by mouth daily. 30 tablet 11  . metoprolol tartrate (LOPRESSOR) 25 MG tablet Take 1 tablet (25 mg total) by mouth 2 (two) times daily. 180 tablet 3  . omeprazole (PRILOSEC) 20 MG capsule Take 1 capsule (20 mg total) by mouth daily. 90 capsule 3  . predniSONE (DELTASONE) 10 MG tablet 6 tablets on Day 1 , then reduce by 1 tablet  daily until gone 21 tablet 0  . simvastatin (ZOCOR) 40 MG tablet Take 40 mg by mouth every evening.    Marland Kitchen Spacer/Aero-Holding Josiah Lobo KIT For use with [facility] and pro air [facility] Pharmacy please substitute the appropriate spacer needed 1 each 1  . SPIRIVA RESPIMAT 2.5 MCG/ACT AERS INHALE 2 PUFFS INTO THE LUNGS DAILY. 4 g 5  . sulfamethoxazole-trimethoprim (SEPTRA DS) 800-160 MG per tablet Take 1 tablet by mouth 2 (two) times daily. 14 tablet 0   No current facility-administered medications on file prior to visit.    Allergies  Allergen Reactions  . Chantix [Varenicline] Rash  . Septra [Sulfamethoxazole-Trimethoprim] Rash    Family History  Problem Relation Age of Onset  . Cancer Other     Aunt-breast; grandmother- lung and ovarian  . Diabetes Father   . Hypertension Mother   . Hyperlipidemia Mother   . Aneurysm Brother     BP 118/78 mmHg  Pulse 82  Temp(Src) 97.7 F (36.5 C) (Oral)  Wt 149 lb (67.586 kg)  SpO2 92%  LMP 10/12/2012  Review of Systems Denies LOC and weight change    Objective:   Physical Exam VITAL SIGNS:  See vs page GENERAL: no distress Pulses: dorsalis pedis intact bilat.   MSK: no deformity of the feet CV: no leg edema Skin:  no ulcer on the feet.  normal color and temp on the feet. Neuro: sensation is intact to touch on the feet.   Ext: There is bilateral onychomycosis of the toenails.    Lab Results  Component Value Date   HGBA1C 10.6* 12/14/2014      Assessment & Plan:  DM: worse  Patient is advised the following: Patient Instructions  Please continue the lantus, 27 units each morning, and: Change the novolog to 4 units with breakfast, and 4 units with the evening meal.   Please try to minimize eating after the evening meal.  Please come back for a follow-up appointment in 3 months.  check your blood sugar 4 times a day: before the 3 meals, and at bedtime.  The bedtime check is really important.  also check if you have symptoms of your  blood sugar being too high or too low.  please keep a record of the readings and bring it to your next appointment here.  You can write it on any piece of paper.  please call us sooner if your blood sugar goes below 70, or if you have a lot of readings over 200.   blood tests  are requested for you today.  We'll contact you with results.

## 2014-12-17 ENCOUNTER — Telehealth: Payer: Self-pay | Admitting: Endocrinology

## 2014-12-17 NOTE — Telephone Encounter (Signed)
LM for pt to call back so I can discuss with her the [place] access issue.  She has an office on her card that she is not a pt of and needs to either be seen or have the md changed.

## 2015-01-08 ENCOUNTER — Telehealth: Payer: Self-pay | Admitting: *Deleted

## 2015-01-08 NOTE — Telephone Encounter (Signed)
Patient referred by Dr. Derrel Nip to [facility]. Patient's bp meds changed from Lisinopril to Losartan. She was to f/u in 6 weeks. Last ov was 02/05/14. Pt no-showed last ov. Rx has been deferred to PCP Dr. Derrel Nip.

## 2015-01-14 ENCOUNTER — Telehealth: Payer: Self-pay | Admitting: Pulmonary Disease

## 2015-01-14 ENCOUNTER — Telehealth: Payer: Self-pay | Admitting: *Deleted

## 2015-01-14 NOTE — Telephone Encounter (Signed)
Fax from pharmacy, needing PA for Losartan. Started online, pending response.

## 2015-01-14 NOTE — Telephone Encounter (Signed)
[facility] sent PA request for Losartan Called and told them we faxed this back on 01/08/15 Rx needs to go to Dr. Derrel Nip who is pcp. Pt was started on this medication 02/05/14 but never followed up.

## 2015-02-01 ENCOUNTER — Telehealth: Payer: Self-pay | Admitting: *Deleted

## 2015-02-01 NOTE — Telephone Encounter (Signed)
Fax from pharmacy, needing PA for Spiriva Respimat. Started online, pending questions to be sent

## 2015-02-01 NOTE — Telephone Encounter (Addendum)
PA completed online, pending response. According to [employer], Spiriva is a preferred drug.

## 2015-02-16 ENCOUNTER — Telehealth: Payer: Self-pay | Admitting: *Deleted

## 2015-02-16 DIAGNOSIS — Z7689 Persons encountering health services in other specified circumstances: Secondary | ICD-10-CM

## 2015-02-16 NOTE — Telephone Encounter (Signed)
Received pt PA request form for the Spiriva, placed in Dr. Derrel Nip box to fill out

## 2015-03-02 ENCOUNTER — Other Ambulatory Visit: Payer: Self-pay | Admitting: *Deleted

## 2015-03-02 MED ORDER — TIOTROPIUM BROMIDE MONOHYDRATE 2.5 MCG/ACT IN AERS: INHALATION_SPRAY | RESPIRATORY_TRACT | Status: DC

## 2015-03-04 ENCOUNTER — Encounter: Payer: Self-pay | Admitting: *Deleted

## 2015-03-10 ENCOUNTER — Inpatient Hospital Stay: Payer: Medicaid Other | Attending: Obstetrics and Gynecology | Admitting: Obstetrics and Gynecology

## 2015-03-10 ENCOUNTER — Other Ambulatory Visit: Payer: Self-pay | Admitting: *Deleted

## 2015-03-10 VITALS — BP 132/77 | HR 79 | Temp 98.5°F | Resp 18 | Ht 62.0 in | Wt 137.3 lb

## 2015-03-10 DIAGNOSIS — N901 Moderate vulvar dysplasia: Secondary | ICD-10-CM

## 2015-03-10 DIAGNOSIS — D071 Carcinoma in situ of vulva: Secondary | ICD-10-CM | POA: Insufficient documentation

## 2015-03-10 NOTE — Progress Notes (Signed)
Gynecologic Oncology Interval Note  Chief Concern: Vulvar dysplasia surveillence. VIN III  Subjective:  Marcia [name] is a 44 y.o. woman who presents today for continued surveillance for history of vulvar dysplasia VINII.   She has vulvar itching being treated with OTC steroid cream. Crusted area on mons.    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 1/14     laser vaporization of raised epithelium consistent with VIN, mainly on the anterior portion of the labia minora. A few other scattered areas around the external genitalia.   Prior hysterectomy  GYN History:   Gravida 3    Para 2    Age at Menarche 13    Regular Pap Smears Yes    History of HPV Infection    Cycle Normal  LMP 08-23-2009    Additional Hx no other gyn problems in the past   Problem List: Patient Active Problem List   Diagnosis Date Noted  . 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 Staphylococcal infection of skin 10/13/2014  . Furuncle of vulva 10/13/2014  . 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 Encounter for preventive health examination 09/12/2014  . Onychomycosis of toenail 09/10/2014  . Type 1 diabetes mellitus with neurological manifestations, uncontrolled 02/10/2014  . Cough 02/05/2014  . Chronic bronchitis 11/27/2013  . Unspecified constipation 11/11/2013  . Insomnia secondary to anxiety 07/09/2013  . History of hyperthyroidism 07/01/2013  . Snoring disorder 12/26/2012  . Tobacco abuse 05/22/2011  . Tobacco abuse counseling 05/22/2011  . ABNORMAL EKG 09/10/2009    Past Medical History: Past Medical History  Diagnosis Date  . Vulvar  lesion   . Myocardial infarction 1997    At age 44  . Diabetes mellitus   . History of HPV infection   . Heart murmur   . Hay fever     Allergies  . Vaginal cancer   . Thyroid disease   . Hypercholesterolemia   . Hypothyroidism   . Tobacco abuse     Past Surgical History: Past Surgical History  Procedure Laterality Date  . Ankle surgery  2010  . Tubal ligation  2004  . Tubal ligation  2004, [facility]  . Abdominal hysterectomy  10/12/12    Family History: Family History  Problem Relation Age of Onset  . Cancer Other     Aunt-breast; grandmother- lung and ovarian  . Diabetes Father   . Hypertension Mother   . Hyperlipidemia Mother   . Aneurysm Brother     Social History: History   Social History  . Marital Status: Divorced    Spouse Name: N/A  . Number of Children: N/A  . Years of Education: N/A   Occupational History  . Not on file.   Social History Main Topics  . Smoking status: Current Every Day Smoker -- 1.00 packs/day for 22 years    Types: Cigarettes  . Smokeless tobacco: Never Used     Comment: Down to .5ppd, trying to quit.  Allergic  to chantix and e-cigs 02/05/14  . Alcohol Use: No  . Drug Use: No  . Sexual Activity: Yes   Other Topics Concern  . Not on file   Social History Narrative   No regular exercise.   Lives with spouse.    Allergies: Allergies  Allergen Reactions  . Chantix [Varenicline] Rash  . Septra [Sulfamethoxazole-Trimethoprim] Rash    Current Medications: Current Outpatient Prescriptions  Medication Sig Dispense Refill  . albuterol (PROVENTIL HFA;VENTOLIN HFA) 108 (90 BASE) MCG/ACT inhaler Inhale 2 puffs into the lungs every 6 (six) hours as needed for wheezing or shortness of breath. 1 Inhaler 11  . aspirin 81 MG EC tablet TAKE 1 TABLET BY MOUTH EVERY DAY 30 tablet 11  . Blood Glucose Monitoring Suppl (ACCU-CHEK AVIVA PLUS) W/DEVICE KIT 1 Device by Does not apply route once. 1 kit 0  . citalopram (CELEXA) 10 MG tablet TAKE 1  TABLET BY MOUTH EVERY DAY 30 tablet 2  . estradiol (VIVELLE-DOT) 0.05 MG/24HR patch Place 1 patch onto the skin once a week.    . Fluticasone-Salmeterol (ADVAIR DISKUS) 250-50 MCG/DOSE AEPB Inhale 1 puff into the lungs 2 (two) times daily. 60 each 6  . glucose blood (ACCU-CHEK AVIVA) test strip 1 each by Other route 2 (two) times daily. And lancets 2/day 100 each 12  . insulin aspart (NOVOLOG FLEXPEN) 100 UNIT/ML FlexPen 4 units with breakfast, and 4 units with the evening meal. 15 mL 11  . Insulin Glargine (LANTUS SOLOSTAR) 100 UNIT/ML Solostar Pen Inject 27 Units into the skin every morning. 5 pen PRN  . levothyroxine (SYNTHROID, LEVOTHROID) 112 MCG tablet TAKE 1 TABLET (112 MCG TOTAL) BY MOUTH DAILY. 30 tablet 1  . metoprolol tartrate (LOPRESSOR) 25 MG tablet Take 1 tablet (25 mg total) by mouth 2 (two) times daily. 180 tablet 3  . simvastatin (ZOCOR) 40 MG tablet Take 40 mg by mouth every evening.    Marland Kitchen Spacer/Aero-Holding Josiah Lobo KIT For use with [facility] and pro air [facility] please substitute the appropriate spacer needed 1 each 1  . Tiotropium Bromide Monohydrate (SPIRIVA RESPIMAT) 2.5 MCG/ACT AERS INHALE 2 PUFFS INTO THE LUNGS DAILY. 4 g 5  . losartan (COZAAR) 100 MG tablet Take 1 tablet (100 mg total) by mouth daily. 30 tablet 11   No current facility-administered medications for this visit.    Review of Systems Pertinent items are noted in HPI.  Objective:  Physical Examination:  BP 132/77 mmHg  Pulse 79  Temp(Src) 98.5 F (36.9 C) (Oral)  Resp 18  Ht _0  (1.575 m)  Wt 137 lb 5.6 oz (62.3 kg)  BMI 25.11 kg/m2  LMP 10/12/2012  ECOG Performance Status: 1 - Symptomatic but completely ambulatory  General appearance: alert, cooperative and appears stated age HEENT:PERRLA, neck supple with midline trachea and thyroid without masses Lymph node survey: non-palpable, axillary, inguinal, supraclavicular Cardiovascular: regular rate and rhythm, no murmurs or  gallops Respiratory: normal air entry, lungs clear to auscultation Abdomen: soft, non-tender, without masses or organomegaly, no hernias and well healed incision Back: inspection of back is normal Extremities: extremities normal, atraumatic, no cyanosis or edema Skin exam - normal coloration and turgor, no rashes, no suspicious skin lesions noted. Neurological exam reveals alert, oriented, normal speech, no focal findings or movement disorder noted.  Pelvic: exam chaperoned by nurse;  Vulva: vulva with reddish area on upper left labia; Hair follicle infection healing on mons. Vagina: normal; Adnexa: normal adnexa in size, nontender and no masses; Uterus: absent;  Cervix: absent; Rectal: no masses  Procedure note: Informed consent obtained and time out performed.  Colposcopy of the vulva showed about a 3 cm area of acetowhite epithelium on both sides of the upper labia minora.  Also scattered small areas on the right labia lower down.  Betadine prep done and 1 ml of 2% lidocaine was injected in the left labia.  Small skin biopsy taken with cervical biopsy forceps.  Cauterized with silver nitrate.  Assessment:  Marcia [name] is a 44 y.o. female with a history of vulvar dysplasia VIN III last lasered in 1/14. Evidence of recurrent dysplasia on exam today.  Smokes half a pack a day.   Plan:   Problem List Items Addressed This Visit      Musculoskeletal and Integument   VIN (vulval intraepithelial neoplasia) II - Primary     Biopsy done and suggested repeat laser ablation in one week.  She is comfortable with the plan.   Discussed importance of tobacco cessation in controlling VIN in the future.  Mellody Drown, MD  CC:  Crecencio Mc, MD [address], [place] [address] [phone]

## 2015-03-10 NOTE — Addendum Note (Signed)
Addended by: Mellody Drown on: 03/10/2015 04:05 PM   Modules accepted: Level of Service

## 2015-03-11 ENCOUNTER — Telehealth: Payer: Self-pay | Admitting: *Deleted

## 2015-03-11 NOTE — Telephone Encounter (Signed)
Surgery planned for 03/17/15. Patient made aware of her preop at 1515. Pt grateful for the return phone call.

## 2015-03-15 ENCOUNTER — Encounter
Admission: RE | Admit: 2015-03-15 | Discharge: 2015-03-15 | Disposition: A | Discharge status: Home / Self Care | Payer: Medicaid Other | Source: Ambulatory Visit | Attending: Obstetrics and Gynecology | Admitting: Obstetrics and Gynecology

## 2015-03-15 DIAGNOSIS — Z01810 Encounter for preprocedural cardiovascular examination: Secondary | ICD-10-CM | POA: Diagnosis not present

## 2015-03-15 DIAGNOSIS — R9431 Abnormal electrocardiogram [ECG] [EKG]: Secondary | ICD-10-CM | POA: Diagnosis not present

## 2015-03-15 DIAGNOSIS — Z01812 Encounter for preprocedural laboratory examination: Secondary | ICD-10-CM | POA: Diagnosis present

## 2015-03-15 LAB — BASIC METABOLIC PANEL
Anion gap: 14 (ref 5–15)
BUN: 15 mg/dL (ref 6–20)
CO2: 19 mmol/L — AB (ref 22–32)
Calcium: 8.9 mg/dL (ref 8.9–10.3)
Chloride: 99 mmol/L — ABNORMAL LOW (ref 101–111)
Creatinine, Ser: 0.75 mg/dL (ref 0.44–1.00)
GFR calc Af Amer: >60 mL/min (ref >60)
GFR calc non Af Amer: >60 mL/min (ref >60)
Glucose, Bld: 203 mg/dL — ABNORMAL HIGH (ref 65–99)
Potassium: 3.9 mmol/L (ref 3.5–5.1)
Sodium: 132 mmol/L — ABNORMAL LOW (ref 135–145)

## 2015-03-15 LAB — CBC WITH DIFFERENTIAL/PLATELET
BASOS ABS: 0.1 10*3/uL (ref 0–0.1)
Basophils Relative: 0 %
EOS PCT: 1 %
Eosinophils Absolute: 0.1 10*3/uL (ref 0–0.7)
HEMATOCRIT: 48.4 % — AB (ref 35.0–47.0)
Hemoglobin: 16.3 g/dL — ABNORMAL HIGH (ref 12.0–16.0)
LYMPHS PCT: 20 %
Lymphs Abs: 2.3 10*3/uL (ref 1.0–3.6)
MCH: 30.9 pg (ref 26.0–34.0)
MCHC: 33.7 g/dL (ref 32.0–36.0)
MCV: 91.6 fL (ref 80.0–100.0)
MONO ABS: 1 10*3/uL — AB (ref 0.2–0.9)
Monocytes Relative: 9 %
NEUTROS ABS: 8.1 10*3/uL — AB (ref 1.4–6.5)
Neutrophils Relative %: 70 %
PLATELETS: 228 10*3/uL (ref 150–440)
RBC: 5.28 MIL/uL — ABNORMAL HIGH (ref 3.80–5.20)
RDW: 12.8 % (ref 11.5–14.5)
WBC: 11.6 10*3/uL — AB (ref 3.6–11.0)

## 2015-03-15 LAB — SURGICAL PATHOLOGY

## 2015-03-15 NOTE — Patient Instructions (Signed)
  Your procedure is scheduled on: Wednesday March 17, 2015 Report to Same Day Surgery. To find out your arrival time please call [phone] between 1PM - 3PM on Tuesday August 9,2016.  Remember: Instructions that are not followed completely may result in serious medical risk, up to and including death, or upon the discretion of your surgeon and anesthesiologist your surgery may need to be rescheduled.    _x__ 1. Do not eat food or drink liquids after midnight. No gum chewing or hard candies.     ____ 2. No Alcohol for 24 hours before or after surgery.   ____ 3. Bring all medications with you on the day of surgery if instructed.    _x_ 4. Notify your doctor if there is any change in your medical condition     (cold, fever, infections).     Do not wear jewelry, make-up, hairpins, clips or nail polish.  Do not wear lotions, powders, or perfumes. You may wear deodorant.  Do not shave 48 hours prior to surgery. Men may shave face and neck.  Do not bring valuables to the hospital.    [facility] is not responsible for any belongings or valuables.               Contacts, dentures or bridgework may not be worn into surgery.  Leave your suitcase in the car. After surgery it may be brought to your room.  For patients admitted to the hospital, discharge time is determined by your treatment team.   Patients discharged the day of surgery will not be allowed to drive home.    Please read over the following fact sheets that you were given:     __x Take these medicines the morning of surgery with A SIP OF WATER:    1. citalopram (CELEXA)   2. levothyroxine (SYNTHROID, LEVOTHROID)   ____ Fleet Enema (as directed)   ____ Use CHG Soap as directed __x__ Use inhalers on the day of surgery  ____ Stop metformin 2 days prior to surgery    __x__ Take 1/2 of usual insulin dose the night before surgery and none on the morning of surgery.   ____ Stop aspirin on does not apply.  ____ Stop  Anti-inflammatories on does not apply.   ____ Stop supplements until after surgery.    ____ Bring C-Pap to the hospital.

## 2015-03-16 ENCOUNTER — Telehealth: Payer: Self-pay | Admitting: *Deleted

## 2015-03-16 NOTE — OR Nursing (Signed)
DR [facility] OFFICE NOTIFIED PATIENT NEEDS CARDIAC CLEARANCE PER PCP OFFICE. PCP OFFICE STATED THEY HAVE BEEN UNABLE TO REACH PATIENT TO LET HER KNOW.

## 2015-03-16 NOTE — OR Nursing (Signed)
SPOKE WITH DR Caprice Renshaw RE CLEARANCE NEEDED. ALSO SPOKE WITH PCP OFFICE EARLIER THIS AM

## 2015-03-16 NOTE — Telephone Encounter (Signed)
Called stating that surgery was cancelled " something about cardiac and I want to know what is going on" I called Dr Clarita Crane office in [place] who referred me to contact Marcia [name] at [facility] and was told that her EKG is abn and with Marcia [name] h/o MI anesthesia wants cardiac clearance. They had contacted her PMD who has been unable to reach Marcia [name] to set her up with cardiologist. I conveyed this to the patient adn she will call Dr Matthew Saras to get set up for clearance

## 2015-03-16 NOTE — OR Nursing (Signed)
Clearance request by Dr Kayleen Memos faxed to Merlyn Lot NP and also Dr Fransisca Connors

## 2015-03-17 ENCOUNTER — Encounter: Admission: RE | Payer: Self-pay | Source: Ambulatory Visit

## 2015-03-17 ENCOUNTER — Ambulatory Visit: Admission: RE | Admit: 2015-03-17 | Payer: Medicaid Other | Source: Ambulatory Visit

## 2015-03-17 SURGERY — VULVAR LESION
Anesthesia: General

## 2015-03-18 ENCOUNTER — Ambulatory Visit: Payer: Medicaid Other | Admitting: Endocrinology

## 2015-03-22 ENCOUNTER — Telehealth: Payer: Self-pay | Admitting: *Deleted

## 2015-03-24 NOTE — Telephone Encounter (Signed)
I emailed Dr. Fransisca Connors regarding this today; awaiting his response.

## 2015-03-25 NOTE — Telephone Encounter (Signed)
Scheduling message entered.Marland KitchenMarland Kitchen

## 2015-04-03 ENCOUNTER — Other Ambulatory Visit: Payer: Self-pay | Admitting: Endocrinology

## 2015-04-03 ENCOUNTER — Other Ambulatory Visit: Payer: Self-pay | Admitting: Internal Medicine

## 2015-04-05 ENCOUNTER — Other Ambulatory Visit: Payer: Medicaid Other

## 2015-04-06 NOTE — OR Nursing (Signed)
CLEARED BY Dr Humphrey Rolls low risk

## 2015-04-07 ENCOUNTER — Ambulatory Visit
Admission: RE | Admit: 2015-04-07 | Discharge: 2015-04-07 | Disposition: A | Discharge status: Home / Self Care | Payer: Medicaid Other | Source: Ambulatory Visit | Attending: Obstetrics and Gynecology | Admitting: Obstetrics and Gynecology

## 2015-04-07 ENCOUNTER — Other Ambulatory Visit: Payer: Self-pay | Admitting: Obstetrics and Gynecology

## 2015-04-07 ENCOUNTER — Encounter
Admission: RE | Disposition: A | Discharge status: Home / Self Care | Payer: Self-pay | Source: Ambulatory Visit | Attending: Obstetrics and Gynecology

## 2015-04-07 ENCOUNTER — Ambulatory Visit: Payer: Medicaid Other | Admitting: *Deleted

## 2015-04-07 ENCOUNTER — Encounter: Payer: Self-pay | Admitting: *Deleted

## 2015-04-07 DIAGNOSIS — I252 Old myocardial infarction: Secondary | ICD-10-CM | POA: Insufficient documentation

## 2015-04-07 DIAGNOSIS — E109 Type 1 diabetes mellitus without complications: Secondary | ICD-10-CM | POA: Insufficient documentation

## 2015-04-07 DIAGNOSIS — E785 Hyperlipidemia, unspecified: Secondary | ICD-10-CM | POA: Diagnosis not present

## 2015-04-07 DIAGNOSIS — Z794 Long term (current) use of insulin: Secondary | ICD-10-CM | POA: Insufficient documentation

## 2015-04-07 DIAGNOSIS — F17210 Nicotine dependence, cigarettes, uncomplicated: Secondary | ICD-10-CM | POA: Insufficient documentation

## 2015-04-07 DIAGNOSIS — Z79899 Other long term (current) drug therapy: Secondary | ICD-10-CM | POA: Diagnosis not present

## 2015-04-07 DIAGNOSIS — Z8619 Personal history of other infectious and parasitic diseases: Secondary | ICD-10-CM | POA: Diagnosis not present

## 2015-04-07 DIAGNOSIS — N94819 Vulvodynia, unspecified: Secondary | ICD-10-CM

## 2015-04-07 DIAGNOSIS — K6289 Other specified diseases of anus and rectum: Secondary | ICD-10-CM | POA: Diagnosis not present

## 2015-04-07 DIAGNOSIS — E039 Hypothyroidism, unspecified: Secondary | ICD-10-CM | POA: Diagnosis not present

## 2015-04-07 DIAGNOSIS — Z7982 Long term (current) use of aspirin: Secondary | ICD-10-CM | POA: Diagnosis not present

## 2015-04-07 DIAGNOSIS — N903 Dysplasia of vulva, unspecified: Secondary | ICD-10-CM

## 2015-04-07 HISTORY — PX: VULVAR LESION REMOVAL: SHX5391

## 2015-04-07 LAB — GLUCOSE, CAPILLARY
GLUCOSE-CAPILLARY: 187 mg/dL — AB (ref 65–99)
GLUCOSE-CAPILLARY: 192 mg/dL — AB (ref 65–99)

## 2015-04-07 SURGERY — VULVAR LESION
Anesthesia: General | Wound class: Clean Contaminated

## 2015-04-07 MED ORDER — IODINE STRONG (LUGOLS) 5 % PO SOLN
ORAL | Status: AC
  Filled 2015-04-07: qty 1

## 2015-04-07 MED ORDER — FENTANYL CITRATE (PF) 100 MCG/2ML IJ SOLN
INTRAMUSCULAR | Status: DC
Start: 2015-04-07 — End: 2015-04-07
  Filled 2015-04-07: qty 2

## 2015-04-07 MED ORDER — KETAMINE HCL 10 MG/ML IJ SOLN
INTRAMUSCULAR | Status: DC | PRN
  Administered 2015-04-07: 10 mg via INTRAVENOUS

## 2015-04-07 MED ORDER — PHENYLEPHRINE HCL 10 MG/ML IJ SOLN: INTRAMUSCULAR | Status: DC | PRN

## 2015-04-07 MED ORDER — LIDOCAINE HCL (CARDIAC) 20 MG/ML IV SOLN
INTRAVENOUS | Status: DC | PRN
  Administered 2015-04-07: 80 mg via INTRAVENOUS

## 2015-04-07 MED ORDER — LIDOCAINE HCL 2 % EX GEL
1.0000 "application " | Freq: Four times a day (QID) | CUTANEOUS | Status: AC | PRN
  Filled 2015-04-07: qty 20

## 2015-04-07 MED ORDER — EPHEDRINE SULFATE 50 MG/ML IJ SOLN
INTRAMUSCULAR | Status: DC | PRN
  Administered 2015-04-07: 5 mg via INTRAVENOUS

## 2015-04-07 MED ORDER — FENTANYL CITRATE (PF) 100 MCG/2ML IJ SOLN
25.0000 ug | INTRAMUSCULAR | Status: DC | PRN
  Administered 2015-04-07 (×3): 25 ug via INTRAVENOUS

## 2015-04-07 MED ORDER — SILVER NITRATE-POT NITRATE 75-25 % EX MISC
CUTANEOUS | Status: DC | PRN
  Administered 2015-04-07: 3

## 2015-04-07 MED ORDER — ONDANSETRON HCL 4 MG/2ML IJ SOLN: 4.0000 mg | Freq: Once | INTRAMUSCULAR | Status: DC | PRN

## 2015-04-07 MED ORDER — SILVER SULFADIAZINE 1 % EX CREA
TOPICAL_CREAM | CUTANEOUS | Status: AC
  Filled 2015-04-07: qty 85

## 2015-04-07 MED ORDER — FENTANYL CITRATE (PF) 100 MCG/2ML IJ SOLN
INTRAMUSCULAR | Status: DC | PRN
  Administered 2015-04-07 (×5): 50 ug via INTRAVENOUS

## 2015-04-07 MED ORDER — ACETIC ACID 3 % SOLN
Status: DC | PRN
  Administered 2015-04-07: 30 mL via TOPICAL

## 2015-04-07 MED ORDER — DEXTROSE 5 % IV SOLN
100.0000 mg | Freq: Once | INTRAVENOUS | Status: AC
  Administered 2015-04-07: 100 mg via INTRAVENOUS
  Filled 2015-04-07: qty 100

## 2015-04-07 MED ORDER — LACTATED RINGERS IV SOLN
INTRAVENOUS | Status: DC | PRN
Start: 2015-04-07 — End: 2015-04-07

## 2015-04-07 MED ORDER — FAMOTIDINE 20 MG PO TABS
ORAL_TABLET | ORAL | Status: AC
  Administered 2015-04-07: 20 mg via ORAL
  Filled 2015-04-07: qty 1

## 2015-04-07 MED ORDER — BUPIVACAINE HCL (PF) 0.25 % IJ SOLN
INTRAMUSCULAR | Status: AC
  Filled 2015-04-07: qty 30

## 2015-04-07 MED ORDER — SILVER NITRATE-POT NITRATE 75-25 % EX MISC
CUTANEOUS | Status: AC
  Filled 2015-04-07: qty 4

## 2015-04-07 MED ORDER — DEXAMETHASONE SODIUM PHOSPHATE 4 MG/ML IJ SOLN
INTRAMUSCULAR | Status: DC | PRN
  Administered 2015-04-07: 10 mg via INTRAVENOUS

## 2015-04-07 MED ORDER — FERRIC SUBSULFATE 259 MG/GM EX SOLN
CUTANEOUS | Status: AC
  Filled 2015-04-07: qty 8

## 2015-04-07 MED ORDER — BUPIVACAINE HCL (PF) 0.25 % IJ SOLN
INTRAMUSCULAR | Status: DC | PRN
  Administered 2015-04-07: 15 mL

## 2015-04-07 MED ORDER — ONDANSETRON HCL 4 MG/2ML IJ SOLN
INTRAMUSCULAR | Status: DC | PRN
  Administered 2015-04-07: 4 mg via INTRAVENOUS

## 2015-04-07 MED ORDER — SODIUM CHLORIDE 0.9 % IV SOLN
INTRAVENOUS | Status: DC
  Administered 2015-04-07 (×2): via INTRAVENOUS

## 2015-04-07 MED ORDER — PROPOFOL 10 MG/ML IV BOLUS
INTRAVENOUS | Status: DC | PRN
  Administered 2015-04-07: 120 mg via INTRAVENOUS
  Administered 2015-04-07: 40 mg via INTRAVENOUS

## 2015-04-07 MED ORDER — ACETIC ACID 3 % SOLN
Status: AC
  Filled 2015-04-07: qty 500

## 2015-04-07 MED ORDER — HYDROMORPHONE HCL 1 MG/ML IJ SOLN: 0.2500 mg | INTRAMUSCULAR | Status: DC | PRN

## 2015-04-07 MED ORDER — MIDAZOLAM HCL 2 MG/2ML IJ SOLN
INTRAMUSCULAR | Status: DC | PRN
  Administered 2015-04-07: 2 mg via INTRAVENOUS

## 2015-04-07 MED ORDER — FAMOTIDINE 20 MG PO TABS
20.0000 mg | ORAL_TABLET | Freq: Once | ORAL | Status: AC
  Administered 2015-04-07: 20 mg via ORAL

## 2015-04-07 MED ORDER — KETOROLAC TROMETHAMINE 30 MG/ML IJ SOLN
INTRAMUSCULAR | Status: DC | PRN
  Administered 2015-04-07: 30 mg via INTRAVENOUS

## 2015-04-07 MED ORDER — OXYCODONE HCL 5 MG PO TABS: 5.0000 mg | ORAL_TABLET | ORAL | Status: DC | PRN

## 2015-04-07 SURGICAL SUPPLY — 37 items
BLADE SURG 15 STRL LF DISP TIS (BLADE) ×1 IMPLANT
BLADE SURG 15 STRL SS (BLADE) ×3
BLADE SURG SZ10 CARB STEEL (BLADE) ×3 IMPLANT
BLADE TIP J-PLASMA PRECISE LAP (MISCELLANEOUS) ×2 IMPLANT
CANISTER SUCT 1200ML W/VALVE (MISCELLANEOUS) ×3 IMPLANT
CATH ROBINSON RED A/P 16FR (CATHETERS) ×1 IMPLANT
CNTNR SPEC 2.5X3XGRAD LEK (MISCELLANEOUS) ×1
CONT SPEC 4OZ STER OR WHT (MISCELLANEOUS) ×2
CONT SPEC 4OZ STRL OR WHT (MISCELLANEOUS) ×1
CONTAINER SPEC 2.5X3XGRAD LEK (MISCELLANEOUS) ×1 IMPLANT
DRAPE PERI LITHO V/GYN (MISCELLANEOUS) ×3 IMPLANT
DRAPE UNDER BUTTOCK W/FLU (DRAPES) ×3 IMPLANT
DRSG TELFA 3X8 NADH (GAUZE/BANDAGES/DRESSINGS) ×3 IMPLANT
ELECT CAUTERY BLADE 6.4 (BLADE) ×3 IMPLANT
ELECT CAUTERY NEEDLE TIP 1.0 (MISCELLANEOUS) ×3
ELECTRODE CAUTERY NEDL TIP 1.0 (MISCELLANEOUS) ×1 IMPLANT
GLOVE BIO SURGEON STRL SZ8 (GLOVE) ×7 IMPLANT
GLOVE INDICATOR 8.0 STRL GRN (GLOVE) ×7 IMPLANT
GOWN STRL REUS W/ TWL LRG LVL3 (GOWN DISPOSABLE) ×1 IMPLANT
GOWN STRL REUS W/ TWL XL LVL3 (GOWN DISPOSABLE) ×1 IMPLANT
GOWN STRL REUS W/TWL LRG LVL3 (GOWN DISPOSABLE) ×3
GOWN STRL REUS W/TWL XL LVL3 (GOWN DISPOSABLE) ×3
NDL SAFETY 22GX1.5 (NEEDLE) ×3 IMPLANT
NS IRRIG 500ML POUR BTL (IV SOLUTION) ×3 IMPLANT
PACK BASIN MINOR ARMC (MISCELLANEOUS) ×3 IMPLANT
PAD DRESSING TELFA 3X8 NADH (GAUZE/BANDAGES/DRESSINGS) ×1 IMPLANT
PAD GROUND ADULT SPLIT (MISCELLANEOUS) ×3 IMPLANT
PAD OB MATERNITY 4.3X12.25 (PERSONAL CARE ITEMS) ×3 IMPLANT
PAD PREP 24X41 OB/GYN DISP (PERSONAL CARE ITEMS) ×3 IMPLANT
SOL PREP PVP 2OZ (MISCELLANEOUS) ×3
SOLUTION PREP PVP 2OZ (MISCELLANEOUS) ×1 IMPLANT
SPONGE XRAY 4X4 16PLY STRL (MISCELLANEOUS) ×5 IMPLANT
SUT VIC AB 2-0 SH 27 (SUTURE) ×3
SUT VIC AB 2-0 SH 27XBRD (SUTURE) ×1 IMPLANT
SUT VIC AB 3-0 SH 27 (SUTURE) ×3
SUT VIC AB 3-0 SH 27X BRD (SUTURE) ×1 IMPLANT
SYR CONTROL 10ML (SYRINGE) ×3 IMPLANT

## 2015-04-07 NOTE — Transfer of Care (Signed)
Immediate Anesthesia Transfer of Care Note  Patient: Marcia [name]  Procedure(s) Performed: Procedure(s) with comments: VULVAR LESION (N/A) - with J-Plasma blade  Patient Location: PACU  Anesthesia Type:General  Level of Consciousness: awake, alert  and oriented  Airway & Oxygen Therapy: Patient Spontanous Breathing and Patient connected to nasal cannula oxygen  Post-op Assessment: Report given to RN and Post -op Vital signs reviewed and stable  Post vital signs: Reviewed and stable  Last Vitals:  Filed Vitals:   04/07/15 1050  BP: 124/79  Pulse: 81  Temp: 36.8 C  Resp: 18    Complications: No apparent anesthesia complications

## 2015-04-07 NOTE — Op Note (Signed)
   Operative Note  04/07/2015 12:25 PM  PRE-OP DIAGNOSIS: VIN    POST-OP DIAGNOSIS: VIN, possible AIN   SURGEON: Surgeon(s) and Role:  * Angeles Gaetana Michaelis, MD - Primary  ANESTHESIA: Choice   PROCEDURE: Procedure(s): ABLATION OF VULVAR LESIONS AND PERIANAL BIOPSY   ESTIMATED BLOOD LOSS: less than 50 mL  DRAINS: NONE   SPECIMENS: NONE   COMPLICATIONS: NONE   DISPOSITION: PACU - hemodynamically stable.  CONDITION: stable  INDICATIONS: Symptomatic recurrent VIN. The patient emptied her bladder prior to going to the OR.  FINDINGS: Exam under anesthesia after application of acetic acid revealed acetowhite epithelium involving bilateral labia minora of the vulva each approximately 3 cm. On the left the abnormalities extending to the medial aspect of the labia minora. There was a another patch of acetowhite on right vulva at 3 o'clock as well as another area at the perineum. There were scattered small 1-2 mm areas of acetowhite extending toward the anus.   PROCEDURE IN DETAIL: After informed consent was obtained, the patient was taken to the operating room where anesthesia was obtained without difficulty. The patient was positioned in the dorsal lithotomy position in Candycane stirrups. Time-out was performed. The patient was examined under anesthesia, and the above findings were noted. She was prepped with 1/2 Hibiclens and 1/2 NS. The patient was draped using sterile blue towels. Ablation of the vulva was performed using the J-plasma device to the second dermal plane and all visible vulvar lesions were ablated. Biopsy was performed at 11 0'clock of the perianal skin. Hemostasis was obtained with cauterization and AgNO3. The patient tolerated the procedure well. Sponge, lap and needle counts were correct x2. The patient was taken to recovery room in excellent condition.  Antibiotics: Alternative antibiotic to cephalosporin given as indicated by scheduled procedure, Antibiotics  given within 1 hour of the start of the procedure, Antibiotics ordered to be discontinued within 24 hours post procedure   Wound: clean    VTE prophylaxis: was ordered perioperatively with SCDs.    Gillis Ends, MD

## 2015-04-07 NOTE — Discharge Instructions (Addendum)
[name] has an appointment with Gyn Oncology at [facility] on November 2nd 2016. Either Dr. Fransisca Connors or Dr. Theora Gianotti will see her that day.    Vulvar ablation, Care After Refer to this sheet in the next few weeks. These instructions provide you with information on caring for yourself after your procedure. Your health care provider may also give you more specific instructions. Your treatment has been planned according to current medical practices, but problems sometimes occur. Call your health care provider if you have any problems or questions after your procedure.   WHAT TO EXPECT AFTER THE PROCEDURE After your procedure, it is typical to have the following:  Pelvic and vulvar discomfort and pain  Feeling tired.  Minimal bleeding from the wound  HOME CARE INSTRUCTIONS  It takes 4-6 weeks to recover from this surgery. Make sure you follow all your health care provider's instructions. Home care instructions may include:  Take over the counter pain medicines as needed.   Take ibuprofen, tylenol, and oxycodone as prescribed for pain  Take sitz bath two twice a day, clean wound, rinse, blot and blow dry and also perform after every bowel movement.   You may also take showers in addition to sitz baths.   Do not douche, use tampons, or have sexual intercourse for at least 4 weeks or until your health care provider says you can.   If you are having difficulty urinating you can try urinating in the bath tub or shower, or use spray bottle to dilute urine and this will decrease pain.   Apply SSD cream or xylocaine gel to area as need for pain. You also try dermaplast, over the counter spray, for pain.   You can resume your normal diet     If you are constipated, you can take a mild laxative.  Eating foods high in fiber may also help with constipation. Eat plenty of raw fruits and vegetables, whole grains, and beans.  Drink enough fluids to keep your urine clear or pale  yellow.   Try to have someone at home with you for the first week to help around the house.  Keep all follow-up appointments.   SEEK MEDICAL CARE IF:   You have chills or fever.  You feel dizzy or light-headed.   You have pain or bleeding when you urinate.   You have persistent nausea and vomiting.    You you have heavy bleeding  Your pain medicine is not helping.   SEEK IMMEDIATE MEDICAL CARE IF:   You have a fever and your symptoms suddenly get worse.  You have severe abdominal pain.  You have chest pain.  You have shortness of breath.  You faint.  You have pain, swelling, or redness of your leg.  You have very heavy vaginal or vulvar bleeding with blood clots. MAKE SURE YOU:  Understand these instructions.  Will watch your condition. Will get help right away if you are not doing well or get worse.AMBULATORY SURGERY  DISCHARGE INSTRUCTIONS   The drugs that you were given will stay in your system until tomorrow so for the next 24 hours you should not:  Drive an automobile Make any legal decisions Drink any alcoholic beverage   You may resume regular meals tomorrow.  Today it is better to start with liquids and gradually work up to solid foods.  You may eat anything you prefer, but it is better to start with liquids, then soup and crackers, and gradually work up to solid  foods.   Please notify your doctor immediately if you have any unusual bleeding, trouble breathing, redness and pain at the surgery site, drainage, fever, or pain not relieved by medication.    Additional Instructions:        Please contact your physician with any problems or Same Day Surgery at [phone], Monday through Friday 6 am to 4 pm, or [place] at [facility] number at [phone].AMBULATORY SURGERY  DISCHARGE INSTRUCTIONS   The drugs that you were given will stay in your system until tomorrow so for the next 24 hours you should not:  Drive an  automobile Make any legal decisions Drink any alcoholic beverage   You may resume regular meals tomorrow.  Today it is better to start with liquids and gradually work up to solid foods.  You may eat anything you prefer, but it is better to start with liquids, then soup and crackers, and gradually work up to solid foods.   Please notify your doctor immediately if you have any unusual bleeding, trouble breathing, redness and pain at the surgery site, drainage, fever, or pain not relieved by medication.    Additional Instructions:         Please contact your physician with any problems or Same Day Surgery at [phone], Monday through Friday 6 am to 4 pm, or [place] at [facility] number at [phone].

## 2015-04-07 NOTE — Interval H&P Note (Signed)
History and Physical Interval Note:  04/07/2015 10:49 AM  Marcia [name]  has presented today for surgery, with the diagnosis of VIN  The various methods of treatment have been discussed with the patient and family. After consideration of risks, benefits and other options for treatment, the patient has consented to  Procedure(s): VULVAR ABLATION LASER or J-PLASMA as a surgical intervention .  The patient's history has been reviewed, patient examined, no change in status, stable for surgery. She had a cardiology evaluation and has been cleared for surgery.  I have reviewed the patient's chart and labs.  Questions were answered to the patient's satisfaction.     Tressie Ragin ALVAREZ

## 2015-04-07 NOTE — Anesthesia Procedure Notes (Signed)
Procedure Name: LMA Insertion Performed by: Jenetta Downer Pre-anesthesia Checklist: Patient identified, Emergency Drugs available, Suction available, Patient being monitored and Timeout performed Patient Re-evaluated:Patient Re-evaluated prior to inductionOxygen Delivery Method: Circle system utilized Preoxygenation: Pre-oxygenation with 100% oxygen Intubation Type: IV induction Ventilation: Mask ventilation without difficulty LMA Size: 4.0 Number of attempts: 1

## 2015-04-07 NOTE — Progress Notes (Signed)
Noted st elevation and blood sugar 192  Dr rice in to see pt no new orders

## 2015-04-07 NOTE — H&P (View-Only) (Signed)
Gynecologic Oncology Interval Note  Chief Concern: Vulvar dysplasia surveillence. VIN III  Subjective:  Marcia [name] Fraction is a 44 y.o. woman who presents today for continued surveillance for history of vulvar dysplasia VINII.   She has vulvar itching being treated with OTC steroid cream. Crusted area on mons.    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 1/14     laser vaporization of raised epithelium consistent with VIN, mainly on the anterior portion of the labia minora. A few other scattered areas around the external genitalia.   Prior hysterectomy  GYN History:   Gravida 3    Para 2    Age at Menarche 13    Regular Pap Smears Yes    History of HPV Infection    Cycle Normal  LMP 08-23-2009    Additional Hx no other gyn problems in the past   Problem List: Patient Active Problem List   Diagnosis Date Noted  . 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 Staphylococcal infection of skin 10/13/2014  . Furuncle of vulva 10/13/2014  . 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 Encounter for preventive health examination 09/12/2014  . Onychomycosis of toenail 09/10/2014  . Type 1 diabetes mellitus with neurological manifestations, uncontrolled 02/10/2014  . Cough 02/05/2014  . Chronic bronchitis 11/27/2013  . Unspecified constipation 11/11/2013  . Insomnia secondary to anxiety 07/09/2013  . History of hyperthyroidism 07/01/2013  . Snoring disorder 12/26/2012  . Tobacco abuse 05/22/2011  . Tobacco abuse counseling 05/22/2011  . ABNORMAL EKG 09/10/2009    Past Medical History: Past Medical History  Diagnosis Date  . Vulvar  lesion   . Myocardial infarction 1997    At age 44  . Diabetes mellitus   . History of HPV infection   . Heart murmur   . Hay fever     Allergies  . Vaginal cancer   . Thyroid disease   . Hypercholesterolemia   . Hypothyroidism   . Tobacco abuse     Past Surgical History: Past Surgical History  Procedure Laterality Date  . Ankle surgery  2010  . Tubal ligation  2004  . Tubal ligation  2004, [facility]  . Abdominal hysterectomy  10/12/12    Family History: Family History  Problem Relation Age of Onset  . Cancer Other     Aunt-breast; grandmother- lung and ovarian  . Diabetes Father   . Hypertension Mother   . Hyperlipidemia Mother   . Aneurysm Brother     Social History: History   Social History  . Marital Status: Divorced    Spouse Name: N/A  . Number of Children: N/A  . Years of Education: N/A   Occupational History  . Not on file.   Social History Main Topics  . Smoking status: Current Every Day Smoker -- 1.00 packs/day for 22 years    Types: Cigarettes  . Smokeless tobacco: Never Used     Comment: Down to .5ppd, trying to quit.  Allergic  to chantix and e-cigs 02/05/14  . Alcohol Use: No  . Drug Use: No  . Sexual Activity: Yes   Other Topics Concern  . Not on file   Social History Narrative   No regular exercise.   Lives with spouse.    Allergies: Allergies  Allergen Reactions  . Chantix [Varenicline] Rash  . Septra [Sulfamethoxazole-Trimethoprim] Rash    Current Medications: Current Outpatient Prescriptions  Medication Sig Dispense Refill  . albuterol (PROVENTIL HFA;VENTOLIN HFA) 108 (90 BASE) MCG/ACT inhaler Inhale 2 puffs into the lungs every 6 (six) hours as needed for wheezing or shortness of breath. 1 Inhaler 11  . aspirin 81 MG EC tablet TAKE 1 TABLET BY MOUTH EVERY DAY 30 tablet 11  . Blood Glucose Monitoring Suppl (ACCU-CHEK AVIVA PLUS) W/DEVICE KIT 1 Device by Does not apply route once. 1 kit 0  . citalopram (CELEXA) 10 MG tablet TAKE 1  TABLET BY MOUTH EVERY DAY 30 tablet 2  . estradiol (VIVELLE-DOT) 0.05 MG/24HR patch Place 1 patch onto the skin once a week.    . Fluticasone-Salmeterol (ADVAIR DISKUS) 250-50 MCG/DOSE AEPB Inhale 1 puff into the lungs 2 (two) times daily. 60 each 6  . glucose blood (ACCU-CHEK AVIVA) test strip 1 each by Other route 2 (two) times daily. And lancets 2/day 100 each 12  . insulin aspart (NOVOLOG FLEXPEN) 100 UNIT/ML FlexPen 4 units with breakfast, and 4 units with the evening meal. 15 mL 11  . Insulin Glargine (LANTUS SOLOSTAR) 100 UNIT/ML Solostar Pen Inject 27 Units into the skin every morning. 5 pen PRN  . levothyroxine (SYNTHROID, LEVOTHROID) 112 MCG tablet TAKE 1 TABLET (112 MCG TOTAL) BY MOUTH DAILY. 30 tablet 1  . metoprolol tartrate (LOPRESSOR) 25 MG tablet Take 1 tablet (25 mg total) by mouth 2 (two) times daily. 180 tablet 3  . simvastatin (ZOCOR) 40 MG tablet Take 40 mg by mouth every evening.    Marland Kitchen Spacer/Aero-Holding Josiah Lobo KIT For use with [facility] and pro air [facility] Pharmacy please substitute the appropriate spacer needed 1 each 1  . Tiotropium Bromide Monohydrate (SPIRIVA RESPIMAT) 2.5 MCG/ACT AERS INHALE 2 PUFFS INTO THE LUNGS DAILY. 4 g 5  . losartan (COZAAR) 100 MG tablet Take 1 tablet (100 mg total) by mouth daily. 30 tablet 11   No current facility-administered medications for this visit.    Review of Systems Pertinent items are noted in HPI.  Objective:  Physical Examination:  BP 132/77 mmHg  Pulse 79  Temp(Src) 98.5 F (36.9 C) (Oral)  Resp 18  Ht _0  (1.575 m)  Wt 137 lb 5.6 oz (62.3 kg)  BMI 25.11 kg/m2  LMP 10/12/2012  ECOG Performance Status: 1 - Symptomatic but completely ambulatory  General appearance: alert, cooperative and appears stated age HEENT:PERRLA, neck supple with midline trachea and thyroid without masses Lymph node survey: non-palpable, axillary, inguinal, supraclavicular Cardiovascular: regular rate and rhythm, no murmurs or  gallops Respiratory: normal air entry, lungs clear to auscultation Abdomen: soft, non-tender, without masses or organomegaly, no hernias and well healed incision Back: inspection of back is normal Extremities: extremities normal, atraumatic, no cyanosis or edema Skin exam - normal coloration and turgor, no rashes, no suspicious skin lesions noted. Neurological exam reveals alert, oriented, normal speech, no focal findings or movement disorder noted.  Pelvic: exam chaperoned by nurse;  Vulva: vulva with reddish area on upper left labia; Hair follicle infection healing on mons. Vagina: normal; Adnexa: normal adnexa in size, nontender and no masses; Uterus: absent;  Cervix: absent; Rectal: no masses  Procedure note: Informed consent obtained and time out performed.  Colposcopy of the vulva showed about a 3 cm area of acetowhite epithelium on both sides of the upper labia minora.  Also scattered small areas on the right labia lower down.  Betadine prep done and 1 ml of 2% lidocaine was injected in the left labia.  Small skin biopsy taken with cervical biopsy forceps.  Cauterized with silver nitrate.  Assessment:  Marcia [name] is a 44 y.o. female with a history of vulvar dysplasia VIN III last lasered in 1/14. Evidence of recurrent dysplasia on exam today.  Smokes half a pack a day.   Plan:   Problem List Items Addressed This Visit      Musculoskeletal and Integument   VIN (vulval intraepithelial neoplasia) II - Primary     Biopsy done and suggested repeat laser ablation in one week.  She is comfortable with the plan.   Discussed importance of tobacco cessation in controlling VIN in the future.  Mellody Drown, MD  CC:  Crecencio Mc, MD [address], [place] [address] [phone]

## 2015-04-07 NOTE — Anesthesia Preprocedure Evaluation (Signed)
Anesthesia Evaluation  Patient identified by MRN, date of birth, ID band Patient awake    Reviewed: Allergy & Precautions, NPO status , Patient's Chart, lab work & pertinent test results  Airway Mallampati: I  TM Distance: >3 FB Neck ROM: Full    Dental  (+) Teeth Intact   Pulmonary Current Smoker,    Pulmonary exam normal       Cardiovascular Exercise Tolerance: Good + Past MI Normal cardiovascular exam MI in 1997, has done well since.   Neuro/Psych    GI/Hepatic   Endo/Other  diabetes, Type 1  Renal/GU      Musculoskeletal   Abdominal (+)  Abdomen: soft.    Peds  Hematology   Anesthesia Other Findings   Reproductive/Obstetrics                             Anesthesia Physical Anesthesia Plan  ASA: III  Anesthesia Plan: General   Post-op Pain Management:    Induction: Intravenous  Airway Management Planned: LMA  Additional Equipment:   Intra-op Plan:   Post-operative Plan: Extubation in OR  Informed Consent: I have reviewed the patients History and Physical, chart, labs and discussed the procedure including the risks, benefits and alternatives for the proposed anesthesia with the patient or authorized representative who has indicated his/her understanding and acceptance.     Plan Discussed with: CRNA  Anesthesia Plan Comments:         Anesthesia Quick Evaluation

## 2015-04-07 NOTE — Anesthesia Postprocedure Evaluation (Addendum)
  Anesthesia Post-op Note  Patient: Marcia [name]  Procedure(s) Performed: Procedure(s) with comments: VULVAR LESION (N/A) - with J-Plasma blade  Anesthesia type:General  Patient location: PACU  Post pain: Pain level controlled  Post assessment: Post-op Vital signs reviewed, Patient's Cardiovascular Status Stable, Respiratory Function Stable, Patent Airway and No signs of Nausea or vomiting  Post vital signs: Reviewed and stable  Last Vitals:  Filed Vitals:   04/07/15 1050  BP: 124/79  Pulse: 81  Temp: 36.8 C  Resp: 18    Level of consciousness: awake, alert  and patient cooperative  Complications: No apparent anesthesia complications

## 2015-04-09 ENCOUNTER — Encounter: Payer: Self-pay | Admitting: Obstetrics and Gynecology

## 2015-04-09 LAB — SURGICAL PATHOLOGY

## 2015-04-14 ENCOUNTER — Telehealth: Payer: Self-pay | Admitting: *Deleted

## 2015-04-14 ENCOUNTER — Other Ambulatory Visit: Payer: Self-pay | Admitting: *Deleted

## 2015-04-14 DIAGNOSIS — N901 Moderate vulvar dysplasia: Secondary | ICD-10-CM

## 2015-04-14 MED ORDER — SILVER SULFADIAZINE 1 % EX CREA: 1.0000 "application " | TOPICAL_CREAM | Freq: Every day | CUTANEOUS | Status: DC

## 2015-04-14 MED ORDER — OXYCODONE HCL 5 MG PO TABS: 5.0000 mg | ORAL_TABLET | ORAL | Status: DC | PRN

## 2015-04-14 NOTE — Telephone Encounter (Signed)
rx sent in for silvadine cream per v/o Dr. Fransisca Connors

## 2015-04-14 NOTE — Telephone Encounter (Signed)
Requesting stronger pain med States what she has is not helping

## 2015-04-14 NOTE — Telephone Encounter (Signed)
Spoke with Dr. Fransisca Connors patient can increase oxycodone 5 mg. 1 to 2 tablets every 4 to 6 hours as needed for pain. Patient was only dispensed 10 tablets last week. Patient states that she has only been taking 1 tablet a day for the oxycodone. Dr. Fransisca Connors has refilled this RX and patient can come pick up this RX. The patient states that she never received an RX for topical lidocaine or silvadine cream last week.

## 2015-04-14 NOTE — Telephone Encounter (Signed)
Per H jones find out if pt is using silvadene cream and to use Ibuprofen until she can speak with Dr Fransisca Connors after 11 today. Patient reports that she did not get rx for silvadene cream

## 2015-05-10 ENCOUNTER — Other Ambulatory Visit: Payer: Self-pay | Admitting: Endocrinology

## 2015-05-31 ENCOUNTER — Telehealth: Payer: Self-pay | Admitting: *Deleted

## 2015-05-31 NOTE — Telephone Encounter (Signed)
Per email from [employer] form Dr Theora Gianotti, pt to go to ER for evaluation if she is having that much pain. She was informed of this and said she would go to ER

## 2015-06-07 ENCOUNTER — Other Ambulatory Visit: Payer: Self-pay | Admitting: Endocrinology

## 2015-06-09 ENCOUNTER — Inpatient Hospital Stay: Payer: Medicaid Other | Attending: Obstetrics and Gynecology | Admitting: Obstetrics and Gynecology

## 2015-06-09 VITALS — BP 116/71 | HR 73 | Temp 98.0°F | Ht 62.0 in | Wt 132.3 lb

## 2015-06-09 DIAGNOSIS — N901 Moderate vulvar dysplasia: Secondary | ICD-10-CM

## 2015-06-09 NOTE — Progress Notes (Signed)
Assisted MD with vaginal cauterization with silver nitrate.

## 2015-06-09 NOTE — Progress Notes (Signed)
Gynecologic Oncology Interval Note  Chief Concern: Vulvar dysplasia. VIN III for post op check.  Subjective:  Marcia [name] is a 44 y.o. woman who presents today for post op visit after laser ablation of vulvar dysplasia VINII on 8/31.   04/07/15:  Had laser ablation in OR with Dr Theora Gianotti.  "Acetowhite epithelium involving bilateral labia minora of the vulva each approximately 3 cm. On the left the abnormalities extending to the medial aspect of the labia minora. There was a another patch of acetowhite on right vulva at 3 o'clock as well as another area at the perineum. There were scattered small 1-2 mm areas of acetowhite extending toward the anus."   Has some vulvar itching and this has been treated in past with OTC steroid cream. She is still using Silvadene cream.  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 1/14     laser vaporization of raised epithelium consistent with VIN, mainly on the anterior portion of the labia minora. A few other scattered areas around the external genitalia. 8/16         Laser ablation of vulva by Dr Theora Gianotti  Prior hysterectomy  GYN History:   Gravida 3    Para 2    Age at Menarche 58    Regular Pap Smears Yes    History of HPV Infection    Cycle Normal  LMP 08-23-2009    Additional Hx no other gyn problems in the past   Problem List: Patient Active Problem List   Diagnosis Date Noted  . 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 Staphylococcal infection of skin 10/13/2014  . Furuncle of vulva 10/13/2014  . Hyperlipidemia due to type 1 diabetes mellitus ([place]) 09/12/2014  . Encounter for preventive health examination  09/12/2014  . Onychomycosis of toenail 09/10/2014  . Type 1 diabetes mellitus with neurological manifestations, uncontrolled ([place]) 02/10/2014  . Cough 02/05/2014  . Chronic bronchitis ([place]) 11/27/2013  . Unspecified constipation 11/11/2013  . Insomnia secondary to anxiety 07/09/2013  . History of hyperthyroidism 07/01/2013  . Snoring disorder 12/26/2012  . Tobacco abuse 05/22/2011  . Tobacco abuse counseling 05/22/2011  . ABNORMAL EKG 09/10/2009    Past Medical History: Past Medical History  Diagnosis Date  . Vulvar lesion   . Diabetes mellitus   . History of HPV infection   . Heart murmur   . Hay fever     Allergies  . Vaginal cancer   . Thyroid disease   . Hypercholesterolemia   . Hypothyroidism   . Tobacco abuse   . Myocardial infarction ([place]) 1997    At age 44    Past Surgical History: Past Surgical History  Procedure Laterality Date  . Ankle surgery  2010  . Tubal ligation  2004  . Tubal ligation  2004, [facility]  . Abdominal hysterectomy  10/12/12  . Vulvar lesion removal  10/2009 & 05/2012  . Vulvar lesion removal N/A 04/07/2015    Procedure: VULVAR LESION;  Surgeon: Gillis Ends,  MD;  Location: [facility] ORS;  Service: Gynecology;  Laterality: N/A;  with J-Plasma blade    Family History: Family History  Problem Relation Age of Onset  . Cancer Other     Aunt-breast; grandmother- lung and ovarian  . Diabetes Father   . Hypertension Mother   . Hyperlipidemia Mother   . Aneurysm Brother     Social History: Social History   Social History  . Marital Status: Single    Spouse Name: N/A  . Number of Children: N/A  . Years of Education: N/A   Occupational History  . Not on file.   Social History Main Topics  . Smoking status: Current Every Day Smoker -- 0.50 packs/day for 22 years    Types: Cigarettes  . Smokeless tobacco: Never Used     Comment: Down to .5ppd, trying to quit.  Allergic to chantix and e-cigs 02/05/14  . Alcohol Use: No  . Drug Use: No   . Sexual Activity: Yes   Other Topics Concern  . Not on file   Social History Narrative   No regular exercise.   Lives with spouse.    Allergies: Allergies  Allergen Reactions  . Chantix [Varenicline] Rash  . Septra [Sulfamethoxazole-Trimethoprim] Rash    Current Medications: Current Outpatient Prescriptions  Medication Sig Dispense Refill  . ACCU-CHEK AVIVA PLUS test strip TEST 2 TIMES A DAY 100 each 6  . albuterol (PROVENTIL HFA;VENTOLIN HFA) 108 (90 BASE) MCG/ACT inhaler Inhale 2 puffs into the lungs every 6 (six) hours as needed for wheezing or shortness of breath. 1 Inhaler 11  . aspirin 81 MG EC tablet TAKE 1 TABLET BY MOUTH EVERY DAY 30 tablet 11  . B-D ULTRAFINE III SHORT PEN 31G X 8 MM MISC USE TWO NEEDLES DAILY 100 each 8  . Blood Glucose Monitoring Suppl (ACCU-CHEK AVIVA PLUS) W/DEVICE KIT 1 Device by Does not apply route once. 1 kit 0  . canagliflozin (INVOKANA) 300 MG TABS tablet Take 300 mg by mouth daily before breakfast.    . citalopram (CELEXA) 10 MG tablet TAKE 1 TABLET BY MOUTH EVERY DAY 30 tablet 2  . estradiol (VIVELLE-DOT) 0.05 MG/24HR patch Place 1 patch onto the skin once a week. Apply on Mondays    . Fluticasone-Salmeterol (ADVAIR DISKUS) 250-50 MCG/DOSE AEPB Inhale 1 puff into the lungs 2 (two) times daily. 60 each 6  . insulin aspart (NOVOLOG FLEXPEN) 100 UNIT/ML FlexPen 4 units with breakfast, and 4 units with the evening meal. 15 mL 11  . Insulin Glargine (LANTUS SOLOSTAR) 100 UNIT/ML Solostar Pen Inject 27 Units into the skin every morning. 5 pen PRN  . levothyroxine (SYNTHROID, LEVOTHROID) 112 MCG tablet TAKE 1 TABLET (112 MCG TOTAL) BY MOUTH DAILY. 30 tablet 1  . metoprolol tartrate (LOPRESSOR) 25 MG tablet Take 1 tablet (25 mg total) by mouth 2 (two) times daily. (Patient taking differently: Take 25 mg by mouth once. ) 180 tablet 3  . omeprazole (PRILOSEC) 40 MG capsule Take 40 mg by mouth daily.    . silver sulfADIAZINE (SILVADENE) 1 % cream Apply  1 application topically daily. 50 g 0  . simvastatin (ZOCOR) 40 MG tablet Take 40 mg by mouth every evening.    Marland Kitchen Spacer/Aero-Holding Josiah Lobo KIT For use with [facility] and pro air MDIs Pharmacy please substitute the appropriate spacer needed 1 each 1  . Tiotropium Bromide Monohydrate (SPIRIVA RESPIMAT) 2.5 MCG/ACT AERS INHALE 2 PUFFS INTO THE LUNGS DAILY. 4 g 5   No current facility-administered  medications for this visit.   Facility-Administered Medications Ordered in Other Visits  Medication Dose Route Frequency Provider Last Rate Last Dose  . lidocaine (XYLOCAINE) 2 % jelly 1 application  1 application Topical QID PRN Angeles Gaetana Michaelis, MD        Review of Systems Pertinent items are noted in HPI.  Objective:  Physical Examination:  BP 116/71 mmHg  Pulse 73  Temp(Src) 98 F (36.7 C) (Oral)  Ht _0  (1.575 m)  Wt 132 lb 4.4 oz (60 kg)  BMI 24.19 kg/m2  LMP 10/12/2012  ECOG Performance Status: 1 - Symptomatic but completely ambulatory  General appearance: alert, cooperative and appears stated age HEENT:PERRLA, neck supple with midline trachea and thyroid without masses Lymph node survey: non-palpable, axillary, inguinal, supraclavicular Cardiovascular: regular rate and rhythm, no murmurs or gallops Respiratory: normal air entry, lungs clear to auscultation Abdomen: soft, non-tender, without masses or organomegaly, no hernias and well healed incision Back: inspection of back is normal Extremities: extremities normal, atraumatic, no cyanosis or edema Skin exam - normal coloration and turgor, no rashes, no suspicious skin lesions noted. Neurological exam reveals alert, oriented, normal speech, no focal findings or movement disorder noted.  Pelvic: exam chaperoned by nurse;  Vulva: well healed except two small areas of granulation tissue on labia and these were cauterized with silver nitrate.  Vagina: normal; Adnexa: normal adnexa in size, nontender and no masses; Uterus:  absent; Cervix: absent; Rectal: no masses    Assessment:  Marcia [name] is a 44 y.o. female with a history of vulvar dysplasia VINIII s/p laser 8/31 with good healing except for 2 small areas that were cauterized with silver nitrate.  Smokes half a pack a day.   Plan:   Problem List Items Addressed This Visit      Musculoskeletal and Integument   VIN (vulval intraepithelial neoplasia) II - Primary      She will RTC in 1 year for follow up or sooner if new lesions.  Will also follow up with her primary care doctor. Discussed importance of tobacco cessation in controlling VIN in the future.She will use Monistat cream instead of Silvadene, which she will stop now, if itching continues.   Mellody Drown, MD  CC:  Crecencio Mc, MD [address] [place], [place] [address] [phone]

## 2015-07-02 ENCOUNTER — Other Ambulatory Visit: Payer: Self-pay | Admitting: Internal Medicine

## 2015-07-02 ENCOUNTER — Other Ambulatory Visit: Payer: Self-pay | Admitting: Endocrinology

## 2015-07-05 ENCOUNTER — Other Ambulatory Visit: Payer: Self-pay | Admitting: Internal Medicine

## 2015-11-09 ENCOUNTER — Other Ambulatory Visit: Payer: Self-pay | Admitting: Nurse Practitioner

## 2015-11-09 DIAGNOSIS — Z1231 Encounter for screening mammogram for malignant neoplasm of breast: Secondary | ICD-10-CM

## 2015-11-23 ENCOUNTER — Ambulatory Visit
Admission: RE | Admit: 2015-11-23 | Discharge: 2015-11-23 | Disposition: A | Discharge status: Home / Self Care | Payer: BLUE CROSS/BLUE SHIELD | Source: Ambulatory Visit | Attending: Nurse Practitioner | Admitting: Nurse Practitioner

## 2015-11-23 DIAGNOSIS — Z1231 Encounter for screening mammogram for malignant neoplasm of breast: Secondary | ICD-10-CM | POA: Insufficient documentation

## 2015-12-09 ENCOUNTER — Ambulatory Visit (INDEPENDENT_AMBULATORY_CARE_PROVIDER_SITE_OTHER): Payer: BLUE CROSS/BLUE SHIELD | Admitting: Podiatry

## 2015-12-09 ENCOUNTER — Encounter: Payer: Self-pay | Admitting: Podiatry

## 2015-12-09 VITALS — BP 104/65 | HR 85 | Resp 18

## 2015-12-09 DIAGNOSIS — B351 Tinea unguium: Secondary | ICD-10-CM | POA: Diagnosis not present

## 2015-12-09 DIAGNOSIS — M79676 Pain in unspecified toe(s): Secondary | ICD-10-CM

## 2015-12-09 DIAGNOSIS — L600 Ingrowing nail: Secondary | ICD-10-CM

## 2015-12-09 NOTE — Progress Notes (Signed)
   Subjective:    Patient ID: Marcia [name], female    DOB: 20-Feb-1971, 45 y.o.   MRN: [id number]  HPI  45 year old female presents to the office today for concerns of toenail Thickening discoloration. She denies any pain with the ingrown portion the toenails her she gets some pain due to the thickness of her toenails rubbing in shoes. Denies any redness or drainage. No recent treatment. Last A1c was 7.9. Denies any numbness or tingling. No swelling. No claudication symptoms. No other complaint.   Review of Systems  All other systems reviewed and are negative.      Objective:   Physical Exam General: AAO x3, NAD  Dermatological: Bilateral hallux nails are incurvated in both the medial and lateral nail borders. The lateral hallux and fifth digits or nails are hypertrophic, dystrophic, brittle, discolored, elongated. There is tenderness to nails one and 5 bilaterally. No open lesions.  Vascular: Dorsalis Pedis artery and Posterior Tibial artery pedal pulses are 2/4 bilateral with immedate capillary fill time. Pedal hair growth present. No varicosities and no lower extremity edema present bilateral. There is no pain with calf compression, swelling, warmth, erythema.   Neruologic: Grossly intact via light touch bilateral. Vibratory intact via tuning fork bilateral. Protective threshold with Semmes Wienstein monofilament intact to all pedal sites bilateral. Patellar and Achilles deep tendon reflexes 2+ bilateral. No Babinski or clonus noted bilateral.   Musculoskeletal: No gross boney pedal deformities bilateral. No pain, crepitus, or limitation noted with foot and ankle range of motion bilateral. Muscular strength 5/5 in all groups tested bilateral.  Gait: Unassisted, Nonantalgic.      Assessment & Plan:  45 year old female symptomatic onychomycosis 4 with ingrown toenail -Treatment options discussed including all alternatives, risks, and complications -Etiology of symptoms were  discussed -Nails debrided  X 4 without complications or bleeding -Discussed treatment options for nail fungus and ingrown toenail. Compound cream ordered.  -Follow-up 3 months or sooner if any problems arise. In the meantime, encouraged to call the office with any questions, concerns, change in symptoms.   Celesta Gentile, DPM

## 2015-12-09 NOTE — Patient Instructions (Signed)
Diabetes and Foot Care Diabetes may cause you to have problems because of poor blood supply (circulation) to your feet and legs. This may cause the skin on your feet to become thinner, break easier, and heal more slowly. Your skin may become dry, and the skin may peel and crack. You may also have nerve damage in your legs and feet causing decreased feeling in them. You may not notice minor injuries to your feet that could lead to infections or more serious problems. Taking care of your feet is one of the most important things you can do for yourself.  HOME CARE INSTRUCTIONS  Wear shoes at all times, even in the house. Do not go barefoot. Bare feet are easily injured.  Check your feet daily for blisters, cuts, and redness. If you cannot see the bottom of your feet, use a mirror or ask someone for help.  Wash your feet with warm water (do not use hot water) and mild soap. Then pat your feet and the areas between your toes until they are completely dry. Do not soak your feet as this can dry your skin.  Apply a moisturizing lotion or petroleum jelly (that does not contain alcohol and is unscented) to the skin on your feet and to dry, brittle toenails. Do not apply lotion between your toes.  Trim your toenails straight across. Do not dig under them or around the cuticle. File the edges of your nails with an emery board or nail file.  Do not cut corns or calluses or try to remove them with medicine.  Wear clean socks or stockings every day. Make sure they are not too tight. Do not wear knee-high stockings since they may decrease blood flow to your legs.  Wear shoes that fit properly and have enough cushioning. To break in new shoes, wear them for just a few hours a day. This prevents you from injuring your feet. Always look in your shoes before you put them on to be sure there are no objects inside.  Do not cross your legs. This may decrease the blood flow to your feet.  If you find a minor scrape,  cut, or break in the skin on your feet, keep it and the skin around it clean and dry. These areas may be cleansed with mild soap and water. Do not cleanse the area with peroxide, alcohol, or iodine.  When you remove an adhesive bandage, be sure not to damage the skin around it.  If you have a wound, look at it several times a day to make sure it is healing.  Do not use heating pads or hot water bottles. They may burn your skin. If you have lost feeling in your feet or legs, you may not know it is happening until it is too late.  Make sure your health care provider performs a complete foot exam at least annually or more often if you have foot problems. Report any cuts, sores, or bruises to your health care provider immediately. SEEK MEDICAL CARE IF:   You have an injury that is not healing.  You have cuts or breaks in the skin.  You have an ingrown nail.  You notice redness on your legs or feet.  You feel burning or tingling in your legs or feet.  You have pain or cramps in your legs and feet.  Your legs or feet are numb.  Your feet always feel cold. SEEK IMMEDIATE MEDICAL CARE IF:   There is increasing redness,   swelling, or pain in or around a wound.  There is a red line that goes up your leg.  Pus is coming from a wound.  You develop a fever or as directed by your health care provider.  You notice a bad smell coming from an ulcer or wound.   This information is not intended to replace advice given to you by your health care provider. Make sure you discuss any questions you have with your health care provider.   Document Released: 07/21/2000 Document Revised: 03/26/2013 Document Reviewed: 12/31/2012 Elsevier Interactive Patient Education 2016 [employer] Inc[employer]  

## 2015-12-28 ENCOUNTER — Other Ambulatory Visit: Payer: Self-pay | Admitting: Specialist

## 2015-12-28 DIAGNOSIS — R1310 Dysphagia, unspecified: Secondary | ICD-10-CM

## 2016-01-25 ENCOUNTER — Ambulatory Visit
Admission: RE | Admit: 2016-01-25 | Discharge: 2016-01-25 | Disposition: A | Discharge status: Home / Self Care | Payer: BLUE CROSS/BLUE SHIELD | Source: Ambulatory Visit | Attending: Specialist | Admitting: Specialist

## 2016-01-25 DIAGNOSIS — R1312 Dysphagia, oropharyngeal phase: Secondary | ICD-10-CM

## 2016-01-25 DIAGNOSIS — R1310 Dysphagia, unspecified: Secondary | ICD-10-CM | POA: Diagnosis not present

## 2016-01-25 NOTE — Therapy (Signed)
[place], [place], [address] Phone: [phone]   Fax:     Modified Barium Swallow  Patient Details  Name: Marcia [name] MRN: [id number] Date of Birth: 06-10-1971 No Data Recorded  Encounter Date: 01/25/2016      End of Session - 01/25/16 1320    Visit Number 1   Number of Visits 1   Date for SLP Re-Evaluation 01/25/16   SLP Start Time 58   SLP Stop Time  1320   SLP Time Calculation (min) 50 min   Activity Tolerance Patient tolerated treatment well      Past Medical History  Diagnosis Date  . Vulvar lesion   . Diabetes mellitus   . History of HPV infection   . Heart murmur   . Hay fever     Allergies  . 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 Thyroid disease   . Hypercholesterolemia   . Hypothyroidism   . Tobacco abuse   . Myocardial infarction ([place]) 1997    At age 40    Past Surgical History  Procedure Laterality Date  . Ankle surgery  2010  . Tubal ligation  2004  . Tubal ligation  2004, [facility]  . Abdominal hysterectomy  10/12/12  . Vulvar lesion removal  10/2009 & 05/2012  . Vulvar lesion removal N/A 04/07/2015    Procedure: VULVAR LESION;  Surgeon: Gillis Ends, MD;  Location: [facility] ORS;  Service: Gynecology;  Laterality: N/A;  with J-Plasma blade  . Breast biopsy Bilateral @ 2013    core with clips    There were no vitals filed for this visit.   Subjective: Patient behavior: (alertness, ability to follow instructions, etc.): Patient able to report her swallowing history and follow directions  Chief complaint: cough X8 months, question of microaspiration as cause   Objective:  Radiological Procedure: A videoflouroscopic evaluation of oral-preparatory, reflex initiation, and pharyngeal phases of the swallow was performed; as well as a screening of the upper esophageal phase.  I. POSTURE: Upright in MBS chair and standing  II. VIEW: Lateral and A-P  III. COMPENSATORY  STRATEGIES: N/A  IV. BOLUSES ADMINISTERED:    Thin Liquid: 2 small sips, 2 rapid consecutive gulps   Nectar-thick Liquid: 2 moderate size    Puree: 3 teaspoon presentations   Mechanical Soft: 1/4 graham cracker in apple sauce  V. RESULTS OF EVALUATION: A. ORAL PREPARATORY PHASE: (The lips, tongue, and velum are observed for strength and coordination)       **Overall Severity Rating: Within normal limits  B. SWALLOW INITIATION/REFLEX: (The reflex is normal if "triggered" by the time the bolus reached the base of the tongue)  **Overall Severity Rating: Mild; triggering at the valleculae for thick consistencies and while falling from the valleculae to the pyriform sinuses with thin liquid  C. PHARYNGEAL PHASE: (Pharyngeal function is normal if the bolus shows rapid, smooth, and continuous transit through the pharynx and there is no pharyngeal residue after the swallow)  **Overall Severity Rating: Within normal limits  D. LARYNGEAL PENETRATION: (Material entering into the laryngeal inlet/vestibule but not aspirated) None  E. ASPIRATION:  None  F. ESOPHAGEAL PHASE: (Screening of the upper esophagus) An esophageal sweep in the upright position with liquid and solid  consistencies showed distal-esophageal retention with retrograde flow within the  esophagus, but not through the PES.    ASSESSMENT: 45 year old woman, with 8 month cough and some coughing associated with drinking, is presenting  with minimal oropharyngeal dysphagia.  Oral control of the bolus including oral hold, rotary mastication, and anterior to posterior transfer are within normal limits. Timing of the pharyngeal swallow is delayed, triggering at the valleculae for thick consistencies and while falling from the valleculae to the pyriform sinuses with thin liquid.   Pharyngeal stage of swallowing including tongue base retraction, hyolaryngeal excursion, epiglottic inversion, duration/amplitude of UES opening, and laryngeal  vestibule closure at the height of the swallow are within normal limits.  There was no observed pharyngeal residue, laryngeal penetration, or aspiration.  The patient did not cough while swallowing, however, there were 2 episodes of cough between trials. It is not likely that the cough was elicited secondary aspiration, as views immediately before and after the cough showed a clear pharynx. An esophageal sweep in the upright position with liquid and solid  consistencies showed distal-esophageal retention with retrograde flow within the  esophagus, but not through the PES.  The patient's cough does not appear to be due to aspiration.  PLAN/RECOMMENDATIONS:   A. Diet: Regular   B. Swallowing Precautions:N/A   C. Recommended consultation to: follow up with Dr. Raul Del as scheduled   D. Therapy recommendations N/A   E. Results and recommendations were discussed with the patient immediately following the study and the final report routed to the referring MD.     Dysphagia, oropharyngeal phase  Dysphagia - Plan: DG SWALLOWING FUNC-SPEECH PATHOLOGY, DG SWALLOWING FUNC-SPEECH PATHOLOGY        Problem List Patient Active Problem List   Diagnosis Date Noted  . 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 Staphylococcal infection of skin 10/13/2014  . Furuncle of vulva 10/13/2014  . Hyperlipidemia due to type 1 diabetes mellitus ([place]) 09/12/2014  . Encounter for preventive health examination 09/12/2014  . Onychomycosis of toenail 09/10/2014  . Type 1 diabetes mellitus with neurological manifestations, uncontrolled ([place]) 02/10/2014  . Cough 02/05/2014  . Chronic bronchitis ([place]) 11/27/2013  . Unspecified constipation 11/11/2013  . Insomnia secondary to anxiety 07/09/2013  . History of hyperthyroidism 07/01/2013  . Snoring disorder 12/26/2012  . Tobacco abuse 05/22/2011  . Tobacco abuse counseling 05/22/2011  . ABNORMAL EKG  09/10/2009   Marcia Sea, MS/CCC- SLP  [name] 01/25/2016, Wandra Mannan PM  [facility] [place], [place], [address] Phone: [phone]   Fax:     Name: Marcia [name] MRN: [id number] Date of Birth: January 30, 1971

## 2016-02-16 ENCOUNTER — Inpatient Hospital Stay: Payer: BLUE CROSS/BLUE SHIELD | Attending: Obstetrics and Gynecology | Admitting: Obstetrics and Gynecology

## 2016-02-16 VITALS — BP 119/81 | HR 86 | Temp 99.1°F | Ht 62.0 in | Wt 133.2 lb

## 2016-02-16 DIAGNOSIS — J42 Unspecified chronic bronchitis: Secondary | ICD-10-CM | POA: Insufficient documentation

## 2016-02-16 DIAGNOSIS — E1065 Type 1 diabetes mellitus with hyperglycemia: Secondary | ICD-10-CM | POA: Diagnosis not present

## 2016-02-16 DIAGNOSIS — F17210 Nicotine dependence, cigarettes, uncomplicated: Secondary | ICD-10-CM | POA: Diagnosis not present

## 2016-02-16 DIAGNOSIS — R011 Cardiac murmur, unspecified: Secondary | ICD-10-CM | POA: Insufficient documentation

## 2016-02-16 DIAGNOSIS — A630 Anogenital (venereal) warts: Secondary | ICD-10-CM | POA: Diagnosis not present

## 2016-02-16 DIAGNOSIS — D071 Carcinoma in situ of vulva: Secondary | ICD-10-CM | POA: Insufficient documentation

## 2016-02-16 DIAGNOSIS — Z7982 Long term (current) use of aspirin: Secondary | ICD-10-CM | POA: Diagnosis not present

## 2016-02-16 DIAGNOSIS — I252 Old myocardial infarction: Secondary | ICD-10-CM | POA: Diagnosis not present

## 2016-02-16 DIAGNOSIS — E1049 Type 1 diabetes mellitus with other diabetic neurological complication: Secondary | ICD-10-CM

## 2016-02-16 DIAGNOSIS — Z79890 Hormone replacement therapy (postmenopausal): Secondary | ICD-10-CM | POA: Diagnosis not present

## 2016-02-16 DIAGNOSIS — N901 Moderate vulvar dysplasia: Secondary | ICD-10-CM

## 2016-02-16 DIAGNOSIS — Z79899 Other long term (current) drug therapy: Secondary | ICD-10-CM | POA: Insufficient documentation

## 2016-02-16 DIAGNOSIS — E039 Hypothyroidism, unspecified: Secondary | ICD-10-CM | POA: Diagnosis not present

## 2016-02-16 DIAGNOSIS — Z90710 Acquired absence of both cervix and uterus: Secondary | ICD-10-CM

## 2016-02-16 DIAGNOSIS — J301 Allergic rhinitis due to pollen: Secondary | ICD-10-CM

## 2016-02-16 DIAGNOSIS — L292 Pruritus vulvae: Secondary | ICD-10-CM

## 2016-02-16 DIAGNOSIS — K5900 Constipation, unspecified: Secondary | ICD-10-CM | POA: Diagnosis not present

## 2016-02-16 DIAGNOSIS — Z794 Long term (current) use of insulin: Secondary | ICD-10-CM | POA: Diagnosis not present

## 2016-02-16 NOTE — Progress Notes (Signed)
Gynecologic Oncology Interval Note  Chief Concern: Vulvar dysplasia and some vulvar discomfort.  Subjective:  Marcia [name] is a 44 y.o. 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 Complains of some vulvar pain.    04/07/15:  Had laser ablation in OR with Dr Theora Gianotti.  "Acetowhite epithelium involving bilateral labia minora of the vulva each approximately 3 cm. On the left the abnormalities extending to the medial aspect of the labia minora. There was a another patch of acetowhite on right vulva at 3 o'clock as well as another area at the perineum. There were scattered small 1-2 mm areas of acetowhite extending toward the anus."  Has some vulvar itching treated in the past with OTC steroid cream.   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 1/14     laser vaporization of raised epithelium consistent with VIN, mainly on the anterior portion of the labia minora. A few other scattered areas around the external genitalia. 8/16         Laser ablation of vulva by Dr Theora Gianotti Prior hysterectomy  GYN History:   Gravida 3    Para 2    Age at Menarche 28    Regular Pap Smears Yes    History of HPV Infection    Cycle Normal  LMP 08-23-2009    Additional Hx no other gyn problems in the past   Problem List: Patient Active Problem List   Diagnosis Date Noted  . 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 Staphylococcal infection of skin 10/13/2014  . Furuncle of vulva 10/13/2014  . Hyperlipidemia due to type 1 diabetes mellitus ([place]) 09/12/2014  . Encounter for preventive health examination 09/12/2014  .  Onychomycosis of toenail 09/10/2014  . Type 1 diabetes mellitus with neurological manifestations, uncontrolled ([place]) 02/10/2014  . Cough 02/05/2014  . Chronic bronchitis ([place]) 11/27/2013  . Unspecified constipation 11/11/2013  . Insomnia secondary to anxiety 07/09/2013  . History of hyperthyroidism 07/01/2013  . Snoring disorder 12/26/2012  . Tobacco abuse 05/22/2011  . Tobacco abuse counseling 05/22/2011  . ABNORMAL EKG 09/10/2009    Past Medical History: Past Medical History  Diagnosis Date  . Vulvar lesion   . Diabetes mellitus   . History of HPV infection   . Heart murmur   . Hay fever     Allergies  . 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 Thyroid disease   . Hypercholesterolemia   . Hypothyroidism   . Tobacco abuse   . Myocardial infarction ([place]) 1997    At age 7    Past Surgical History: Past Surgical History  Procedure Laterality Date  . Ankle surgery  2010  . Tubal ligation  2004  . Tubal ligation  2004, [facility]  . Abdominal hysterectomy  10/12/12  . Vulvar lesion removal  10/2009 & 05/2012  . Vulvar lesion removal N/A 04/07/2015    Procedure: VULVAR LESION;  Surgeon: Gillis Ends, MD;  Location: [facility] ORS;  Service: Gynecology;  Laterality: N/A;  with J-Plasma blade  . Breast biopsy Bilateral @ 2013    core with clips    Family History: Family History  Problem Relation Age of Onset  . Cancer Other     Aunt-breast; grandmother- lung and ovarian  . Diabetes Father   . Hypertension Mother   . Hyperlipidemia Mother   . Aneurysm Brother   . Breast cancer Neg Hx     Social History: Social History   Social History  . Marital Status: Single    Spouse Name: N/A  . Number of Children: N/A  . Years of Education: N/A   Occupational History  . Not on file.   Social History Main Topics  . Smoking status: Current Every Day Smoker -- 0.50 packs/day for 22 years    Types: Cigarettes  . Smokeless tobacco: Never Used     Comment: Down to .5ppd, trying to quit.   Allergic to chantix and e-cigs 02/05/14  . Alcohol Use: No  . Drug Use: No  . Sexual Activity: Yes   Other Topics Concern  . Not on file   Social History Narrative   No regular exercise.   Lives with spouse.    Allergies: Allergies  Allergen Reactions  . Chantix [Varenicline] Rash  . Septra [Sulfamethoxazole-Trimethoprim] Rash    Current Medications: Current Outpatient Prescriptions  Medication Sig Dispense Refill  . ACCU-CHEK AVIVA PLUS test strip TEST 2 TIMES A DAY 100 each 6  . ACCU-CHEK AVIVA PLUS test strip TEST 2 TIMES A DAY 100 each 6  . albuterol (PROVENTIL HFA;VENTOLIN HFA) 108 (90 BASE) MCG/ACT inhaler Inhale 2 puffs into the lungs every 6 (six) hours as needed for wheezing or shortness of breath. 1 Inhaler 11  . aspirin 81 MG EC tablet TAKE 1 TABLET BY MOUTH EVERY DAY 30 tablet 11  . B-D ULTRAFINE III SHORT PEN 31G X 8 MM MISC USE TWO NEEDLES DAILY 100 each 8  . Blood Glucose Monitoring Suppl (ACCU-CHEK AVIVA PLUS) W/DEVICE KIT 1 Device by Does not apply route once. 1 kit 0  . canagliflozin (INVOKANA) 300 MG TABS tablet Take 300 mg by mouth daily before breakfast.    . insulin aspart (NOVOLOG FLEXPEN) 100 UNIT/ML FlexPen 4 units with breakfast, and 4 units with the evening meal. 15 mL 11  . Insulin Glargine (LANTUS SOLOSTAR) 100 UNIT/ML Solostar Pen Inject 27 Units into the skin every morning. 5 pen PRN  . levothyroxine (SYNTHROID, LEVOTHROID) 112 MCG tablet TAKE 1 TABLET (112 MCG TOTAL) BY MOUTH DAILY. 30 tablet 1  . omeprazole (PRILOSEC) 40 MG capsule Take 40 mg by mouth daily.    . simvastatin (ZOCOR) 40 MG tablet Take 40 mg by mouth every evening.    Marland Kitchen Spacer/Aero-Holding Josiah Lobo KIT For use with [facility] and pro air [facility] please substitute the appropriate spacer needed 1 each 1  . Tiotropium Bromide Monohydrate (SPIRIVA RESPIMAT) 2.5 MCG/ACT AERS INHALE 2 PUFFS INTO THE LUNGS DAILY. 4 g 5  . citalopram (CELEXA) 10 MG tablet TAKE 1 TABLET BY MOUTH EVERY DAY  (Patient not taking: Reported on 12/09/2015) 30 tablet 2  . estradiol (VIVELLE-DOT) 0.05 MG/24HR patch Place 1 patch onto the skin once a week. Reported on 12/09/2015    . Fluticasone-Salmeterol (ADVAIR DISKUS) 250-50 MCG/DOSE AEPB Inhale 1 puff into the lungs 2 (two) times daily. (Patient not taking: Reported on 12/09/2015) 60 each 6  . metoprolol tartrate (LOPRESSOR) 25 MG tablet Take 1 tablet (25 mg  total) by mouth 2 (two) times daily. (Patient not taking: Reported on 12/09/2015) 180 tablet 3  . silver sulfADIAZINE (SILVADENE) 1 % cream Apply 1 application topically daily. (Patient not taking: Reported on 12/09/2015) 50 g 0   No current facility-administered medications for this visit.   Facility-Administered Medications Ordered in Other Visits  Medication Dose Route Frequency Provider Last Rate Last Dose  . lidocaine (XYLOCAINE) 2 % jelly 1 application  1 application Topical QID PRN Angeles Gaetana Michaelis, MD        Review of Systems Pertinent items are noted in HPI.  Objective:  Physical Examination:  BP 119/81 mmHg  Pulse 86  Temp(Src) 99.1 F (37.3 C) (Tympanic)  Ht '5\' 2"'$  (1.575 m)  Wt 133 lb 2.5 oz (60.4 kg)  BMI 24.35 kg/m2  LMP 10/12/2012  ECOG Performance Status: 1 - Symptomatic but completely ambulatory  General appearance: alert, cooperative and appears stated age HEENT:PERRLA, neck supple with midline trachea and thyroid without masses Lymph node survey: non-palpable, axillary, inguinal, supraclavicular Cardiovascular: regular rate and rhythm, no murmurs or gallops Respiratory: normal air entry, lungs clear to auscultation Abdomen: soft, non-tender, without masses or organomegaly, no hernias and well healed incision Back: inspection of back is normal Extremities: extremities normal, atraumatic, no cyanosis or edema Skin exam - normal coloration and turgor, no rashes, no suspicious skin lesions noted. Neurological exam reveals alert, oriented, normal speech, no focal findings  or movement disorder noted.  Pelvic: exam chaperoned by nurse;  Vulva: there are two infected hair follicles, one on the left mons and one on the right labia.  No evidence of cellulitis.  Vagina: normal; Adnexa: normal adnexa in size, nontender and no masses; Uterus: absent; Cervix: absent; Rectal: no masses    Colposcopy of the vulva was performed with acetic acid and there is no evidence of vulvar dysplasia. Consent signed and time out done.  Assessment:  SHAJUAN MUSSO is a 45 y.o. female with a history of vulvar dysplasia VINIII s/p laser 04/07/15 with no evidence of recurrence.  Smokes half a pack a day and trying to quit.  She has two infected hair follicles in the right vulva and mons, but no evidence of cellulitis.   Plan:   Problem List Items Addressed This Visit      Musculoskeletal and Integument   VIN (vulval intraepithelial neoplasia) II - Primary      She will RTC in 1 year for follow up or sooner if new lesions.  Will also follow up with her primary care doctor. Discussed importance of tobacco cessation in controlling VIN in the future.   Mellody Drown, MD  CC:  Kasandra Knudsen, [place] [address] [place], [place] [address] [phone]

## 2016-02-16 NOTE — Progress Notes (Signed)
Patient here for follow up. Has two lesion in the one in groin and one on out vulva. Pain full no drainage.

## 2016-02-16 NOTE — Progress Notes (Signed)
  Oncology Nurse Navigator Documentation  Navigator Location: [facility] (02/16/16 1500) Navigator Encounter Type: Other (02/16/16 1500)             Treatment Phase: Follow-up (02/16/16 1500) Barriers/Navigation Needs: No barriers at this time (02/16/16 1500)                Acuity: Level 1 (02/16/16 1500) Acuity Level 1: Initial guidance, education and coordination as needed;Minimal follow up required (02/16/16 1500)       Time Spent with Patient: 30 (02/16/16 1500)   Chaperoned pelvic exam. Pt already has follow up arranged.

## 2016-05-19 ENCOUNTER — Inpatient Hospital Stay: Payer: BLUE CROSS/BLUE SHIELD | Attending: Internal Medicine | Admitting: Internal Medicine

## 2016-05-19 ENCOUNTER — Inpatient Hospital Stay: Payer: BLUE CROSS/BLUE SHIELD

## 2016-05-19 DIAGNOSIS — E119 Type 2 diabetes mellitus without complications: Secondary | ICD-10-CM | POA: Diagnosis not present

## 2016-05-19 DIAGNOSIS — Z8589 Personal history of malignant neoplasm of other organs and systems: Secondary | ICD-10-CM | POA: Diagnosis not present

## 2016-05-19 DIAGNOSIS — D72829 Elevated white blood cell count, unspecified: Secondary | ICD-10-CM

## 2016-05-19 DIAGNOSIS — D751 Secondary polycythemia: Secondary | ICD-10-CM

## 2016-05-19 DIAGNOSIS — M542 Cervicalgia: Secondary | ICD-10-CM

## 2016-05-19 DIAGNOSIS — E039 Hypothyroidism, unspecified: Secondary | ICD-10-CM

## 2016-05-19 DIAGNOSIS — Z8041 Family history of malignant neoplasm of ovary: Secondary | ICD-10-CM | POA: Diagnosis not present

## 2016-05-19 DIAGNOSIS — E7800 Pure hypercholesterolemia, unspecified: Secondary | ICD-10-CM

## 2016-05-19 DIAGNOSIS — Z794 Long term (current) use of insulin: Secondary | ICD-10-CM | POA: Diagnosis not present

## 2016-05-19 DIAGNOSIS — E079 Disorder of thyroid, unspecified: Secondary | ICD-10-CM | POA: Diagnosis not present

## 2016-05-19 DIAGNOSIS — R011 Cardiac murmur, unspecified: Secondary | ICD-10-CM | POA: Diagnosis not present

## 2016-05-19 DIAGNOSIS — Z801 Family history of malignant neoplasm of trachea, bronchus and lung: Secondary | ICD-10-CM | POA: Diagnosis not present

## 2016-05-19 DIAGNOSIS — F17210 Nicotine dependence, cigarettes, uncomplicated: Secondary | ICD-10-CM

## 2016-05-19 DIAGNOSIS — I252 Old myocardial infarction: Secondary | ICD-10-CM

## 2016-05-19 DIAGNOSIS — Z803 Family history of malignant neoplasm of breast: Secondary | ICD-10-CM

## 2016-05-19 LAB — CBC WITH DIFFERENTIAL/PLATELET
BASOS ABS: 0.1 10*3/uL (ref 0–0.1)
BASOS PCT: 1 %
EOS PCT: 0 %
Eosinophils Absolute: 0 10*3/uL (ref 0–0.7)
HCT: 45.7 % (ref 35.0–47.0)
Hemoglobin: 15.9 g/dL (ref 12.0–16.0)
Lymphocytes Relative: 21 %
Lymphs Abs: 2.5 10*3/uL (ref 1.0–3.6)
MCH: 31.2 pg (ref 26.0–34.0)
MCHC: 34.8 g/dL (ref 32.0–36.0)
MCV: 89.4 fL (ref 80.0–100.0)
MONO ABS: 0.8 10*3/uL (ref 0.2–0.9)
Monocytes Relative: 6 %
Neutro Abs: 9 10*3/uL — ABNORMAL HIGH (ref 1.4–6.5)
Neutrophils Relative %: 72 %
PLATELETS: 263 10*3/uL (ref 150–440)
RBC: 5.11 MIL/uL (ref 3.80–5.20)
RDW: 13 % (ref 11.5–14.5)
WBC: 12.4 10*3/uL — ABNORMAL HIGH (ref 3.6–11.0)

## 2016-05-19 LAB — SAVE SMEAR

## 2016-05-19 NOTE — Assessment & Plan Note (Signed)
likley reactive - chroni cbronchitis and smokign Review smear Bcr- abl

## 2016-05-19 NOTE — Progress Notes (Signed)
Patient here for referral. Sleep pattern normal, bowel and bladder WNL, appetite good. "Syays tired all day long.

## 2016-05-19 NOTE — Assessment & Plan Note (Signed)
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

## 2016-05-19 NOTE — Progress Notes (Signed)
[place]  [phone] [phone]  ID: Marcia [name] OB: 05/18/1971  MR#: [id number]  INO#:[id number]  Patient Care Team: Kasandra Knudsen, NP as PCP - General (Nurse Practitioner)  CHIEF COMPLAINT: abnormal blood count    INTERVAL HISTORY: Marcia [name] is a pleasant 45 yr old lady who had been in her usual state of health until recenlly when she was note to have an elevated wbc ct and hgb on a visit to her pcp Wbc was about 11.6 and hgb was about 16.3. Review of her old labs indicate that she has had the numbers in the same range for at least a year with no siginificant change   She is long standing active smoker with frequent bronchitis symptoms She denies any headaches, blurred vision, her main complaint over he past few weeks has been pain in the left neck that began suddenly one day in August with no clear antecedent cause. ANA, RF, esr were normal, she is currently on nsaids  preScribed by her PCP with only partiAl relief This pain is constant and dull ache, associated with tingling in the left thumb at the base No weakness of the arm. No motor weakness  No loss of weight No skin rashes, No early satiety   REVIEW OF SYSTEMS:   ROS  As per HPI. Otherwise, a complete review of systems is negative.  PAST MEDICAL HISTORY: Past Medical History:  Diagnosis Date  . Diabetes mellitus   . Hay fever    Allergies  . Heart murmur   . History of HPV infection   . Hypercholesterolemia   . Hypothyroidism   . Myocardial infarction 1997   At age 39  . Thyroid disease   . Tobacco abuse   . Vaginal cancer ([place])   . Vulvar lesion     PAST SURGICAL HISTORY: Past Surgical History:  Procedure Laterality Date  . ABDOMINAL HYSTERECTOMY  10/12/12  . ANKLE SURGERY  2010  . BREAST BIOPSY Bilateral @ 2013   core with clips  . TUBAL LIGATION  2004  . TUBAL LIGATION  2004, [facility]  . VULVAR LESION REMOVAL  10/2009 & 05/2012  . VULVAR LESION  REMOVAL N/A 04/07/2015   Procedure: VULVAR LESION;  Surgeon: Gillis Ends, MD;  Location: [facility] ORS;  Service: Gynecology;  Laterality: N/A;  with J-Plasma blade    FAMILY HISTORY: Family History  Problem Relation Age of Onset  . Diabetes Father   . Hypertension Mother   . Hyperlipidemia Mother   . Cancer Other     Aunt-breast; grandmother- lung and ovarian  . Aneurysm Brother   . Breast cancer Neg Hx     ADVANCED DIRECTIVES (Y/N):  N  HEALTH MAINTENANCE: Social History  Substance Use Topics  . Smoking status: Current Every Day Smoker    Packs/day: 0.50    Years: 22.00    Types: Cigarettes  . Smokeless tobacco: Never Used     Comment: Down to .5ppd, trying to quit.  Allergic to chantix and e-cigs 02/05/14  . Alcohol use No     Colonoscopy:  PAP:  Bone density:  Lipid panel:  Allergies  Allergen Reactions  . Chantix [Varenicline] Rash  . Septra [Sulfamethoxazole-Trimethoprim] Rash    Current Outpatient Prescriptions  Medication Sig Dispense Refill  . ACCU-CHEK AVIVA PLUS test strip TEST 2 TIMES A DAY 100 each 6  . ACCU-CHEK AVIVA PLUS test strip TEST 2 TIMES A DAY 100 each 6  . B-D ULTRAFINE III SHORT  PEN 31G X 8 MM MISC USE TWO NEEDLES DAILY 100 each 8  . Blood Glucose Monitoring Suppl (ACCU-CHEK AVIVA PLUS) W/DEVICE KIT 1 Device by Does not apply route once. 1 kit 0  . canagliflozin (INVOKANA) 300 MG TABS tablet Take 300 mg by mouth daily before breakfast.    . citalopram (CELEXA) 10 MG tablet TAKE 1 TABLET BY MOUTH EVERY DAY 30 tablet 2  . cyclobenzaprine (FLEXERIL) 10 MG tablet Take 10 mg by mouth 3 (three) times daily as needed for muscle spasms.    . Fluticasone-Salmeterol (ADVAIR DISKUS) 250-50 MCG/DOSE AEPB Inhale 1 puff into the lungs 2 (two) times daily. 60 each 6  . Ibuprofen-Famotidine (DUEXIS) 800-26.6 MG TABS Take 1 tablet by mouth every 8 (eight) hours.    . insulin aspart (NOVOLOG FLEXPEN) 100 UNIT/ML FlexPen 4 units with breakfast, and 4 units  with the evening meal. 15 mL 11  . Insulin Glargine (LANTUS SOLOSTAR) 100 UNIT/ML Solostar Pen Inject 27 Units into the skin every morning. 5 pen PRN  . levothyroxine (SYNTHROID, LEVOTHROID) 112 MCG tablet TAKE 1 TABLET (112 MCG TOTAL) BY MOUTH DAILY. 30 tablet 1  . simvastatin (ZOCOR) 40 MG tablet Take 40 mg by mouth every evening.     No current facility-administered medications for this visit.    Facility-Administered Medications Ordered in Other Visits  Medication Dose Route Frequency Provider Last Rate Last Dose  . lidocaine (XYLOCAINE) 2 % jelly 1 application  1 application Topical QID PRN Marcia Leta Jungling, MD        OBJECTIVE: Vitals:   05/19/16 1041  BP: 122/81  Pulse: 93  Temp: 98.6 F (37 C)     Body mass index is 24.96 kg/m.   1.65 meters squared   Physical Exam  Constitutional: She is oriented to person, place, and time. She appears well-developed and well-nourished.  HENT:  Head: Normocephalic and atraumatic.  Nose: Nose normal.  Eyes: EOM are normal. Pupils are equal, round, and reactive to light.  Neck: No JVD present. No tracheal deviation present. No thyromegaly present.  There is restricted range of motion with movement, tender on the muscles of the left lateral neck No palpable lumps   Cardiovascular: Normal rate and regular rhythm.   Pulmonary/Chest: Effort normal and breath sounds normal.  Abdominal: Soft.  Musculoskeletal:  See neck exam, otherwise ROM of all joints is normal. No signs of swelling or inflammation  Lymphadenopathy:    She has no cervical adenopathy.  Neurological: She is alert and oriented to person, place, and time.     LAB RESULTS:  Lab Results  Component Value Date   NA 132 (L) 03/15/2015   K 3.9 03/15/2015   CL 99 (L) 03/15/2015   CO2 19 (L) 03/15/2015   GLUCOSE 203 (H) 03/15/2015   BUN 15 03/15/2015   CREATININE 0.75 03/15/2015   CALCIUM 8.9 03/15/2015   PROT 7.2 09/10/2014   ALBUMIN 4.1 09/10/2014   AST 20  09/10/2014   ALT 18 09/10/2014   ALKPHOS 75 09/10/2014   BILITOT 0.8 09/10/2014   GFRNONAA >60 03/15/2015   GFRAA >60 03/15/2015    Lab Results  Component Value Date   WBC 12.4 (H) 05/19/2016   NEUTROABS 9.0 (H) 05/19/2016   HGB 15.9 05/19/2016   HCT 45.7 05/19/2016   MCV 89.4 05/19/2016   PLT 263 05/19/2016     STUDIES: No results found.  ASSESSMENT:  Erythrocytosis Mild eryhtrocytopsis, likely due to smoking jak 2 to rule out polycythemia  Sleep study rpeorte dnegative, no sleep apnea  trytin to quit smoking epo level  Leukocytosis likley reactive - chroni cbronchitis and smokign Review smear Bcr- abl  Neck pain on left side With paresthesias over the left thumb base, suspect nerve root compression, cervical disc disease, she is being worked up by PCP, reporetdly has an appointment coming up with ortho, encouraged her to keep her follow- up for the same.    PLAN:    Patient expressed understanding and was in agreement with this plan. She also understands that She can call clinic at any time with any questions, concerns, or complaints.  Orders Placed This Encounter  Procedures  . Erythropoietin    Standing Status:   Future    Number of Occurrences:   1    Standing Expiration Date:   05/19/2017  . CBC with Differential    Standing Status:   Future    Number of Occurrences:   1    Standing Expiration Date:   05/19/2017  . Save smear  . JAK2  V617F Qual. with reflex to Exon 12    Standing Status:   Future    Number of Occurrences:   1    Standing Expiration Date:   05/19/2017  . BCR-ABL1, CML/ALL, PCR, QUANT    Standing Status:   Future    Number of Occurrences:   1    Standing Expiration Date:   05/19/2017    Return in about 4 weeks (around 06/16/2016). No matching staging information was found for the patient.  Creola Corn, MD   05/19/2016 4:04 PM

## 2016-05-19 NOTE — Assessment & Plan Note (Signed)
With paresthesias over the left thumb base, suspect nerve root compression, cervical disc disease, she is being worked up by PCP, reporetdly has an appointment coming up with ortho, encouraged her to keep her follow- up for the same.

## 2016-05-20 LAB — ERYTHROPOIETIN: Erythropoietin: 4.2 m[IU]/mL (ref 2.6–18.5)

## 2016-05-25 LAB — BCR-ABL1, CML/ALL, PCR, QUANT

## 2016-05-31 LAB — JAK2 EXONS 12-15

## 2016-05-31 LAB — JAK2  V617F QUAL. WITH REFLEX TO EXON 12: Reflex:: 15

## 2016-06-07 ENCOUNTER — Ambulatory Visit: Payer: Medicaid Other

## 2016-06-13 ENCOUNTER — Inpatient Hospital Stay (HOSPITAL_BASED_OUTPATIENT_CLINIC_OR_DEPARTMENT_OTHER): Payer: BLUE CROSS/BLUE SHIELD | Admitting: Internal Medicine

## 2016-06-13 ENCOUNTER — Inpatient Hospital Stay: Payer: BLUE CROSS/BLUE SHIELD | Attending: Internal Medicine

## 2016-06-13 ENCOUNTER — Ambulatory Visit: Payer: BLUE CROSS/BLUE SHIELD | Admitting: Podiatry

## 2016-06-13 VITALS — BP 114/77 | HR 85 | Temp 98.1°F | Wt 136.5 lb

## 2016-06-13 DIAGNOSIS — Z801 Family history of malignant neoplasm of trachea, bronchus and lung: Secondary | ICD-10-CM | POA: Diagnosis not present

## 2016-06-13 DIAGNOSIS — Z8041 Family history of malignant neoplasm of ovary: Secondary | ICD-10-CM | POA: Diagnosis not present

## 2016-06-13 DIAGNOSIS — F17210 Nicotine dependence, cigarettes, uncomplicated: Secondary | ICD-10-CM | POA: Insufficient documentation

## 2016-06-13 DIAGNOSIS — Z79899 Other long term (current) drug therapy: Secondary | ICD-10-CM | POA: Insufficient documentation

## 2016-06-13 DIAGNOSIS — D72829 Elevated white blood cell count, unspecified: Secondary | ICD-10-CM | POA: Diagnosis not present

## 2016-06-13 DIAGNOSIS — M542 Cervicalgia: Secondary | ICD-10-CM

## 2016-06-13 DIAGNOSIS — Z794 Long term (current) use of insulin: Secondary | ICD-10-CM

## 2016-06-13 DIAGNOSIS — D751 Secondary polycythemia: Secondary | ICD-10-CM

## 2016-06-13 DIAGNOSIS — Z90722 Acquired absence of ovaries, bilateral: Secondary | ICD-10-CM

## 2016-06-13 DIAGNOSIS — E119 Type 2 diabetes mellitus without complications: Secondary | ICD-10-CM | POA: Insufficient documentation

## 2016-06-13 DIAGNOSIS — I252 Old myocardial infarction: Secondary | ICD-10-CM

## 2016-06-13 DIAGNOSIS — M479 Spondylosis, unspecified: Secondary | ICD-10-CM | POA: Insufficient documentation

## 2016-06-13 DIAGNOSIS — D72828 Other elevated white blood cell count: Secondary | ICD-10-CM

## 2016-06-13 DIAGNOSIS — R011 Cardiac murmur, unspecified: Secondary | ICD-10-CM | POA: Diagnosis not present

## 2016-06-13 DIAGNOSIS — N903 Dysplasia of vulva, unspecified: Secondary | ICD-10-CM | POA: Insufficient documentation

## 2016-06-13 DIAGNOSIS — Z803 Family history of malignant neoplasm of breast: Secondary | ICD-10-CM | POA: Insufficient documentation

## 2016-06-13 DIAGNOSIS — E7800 Pure hypercholesterolemia, unspecified: Secondary | ICD-10-CM

## 2016-06-13 DIAGNOSIS — Z8589 Personal history of malignant neoplasm of other organs and systems: Secondary | ICD-10-CM | POA: Insufficient documentation

## 2016-06-13 DIAGNOSIS — Z90710 Acquired absence of both cervix and uterus: Secondary | ICD-10-CM | POA: Diagnosis not present

## 2016-06-13 DIAGNOSIS — E039 Hypothyroidism, unspecified: Secondary | ICD-10-CM

## 2016-06-13 NOTE — Progress Notes (Signed)
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 Neck ain remaisn the same, seeign ortho, diagnosed to have cervical nerve root compression.   REVIEW OF SYSTEMS:   ROS  As per HPI. Otherwise, a complete review of systems is negative.  PAST MEDICAL HISTORY: Past Medical History:  Diagnosis Date  . Cancer ([place])   . Diabetes mellitus   . Hay fever    Allergies  . Heart murmur   . History of HPV infection   . Hypercholesterolemia   . Hypothyroidism   . Myocardial infarction 1997   At age 45  . Thyroid disease   . Tobacco abuse   . Vaginal cancer ([place])   . Vulvar lesion     PAST SURGICAL HISTORY: Past Surgical History:  Procedure Laterality Date  . ABDOMINAL HYSTERECTOMY  10/12/12  . ANKLE SURGERY  2010  . BREAST BIOPSY Bilateral @ 2013   core with clips  . TUBAL LIGATION  2004  . TUBAL LIGATION  2004, [facility]  . VULVAR LESION REMOVAL  10/2009 & 05/2012  . VULVAR LESION REMOVAL N/A 04/07/2015   Procedure: VULVAR LESION;  Surgeon: Gillis Ends, MD;  Location: [facility] ORS;  Service: Gynecology;  Laterality: N/A;  with J-Plasma blade    FAMILY HISTORY: Family History  Problem Relation Age of Onset  . Diabetes Father   . Hypertension Mother   . Hyperlipidemia Mother   . Cancer Other     Aunt-breast; grandmother- lung and ovarian  . Aneurysm Brother   . Breast cancer Neg Hx     ADVANCED DIRECTIVES (Y/N):  N  HEALTH MAINTENANCE: Social History  Substance Use Topics  . Smoking status: Current Every Day Smoker    Packs/day: 0.50    Years: 22.00    Types: Cigarettes  . Smokeless  tobacco: Never Used     Comment: Down to .5ppd, trying to quit.  Allergic to chantix and e-cigs 02/05/14  . Alcohol use No     Colonoscopy:  PAP:  Bone density:  Lipid panel:  Allergies  Allergen Reactions  . Chantix [Varenicline] Rash  . Septra [Sulfamethoxazole-Trimethoprim] Rash    Current Outpatient Prescriptions  Medication Sig Dispense Refill  . ACCU-CHEK AVIVA PLUS test strip TEST 2 TIMES A DAY 100 each 6  . ACCU-CHEK AVIVA PLUS test strip TEST 2 TIMES A DAY 100 each 6  . B-D ULTRAFINE III SHORT PEN 31G X 8 MM MISC USE TWO NEEDLES DAILY 100 each 8  . Blood Glucose Monitoring Suppl (ACCU-CHEK AVIVA PLUS) W/DEVICE KIT 1 Device by Does not apply route once. 1 kit 0  . canagliflozin (INVOKANA) 300 MG TABS tablet Take 300 mg by mouth daily before breakfast.    . citalopram (CELEXA) 10 MG tablet TAKE 1 TABLET BY MOUTH EVERY DAY 30 tablet 2  . cyclobenzaprine (FLEXERIL) 10 MG tablet Take 10 mg by mouth 3 (three) times daily as needed for muscle spasms.    . Fluticasone-Salmeterol (ADVAIR DISKUS) 250-50 MCG/DOSE AEPB Inhale 1 puff into the lungs 2 (two) times daily. 60 each 6  . Ibuprofen-Famotidine (DUEXIS) 800-26.6 MG TABS Take 1 tablet by mouth every 8 (eight) hours.    Marland Kitchen  insulin aspart (NOVOLOG FLEXPEN) 100 UNIT/ML FlexPen 4 units with breakfast, and 4 units with the evening meal. 15 mL 11  . Insulin Glargine (LANTUS SOLOSTAR) 100 UNIT/ML Solostar Pen Inject 27 Units into the skin every morning. 5 pen PRN  . levothyroxine (SYNTHROID, LEVOTHROID) 112 MCG tablet TAKE 1 TABLET (112 MCG TOTAL) BY MOUTH DAILY. 30 tablet 1  . simvastatin (ZOCOR) 40 MG tablet Take 40 mg by mouth every evening.     No current facility-administered medications for this visit.    Facility-Administered Medications Ordered in Other Visits  Medication Dose Route Frequency Provider Last Rate Last Dose  . lidocaine (XYLOCAINE) 2 % jelly 1 application  1 application Topical QID PRN Angeles Gaetana Michaelis, MD         OBJECTIVE: Vitals:   06/13/16 0937  BP: 114/77  Pulse: 85  Temp: 98.1 F (36.7 C)     Body mass index is 24.96 kg/m.   1.65 meters squared   Physical Exam  Constitutional: She is oriented to person, place, and time. She appears well-developed and well-nourished.  HENT:  Head: Normocephalic and atraumatic.  Nose: Nose normal.  Eyes: EOM are normal. Pupils are equal, round, and reactive to light.  Neck: No JVD present. No tracheal deviation present. No thyromegaly present.  There is restricted range of motion with movement, tender on the muscles of the left lateral neck No palpable lumps   Cardiovascular: Normal rate and regular rhythm.   Pulmonary/Chest: Effort normal and breath sounds normal.  Abdominal: Soft.  Musculoskeletal:  See neck exam, otherwise ROM of all joints is normal. No signs of swelling or inflammation  Lymphadenopathy:    She has no cervical adenopathy.  Neurological: She is alert and oriented to person, place, and time.     LAB RESULTS:  Lab Results  Component Value Date   NA 132 (L) 03/15/2015   K 3.9 03/15/2015   CL 99 (L) 03/15/2015   CO2 19 (L) 03/15/2015   GLUCOSE 203 (H) 03/15/2015   BUN 15 03/15/2015   CREATININE 0.75 03/15/2015   CALCIUM 8.9 03/15/2015   PROT 7.2 09/10/2014   ALBUMIN 4.1 09/10/2014   AST 20 09/10/2014   ALT 18 09/10/2014   ALKPHOS 75 09/10/2014   BILITOT 0.8 09/10/2014   GFRNONAA >60 03/15/2015   GFRAA >60 03/15/2015    Lab Results  Component Value Date   WBC 12.4 (H) 05/19/2016   NEUTROABS 9.0 (H) 05/19/2016   HGB 15.9 05/19/2016   HCT 45.7 05/19/2016   MCV 89.4 05/19/2016   PLT 263 05/19/2016      ASSESSMENT:    Review of labs drawn from 05/19/2016:   JAK 2  V 617 F  mutation was negative JAK2 mutations in exon 12 through 15 were also negative. A troponin level was normal at 4.2.  BCR- ABL mutation was negative by RT-PCR    Left neck pain related to cervical spondylosis. Patient was seen  orthopedics surgery and is contemplating surgical repair of the disc prolapse sometime in January. She remains on Flexeril and NSAIDs at the moment.  Reviewed the results with her.   Leukocytosis and mild erythrocytosis is related to smoking.  Encouraged her to quit smoking.  She it appears motivated and is going to try.  No follow-up necessary unless new signs or symptoms occur.  She agrees to the plan.  With regard to vulvar neoplasia, she contineus to follow with gyn onc    PLAN:    Patient expressed  understanding and was in agreement with this plan. She also understands that She can call clinic at any time with any questions, concerns, or complaints.  No orders of the defined types were placed in this encounter.   Return if symptoms worsen or fail to improve. No matching staging information was found for the patient.  Creola Corn, MD   07/02/2016 6:42 PM

## 2016-06-15 ENCOUNTER — Ambulatory Visit: Payer: BLUE CROSS/BLUE SHIELD | Admitting: Podiatry

## 2016-09-29 ENCOUNTER — Other Ambulatory Visit: Payer: Self-pay | Admitting: Nurse Practitioner

## 2016-09-29 DIAGNOSIS — Z1231 Encounter for screening mammogram for malignant neoplasm of breast: Secondary | ICD-10-CM

## 2016-11-23 ENCOUNTER — Ambulatory Visit
Admission: RE | Admit: 2016-11-23 | Discharge: 2016-11-23 | Disposition: A | Discharge status: Home / Self Care | Payer: BLUE CROSS/BLUE SHIELD | Source: Ambulatory Visit | Attending: Nurse Practitioner | Admitting: Nurse Practitioner

## 2016-11-23 DIAGNOSIS — Z1231 Encounter for screening mammogram for malignant neoplasm of breast: Secondary | ICD-10-CM

## 2016-11-28 ENCOUNTER — Other Ambulatory Visit: Payer: Self-pay | Admitting: Neurosurgery

## 2016-11-28 DIAGNOSIS — M5030 Other cervical disc degeneration, unspecified cervical region: Secondary | ICD-10-CM

## 2016-11-29 ENCOUNTER — Ambulatory Visit
Admission: RE | Admit: 2016-11-29 | Discharge: 2016-11-29 | Disposition: A | Discharge status: Home / Self Care | Payer: BLUE CROSS/BLUE SHIELD | Source: Ambulatory Visit | Attending: Neurosurgery | Admitting: Neurosurgery

## 2016-11-29 DIAGNOSIS — M5030 Other cervical disc degeneration, unspecified cervical region: Secondary | ICD-10-CM

## 2017-01-23 ENCOUNTER — Ambulatory Visit (INDEPENDENT_AMBULATORY_CARE_PROVIDER_SITE_OTHER): Payer: BLUE CROSS/BLUE SHIELD | Admitting: Podiatry

## 2017-01-23 DIAGNOSIS — M79671 Pain in right foot: Secondary | ICD-10-CM

## 2017-01-23 DIAGNOSIS — B070 Plantar wart: Secondary | ICD-10-CM | POA: Diagnosis not present

## 2017-01-27 NOTE — Progress Notes (Signed)
   Subjective: Patient presents today with a sharp, needle-like pain and tenderness on the plantar aspect of the right great toe secondary to a plantars wart that began 1 year ago. She states the pain radiates around the plantar aspect of the right great toe. Walking increases the pain. She denies alleviating factors. She has used wart pads with no significant relief. Patient denies trauma. She is here for further evaluation and treatment.  Objective: Physical Exam General: The patient is alert and oriented x3 in no acute distress.  Dermatology: Hyperkeratotic skin lesion noted to the plantar aspect of the right foot approximately 1 cm in diameter. Pinpoint bleeding noted upon debridement. Skin is warm, dry and supple bilateral lower extremities. Negative for open lesions or macerations.  Vascular: Palpable pedal pulses bilaterally. No edema or erythema noted. Capillary refill within normal limits.  Neurological: Epicritic and protective threshold grossly intact bilaterally.   Musculoskeletal Exam: Pain on palpation to the note skin lesion.  Range of motion within normal limits to all pedal and ankle joints bilateral. Muscle strength 5/5 in all groups bilateral.   Assessment: #1 plantar wart right great toe #2 pain in right foot   Plan of Care:  #1 Patient was evaluated. #2 Excisional debridement of the plantar wart lesion was performed using a chisel blade. Cantharone was applied and the lesion was dressed with a dry sterile dressing. #3 patient is to return to clinic in 2 weeks.   Edrick Kins, DPM [facility]  Dr. Edrick Kins, [place]                                        [place], [place] [address]                Office [phone]  Fax [phone]

## 2017-02-06 ENCOUNTER — Ambulatory Visit (INDEPENDENT_AMBULATORY_CARE_PROVIDER_SITE_OTHER): Payer: BLUE CROSS/BLUE SHIELD | Admitting: Podiatry

## 2017-02-06 DIAGNOSIS — B070 Plantar wart: Secondary | ICD-10-CM | POA: Diagnosis not present

## 2017-02-09 NOTE — Progress Notes (Signed)
   Subjective: Patient presents today for follow up evaluation of a plantar wart to the right great toe. She states the area has improved significantly. She denies any new complaints at this time.  Objective: Physical Exam General: The patient is alert and oriented x3 in no acute distress.  Dermatology: Skin lesion noted to the plantar aspect of the right great toe plantar wart appears to be resolved. After debridement of the lesion there is no longer any pinpoint bleeding or any evidence of remaining viral wart. Skin is warm, dry and supple bilateral lower extremities. Negative for open lesions or macerations.  Vascular: Palpable pedal pulses bilaterally. No edema or erythema noted. Capillary refill within normal limits.  Neurological: Epicritic and protective threshold grossly intact bilaterally.   Musculoskeletal Exam: Pain on palpation to the note skin lesion.  Range of motion within normal limits to all pedal and ankle joints bilateral. Muscle strength 5/5 in all groups bilateral.   Assessment: #1 plantar wart right great toe-resolved   Plan of Care:  #1 Patient was evaluated. #2 Excisional debridement of the plantar wart lesion was performed using a tissue nipper. Cantharone was applied and the lesion was dressed with a dry sterile dressing. #3 patient is to return to clinic when necessary.   Edrick Kins, DPM [facility]  Dr. Edrick Kins, [place]                                        [place], [place] [address]                Office [phone]  Fax [phone]

## 2017-02-14 ENCOUNTER — Inpatient Hospital Stay: Payer: BLUE CROSS/BLUE SHIELD | Attending: Obstetrics and Gynecology | Admitting: Obstetrics and Gynecology

## 2017-02-14 VITALS — BP 117/79 | HR 94 | Temp 98.6°F | Resp 18 | Ht 62.0 in | Wt 139.7 lb

## 2017-02-14 DIAGNOSIS — D071 Carcinoma in situ of vulva: Secondary | ICD-10-CM | POA: Diagnosis present

## 2017-02-14 DIAGNOSIS — E785 Hyperlipidemia, unspecified: Secondary | ICD-10-CM | POA: Diagnosis not present

## 2017-02-14 DIAGNOSIS — Z79899 Other long term (current) drug therapy: Secondary | ICD-10-CM | POA: Diagnosis not present

## 2017-02-14 DIAGNOSIS — E1065 Type 1 diabetes mellitus with hyperglycemia: Secondary | ICD-10-CM | POA: Diagnosis not present

## 2017-02-14 DIAGNOSIS — E0590 Thyrotoxicosis, unspecified without thyrotoxic crisis or storm: Secondary | ICD-10-CM | POA: Diagnosis not present

## 2017-02-14 DIAGNOSIS — A630 Anogenital (venereal) warts: Secondary | ICD-10-CM | POA: Diagnosis not present

## 2017-02-14 DIAGNOSIS — Z794 Long term (current) use of insulin: Secondary | ICD-10-CM

## 2017-02-14 DIAGNOSIS — E1069 Type 1 diabetes mellitus with other specified complication: Secondary | ICD-10-CM | POA: Diagnosis not present

## 2017-02-14 DIAGNOSIS — F17210 Nicotine dependence, cigarettes, uncomplicated: Secondary | ICD-10-CM | POA: Diagnosis not present

## 2017-02-14 DIAGNOSIS — J42 Unspecified chronic bronchitis: Secondary | ICD-10-CM | POA: Diagnosis not present

## 2017-02-14 NOTE — Patient Instructions (Signed)

## 2017-02-14 NOTE — Progress Notes (Signed)
Gynecologic Oncology Interval Note  Chief Concern: Vulvar dysplasia and some vulvar discomfort on left.  Subjective:  Marcia [name] is a 46 y.o. woman who presents today for follow up of vulvar dysplasia VINII.   Complains of some vulvar itching and discomfort on the left vulva for a few weeks.  No other complaints.     Treatment History:  Prior hysterectomy   08/2009     VIN II                 laser vaporization 03/2010     VIN II                 Aldara started,well tolerated with reduced frequency, 04/2011     infection peri-clitoral area, responding to antibiotic therapy 06/2012   recurrent VIN III on biopsy of left labia minora, PAP negative. 1/14     laser vaporization of raised epithelium consistent with VIN, mainly on the anterior portion of the labia minora. A few other scattered areas around the external genitalia.  04/07/15:  Had laser ablation in OR with Dr Theora Gianotti.  "Acetowhite epithelium involving bilateral labia minora of the vulva each approximately 3 cm. On the left the abnormalities extending to the medial aspect of the labia minora. There was a another patch of acetowhite on right vulva at 3 o'clock as well as another area at the perineum. There were scattered small 1-2 mm areas of acetowhite extending toward the anus."  GYN History:   Gravida 3    Para 2    Age at Menarche 81    Regular Pap Smears Yes    History of HPV Infection    Cycle Normal  LMP 08-23-2009    Additional Hx no other gyn problems in the past   Problem List: Patient Active Problem List   Diagnosis Date Noted  . Leukocytosis 05/19/2016  . Erythrocytosis 05/19/2016  . Neck pain on left side 05/19/2016  . 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 Staphylococcal infection of skin 10/13/2014  . Furuncle of vulva 10/13/2014  . Hyperlipidemia due to type 1 diabetes mellitus ([place]) 09/12/2014  . Encounter for preventive health  examination 09/12/2014  . Onychomycosis of toenail 09/10/2014  . Type 1 diabetes mellitus with neurological manifestations, uncontrolled ([place]) 02/10/2014  . Cough 02/05/2014  . Carpal tunnel syndrome 12/17/2013  . Chronic bronchitis ([place]) 11/27/2013  . Unspecified constipation 11/11/2013  . Left wrist pain 11/07/2013  . Insomnia secondary to anxiety 07/09/2013  . History of hyperthyroidism 07/01/2013  . Degenerative TFCC tear 05/23/2013  . Hyperthyroidism 04/28/2013  . Right wrist pain 04/28/2013  . Type I diabetes mellitus ([place]) 04/28/2013  . Snoring disorder 12/26/2012  . Tobacco abuse 05/22/2011  . Tobacco abuse counseling 05/22/2011  . ABNORMAL EKG 09/10/2009  . Dyslipidemia 04/02/2006    Past Medical History: Past Medical History:  Diagnosis Date  . Cancer ([place])   . Diabetes mellitus   . Hay fever    Allergies  . Heart murmur   . History of HPV infection   . Hypercholesterolemia   . Hypothyroidism   . Myocardial infarction 1997   At age 39  . Thyroid disease   . Tobacco abuse   . Vaginal cancer ([place])   . Vulvar lesion     Past Surgical History: Past Surgical History:  Procedure Laterality Date  . ABDOMINAL HYSTERECTOMY  10/12/12  . ANKLE SURGERY  2010  . BREAST BIOPSY Bilateral @  2013   core with clips  . TUBAL LIGATION  2004  . TUBAL LIGATION  2004, [facility]  . VULVAR LESION REMOVAL  10/2009 & 05/2012  . VULVAR LESION REMOVAL N/A 04/07/2015   Procedure: VULVAR LESION;  Surgeon: Gillis Ends, MD;  Location: [facility] ORS;  Service: Gynecology;  Laterality: N/A;  with J-Plasma blade    Family History: Family History  Problem Relation Age of Onset  . Diabetes Father   . Hypertension Mother   . Hyperlipidemia Mother   . Cancer Other        Aunt-breast; grandmother- lung and ovarian  . Aneurysm Brother   . Breast cancer Neg Hx     Social History: Social History   Social History  . Marital status: Married    Spouse name: N/A  . Number of children:  N/A  . Years of education: N/A   Occupational History  . Not on file.   Social History Main Topics  . Smoking status: Current Every Day Smoker    Packs/day: 0.50    Years: 22.00    Types: Cigarettes  . Smokeless tobacco: Never Used     Comment: Down to .5ppd, trying to quit.  Allergic to chantix and e-cigs 02/05/14  . Alcohol use No  . Drug use: No  . Sexual activity: Yes    Birth control/ protection: Surgical   Other Topics Concern  . Not on file   Social History Narrative   No regular exercise.   Lives with spouse.    Allergies: Allergies  Allergen Reactions  . Chantix [Varenicline] Rash  . Septra [Sulfamethoxazole-Trimethoprim] Rash    Current Medications: Current Outpatient Prescriptions  Medication Sig Dispense Refill  . ACCU-CHEK AVIVA PLUS test strip TEST 2 TIMES A DAY 100 each 6  . ACCU-CHEK AVIVA PLUS test strip TEST 2 TIMES A DAY 100 each 6  . B-D ULTRAFINE III SHORT PEN 31G X 8 MM MISC USE TWO NEEDLES DAILY 100 each 8  . Blood Glucose Monitoring Suppl (ACCU-CHEK AVIVA PLUS) W/DEVICE KIT 1 Device by Does not apply route once. 1 kit 0  . canagliflozin (INVOKANA) 300 MG TABS tablet Take 300 mg by mouth daily before breakfast.    . citalopram (CELEXA) 10 MG tablet TAKE 1 TABLET BY MOUTH EVERY DAY 30 tablet 2  . cyclobenzaprine (FLEXERIL) 10 MG tablet Take 10 mg by mouth 3 (three) times daily as needed for muscle spasms.    . Ibuprofen-Famotidine (DUEXIS) 800-26.6 MG TABS Take 1 tablet by mouth every 8 (eight) hours.    . insulin aspart (NOVOLOG FLEXPEN) 100 UNIT/ML FlexPen 4 units with breakfast, and 4 units with the evening meal. 15 mL 11  . Insulin Glargine (LANTUS SOLOSTAR) 100 UNIT/ML Solostar Pen Inject 27 Units into the skin every morning. 5 pen PRN  . levothyroxine (SYNTHROID, LEVOTHROID) 112 MCG tablet TAKE 1 TABLET (112 MCG TOTAL) BY MOUTH DAILY. 30 tablet 1  . simvastatin (ZOCOR) 40 MG tablet Take 40 mg by mouth every evening.     No current  facility-administered medications for this visit.    Facility-Administered Medications Ordered in Other Visits  Medication Dose Route Frequency Provider Last Rate Last Dose  . lidocaine (XYLOCAINE) 2 % jelly 1 application  1 application Topical QID PRN Gillis Ends, MD        Review of Systems Pertinent items are noted in HPI.  Objective:  Physical Examination:  BP 117/79 (BP Location: Left Arm, Patient Position: Sitting)   Pulse  94   Temp 98.6 F (37 C) (Tympanic)   Resp 18   Ht _0  (1.575 m)   Wt 139 lb 11.2 oz (63.4 kg)   LMP 10/12/2012   BMI 25.55 kg/m   ECOG Performance Status: 1 - Symptomatic but completely ambulatory  General appearance: alert, cooperative and appears stated age HEENT:PERRLA, neck supple with midline trachea and thyroid without masses Lymph node survey: non-palpable, axillary, inguinal, supraclavicular Cardiovascular: regular rate and rhythm, no murmurs or gallops Respiratory: normal air entry, lungs clear to auscultation Abdomen: soft, non-tender, without masses or organomegaly, no hernias and well healed incision Back: inspection of back is normal Extremities: extremities normal, atraumatic, no cyanosis or edema Skin exam - normal coloration and turgor, no rashes, no suspicious skin lesions noted. Neurological exam reveals alert, oriented, normal speech, no focal findings or movement disorder noted.  Pelvic: exam chaperoned by nurse;  Vulva: left labia has reddish tender area.  No evidence of cellulitis.  Vagina: normal; Adnexa: normal adnexa in size, nontender and no masses; Uterus: absent; Cervix: absent;    Colposcopy of the vulva was performed with acetic acid. Consent signed and time out done. There evidence of dysplasia on the mid left labia minora with some acetowhite change.  The lesion was about 6 mm in diameter and was biopsied off completely after betadine and 2 ml of 2% lidocaine.  There were some acetowhite changes on the upper  right labia near the clitoris. Representative biopsy taken after betadine and 2 ml of 2% lidocaine.  Patient tolerated the procedure well.       Assessment:  Marcia [name] is a 46 y.o. female with a history of vulvar dysplasia VINIII s/p laser 04/07/15 with evidence of recurrence.  Smokes half a pack a day and trying to quit.  Plan:   Problem List Items Addressed This Visit    None    Visit Diagnoses    VIN III (vulvar intraepithelial neoplasia III)    -  Primary      We will contact her with results of biopsies.  I removed the lesion on the left completely and it is likely VINIII.  No further therapy will likely be indicated.  If the lesion on the right shows VINIII will need additional treatment, probably laser ablation.  Discussed importance of tobacco cessation in controlling VIN in the future.   Mellody Drown, MD  CC:  Danelle Berry, [place] [address] [place], [place] [address] [phone]

## 2017-02-14 NOTE — Progress Notes (Signed)
  Oncology Nurse Navigator Documentation Chaperoned exam. Biopsies sent to pathology. Educated on post biopsy care and provided copy in AVS. Navigator Location: [facility] (02/14/17 1500)   )Navigator Encounter Type: Follow-up Appt (02/14/17 1500)                     Patient Visit Type: GynOnc (02/14/17 1500)                              Time Spent with Patient: 30 (02/14/17 1500)

## 2017-02-14 NOTE — Progress Notes (Signed)
Patient here for VIN follow up. She states she has been experencing vaginal itiching and irritation for the past 3 weeks.

## 2017-02-15 ENCOUNTER — Other Ambulatory Visit: Payer: Self-pay | Admitting: Otolaryngology

## 2017-02-15 DIAGNOSIS — R05 Cough: Secondary | ICD-10-CM

## 2017-02-15 DIAGNOSIS — R059 Cough, unspecified: Secondary | ICD-10-CM

## 2017-02-15 LAB — SURGICAL PATHOLOGY

## 2017-02-16 LAB — SURGICAL PATHOLOGY

## 2017-02-19 NOTE — Progress Notes (Signed)
  Oncology Nurse Navigator Documentation Results of pathology from biopsies 02/14/17 sent to Dr. Fransisca Connors for review. Navigator Location: [facility] (02/19/17 1500)   )Navigator Encounter Type: Diagnostic Results (02/19/17 1500)                     Patient Visit Type: GynOnc (02/19/17 1500)                              Time Spent with Patient: 15 (02/19/17 1500)

## 2017-02-21 ENCOUNTER — Telehealth: Payer: Self-pay

## 2017-02-21 NOTE — Telephone Encounter (Signed)
Oncology Nurse Navigator Documentation Notified of biopsy report. Keep appointment with Dr. Fransisca Connors as already scheduled.  Surgical Pathology  CASE: [phone]  PATIENT: Marcia [name]  Surgical Pathology Report      SPECIMEN SUBMITTED:  A. Vulva, right anterior   CLINICAL HISTORY:  None provided   PRE-OPERATIVE DIAGNOSIS:  None provided   POST-OPERATIVE DIAGNOSIS:  None provided.      DIAGNOSIS:  A. VULVA, RIGHT ANTERIOR; BIOPSY:  - KERATOSIS AND MILD SQUAMOUS DYSPLASIA.    GROSS DESCRIPTION:   A. Labeled: right anterior vulva   Tissue fragment(s): 1   Size: 0.3 x 0.2 x 0.1 cm   Description: wrinkled gray fragment of skin/mucosa inked blue   Entirely submitted in one cassette(s).     Final Diagnosis performed by Delorse Lek, MD.  Electronically signed  02/15/2017 1:43:21PM     The electronic signature indicates that the named Attending Pathologist  has evaluated the specimen   Technical component performed at [facility], [address] [place],  [place] [address]  Lab: [phone] Dir: Darrick Penna. Evette Doffing, MD   Professional component performed at [facility], [facility],  [place], [place], [place] [address]  Lab: [phone] Dir: Dellia Nims. Reuel Derby, MD        Resulting Agency [facility]    Specimen Collected: 02/14/17 15:01 Last Resulted: 02/15/17 13:58 Lab Flowsheet Order Details View Encounter Lab and Collection Details Routing Result History          Other Results from 02/14/2017  Surgical pathology  Order: [id number]   Status:  Final result  Visible to patient:  No (Not Released)  Next appt:  02/27/2017 at 10:00 AM in Radiology ([facility])  Dx:  VIN III (vulvar intraepithelial neopl...  Newer results are available. Click to view them now.  Component 7d ago   SURGICAL PATHOLOGY Surgical Pathology  CASE: [phone]  PATIENT: Marcia [name]  Surgical Pathology Report      SPECIMEN SUBMITTED:  A. Labia,  left   CLINICAL HISTORY:  None provided   PRE-OPERATIVE DIAGNOSIS:  None provided   POST-OPERATIVE DIAGNOSIS:  None provided.      DIAGNOSIS:  A. 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  - A PERIPHERAL EDGE IS INVOLVED.   Note: Immunostain p16 is positive with a working control.    GROSS DESCRIPTION:   A. Labeled: left labia   Tissue fragment(s): 2   Size: 0.3 x 0.2 x 0.1 and 0.5 x 0.3 x 0.2 cm   Description: smaller fragment tan, large fragment tan  focally gray  fragment skin/mucosa, differentially inked and largest bisected   Entirely submitted in 1 cassette(s).     Final Diagnosis performed by Delorse Lek, MD.  Electronically signed  02/16/2017 12:39:58PM     The electronic signature indicates that the named Attending Pathologist  has evaluated the specimen   Technical component performed at [facility], [address], [place],  [place] [address]  Lab: [phone] Dir: Darrick Penna. Evette Doffing, MD   Professional component performed at [facility], [facility],  [place], [place], [place] [address]  Lab: [phone] Dir: Dellia Nims. Reuel Derby, MD       Resulting Agency [facility]    Specimen Collected: 02/14/17 14:58 Last Resulted: 02/16/17 12:55 Lab Flowsheet Order Details View Encounter Lab and Collection Details Routing Result History          Authorizing Provider Information  Name: Mellody Drown, MD Fax: [phone]  Phone: [phone] Pager:   Status  of Other Orders  Order   Lab Status Result Date Provider Status  Surgical pathology Final result 02/16/2017 Ordered    Navigator Location: [facility] On[facility] (02/21/17 1200)   )Navigator Encounter Type: Telephone;Diagnostic Results (02/21/17 1200)                     Patient Visit Type: GynOnc (02/21/17 1200)                              Time Spent with Patient: 15 (02/21/17 1200)

## 2017-02-27 ENCOUNTER — Ambulatory Visit
Admission: RE | Admit: 2017-02-27 | Discharge: 2017-02-27 | Disposition: A | Discharge status: Home / Self Care | Payer: BLUE CROSS/BLUE SHIELD | Source: Ambulatory Visit | Attending: Otolaryngology | Admitting: Otolaryngology

## 2017-03-21 ENCOUNTER — Ambulatory Visit: Payer: BLUE CROSS/BLUE SHIELD

## 2017-04-11 ENCOUNTER — Other Ambulatory Visit: Payer: Self-pay

## 2017-04-11 ENCOUNTER — Inpatient Hospital Stay: Payer: BLUE CROSS/BLUE SHIELD | Attending: Obstetrics and Gynecology | Admitting: Obstetrics and Gynecology

## 2017-04-11 ENCOUNTER — Encounter: Payer: Self-pay | Admitting: Obstetrics and Gynecology

## 2017-04-11 ENCOUNTER — Telehealth: Payer: Self-pay

## 2017-04-11 VITALS — BP 122/83 | HR 90 | Temp 97.4°F | Resp 18 | Ht 62.0 in | Wt 144.8 lb

## 2017-04-11 DIAGNOSIS — Z79899 Other long term (current) drug therapy: Secondary | ICD-10-CM | POA: Insufficient documentation

## 2017-04-11 DIAGNOSIS — D071 Carcinoma in situ of vulva: Secondary | ICD-10-CM | POA: Diagnosis not present

## 2017-04-11 DIAGNOSIS — F5105 Insomnia due to other mental disorder: Secondary | ICD-10-CM | POA: Insufficient documentation

## 2017-04-11 DIAGNOSIS — E0590 Thyrotoxicosis, unspecified without thyrotoxic crisis or storm: Secondary | ICD-10-CM | POA: Diagnosis not present

## 2017-04-11 DIAGNOSIS — F419 Anxiety disorder, unspecified: Secondary | ICD-10-CM | POA: Diagnosis not present

## 2017-04-11 DIAGNOSIS — Z794 Long term (current) use of insulin: Secondary | ICD-10-CM | POA: Insufficient documentation

## 2017-04-11 DIAGNOSIS — E785 Hyperlipidemia, unspecified: Secondary | ICD-10-CM | POA: Insufficient documentation

## 2017-04-11 DIAGNOSIS — F17210 Nicotine dependence, cigarettes, uncomplicated: Secondary | ICD-10-CM | POA: Diagnosis not present

## 2017-04-11 DIAGNOSIS — E1065 Type 1 diabetes mellitus with hyperglycemia: Secondary | ICD-10-CM | POA: Insufficient documentation

## 2017-04-11 DIAGNOSIS — E1069 Type 1 diabetes mellitus with other specified complication: Secondary | ICD-10-CM | POA: Diagnosis not present

## 2017-04-11 DIAGNOSIS — J42 Unspecified chronic bronchitis: Secondary | ICD-10-CM | POA: Diagnosis not present

## 2017-04-11 DIAGNOSIS — K5900 Constipation, unspecified: Secondary | ICD-10-CM | POA: Insufficient documentation

## 2017-04-11 DIAGNOSIS — A630 Anogenital (venereal) warts: Secondary | ICD-10-CM | POA: Diagnosis not present

## 2017-04-11 LAB — WET PREP, GENITAL
CLUE CELLS WET PREP: NONE SEEN
SPERM: NONE SEEN
TRICH WET PREP: NONE SEEN

## 2017-04-11 MED ORDER — FLUCONAZOLE 150 MG PO TABS: 150.0000 mg | ORAL_TABLET | Freq: Every day | ORAL | 0 refills | Status: DC

## 2017-04-11 MED ORDER — CLOBETASOL PROPIONATE 0.05 % EX OINT: 1.0000 "application " | TOPICAL_OINTMENT | CUTANEOUS | 3 refills | Status: DC

## 2017-04-11 NOTE — Progress Notes (Signed)
  Oncology Nurse Navigator Documentation Chaperoned pelvic exam. Vulvar biopsy sent to pathology. Wet prep sent to lab. Navigator Location: [facility] (04/11/17 1100)   )Navigator Encounter Type: Follow-up Appt (04/11/17 1100)                     Patient Visit Type: GynOnc (04/11/17 1100)                              Time Spent with Patient: 30 (04/11/17 1100)

## 2017-04-11 NOTE — Progress Notes (Signed)
Vulvar itching and irritation, tried vagisil and monistat thinking it was yeast but neither med helped with irritation.

## 2017-04-11 NOTE — Progress Notes (Addendum)
Gynecologic Oncology Interval Note  Chief Concern: Vulvar dysplasia and some vulvar discomfort on left.  Subjective:  Marcia [name] is a 46 y.o. woman who presents today for follow up of vulvar dysplasia VINII.   02/14/17 had colposcopy and vulvar biospies.   DIAGNOSIS:  VULVA, RIGHT ANTERIOR; BIOPSY:  - KERATOSIS AND MILD SQUAMOUS DYSPLASIA.  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  - A PERIPHERAL EDGE IS INVOLVED.   Complains of some continued vulvar itching and discomfort, no major discharge. She tried Monstate suppositories for a week with no improvement.  Still smoking half a pack per day of cigarettes.    Treatment History:  Prior hysterectomy   08/2009     VIN II                 laser vaporization 03/2010     VIN II                 Aldara started,well tolerated with reduced frequency, 04/2011     infection peri-clitoral area, responding to antibiotic therapy 06/2012   recurrent VIN III on biopsy of left labia minora, PAP negative. 1/14     laser vaporization of raised epithelium consistent with VIN, mainly on the anterior portion of the labia minora. A few other scattered areas around the external genitalia.  04/07/15:  Had laser ablation in OR with Dr Theora Gianotti.  "Acetowhite epithelium involving bilateral labia minora of the vulva each approximately 3 cm. On the left the abnormalities extending to the medial aspect of the labia minora. There was a another patch of acetowhite on right vulva at 3 o'clock as well as another area at the perineum. There were scattered small 1-2 mm areas of acetowhite extending toward the anus." 02/14/17   Vulvar biopsies, right VIN2 grossly removed, left keratosis.  GYN History:   Gravida 3    Para 2    Age at Menarche 65    Regular Pap Smears Yes    History of HPV Infection    Cycle Normal       Additional Hx no other gyn problems in the past   Problem List: Patient Active Problem List   Diagnosis Date Noted  . Leukocytosis 05/19/2016  . Erythrocytosis 05/19/2016  . Neck pain on left side 05/19/2016  . 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 Staphylococcal infection of skin 10/13/2014  . Furuncle of vulva 10/13/2014  . Hyperlipidemia due to type 1 diabetes mellitus ([place]) 09/12/2014  . Encounter for preventive health examination 09/12/2014  . Onychomycosis of toenail 09/10/2014  . Type 1 diabetes mellitus with neurological manifestations, uncontrolled ([place]) 02/10/2014  . Cough 02/05/2014  . Carpal tunnel syndrome 12/17/2013  . Chronic bronchitis ([place]) 11/27/2013  . Unspecified constipation 11/11/2013  . Left wrist pain 11/07/2013  . Insomnia secondary to anxiety 07/09/2013  . History of hyperthyroidism 07/01/2013  . Degenerative TFCC tear 05/23/2013  . Hyperthyroidism 04/28/2013  . Right wrist pain 04/28/2013  . Type I diabetes mellitus ([place]) 04/28/2013  . Snoring disorder 12/26/2012  . Tobacco abuse 05/22/2011  . Tobacco abuse counseling 05/22/2011  . ABNORMAL EKG 09/10/2009  . Dyslipidemia 04/02/2006    Past Medical History: Past Medical History:  Diagnosis Date  . Cancer ([place])   . Diabetes mellitus   . Hay fever    Allergies  . Heart murmur   . History of HPV infection   . Hypercholesterolemia   .  Hypothyroidism   . Myocardial infarction ([place]) 1997   At age 83  . Thyroid disease   . Tobacco abuse   . Vaginal cancer ([place])   . Vulvar lesion     Past Surgical History: Past Surgical History:  Procedure Laterality Date  . ABDOMINAL HYSTERECTOMY  10/12/12  . ANKLE SURGERY  2010  . BREAST BIOPSY Bilateral @ 2013   core with clips  . TUBAL LIGATION  2004  . TUBAL LIGATION  2004, [facility]  . VULVAR LESION REMOVAL  10/2009 & 05/2012  . VULVAR LESION REMOVAL N/A 04/07/2015   Procedure: VULVAR LESION;  Surgeon: Gillis Ends, MD;  Location: [facility] ORS;  Service: Gynecology;  Laterality: N/A;   with J-Plasma blade    Family History: Family History  Problem Relation Age of Onset  . Diabetes Father   . Hypertension Mother   . Hyperlipidemia Mother   . Cancer Other        Aunt-breast; grandmother- lung and ovarian  . Aneurysm Brother   . Breast cancer Neg Hx     Social History: Social History   Social History  . Marital status: Married    Spouse name: N/A  . Number of children: N/A  . Years of education: N/A   Occupational History  . Not on file.   Social History Main Topics  . Smoking status: Current Every Day Smoker    Packs/day: 0.50    Years: 22.00    Types: Cigarettes  . Smokeless tobacco: Never Used     Comment: Down to .5ppd, trying to quit.  Allergic to chantix and e-cigs 02/05/14  . Alcohol use No  . Drug use: No  . Sexual activity: Yes    Birth control/ protection: Surgical   Other Topics Concern  . Not on file   Social History Narrative   No regular exercise.   Lives with spouse.    Allergies: Allergies  Allergen Reactions  . Chantix [Varenicline] Rash  . Septra [Sulfamethoxazole-Trimethoprim] Rash    Current Medications: Current Outpatient Prescriptions  Medication Sig Dispense Refill  . ACCU-CHEK AVIVA PLUS test strip TEST 2 TIMES A DAY 100 each 6  . ACCU-CHEK AVIVA PLUS test strip TEST 2 TIMES A DAY 100 each 6  . B-D ULTRAFINE III SHORT PEN 31G X 8 MM MISC USE TWO NEEDLES DAILY 100 each 8  . Blood Glucose Monitoring Suppl (ACCU-CHEK AVIVA PLUS) W/DEVICE KIT 1 Device by Does not apply route once. 1 kit 0  . canagliflozin (INVOKANA) 300 MG TABS tablet Take 300 mg by mouth daily before breakfast.    . cyclobenzaprine (FLEXERIL) 10 MG tablet Take 10 mg by mouth 3 (three) times daily as needed for muscle spasms.    . Ibuprofen-Famotidine (DUEXIS) 800-26.6 MG TABS Take 1 tablet by mouth every 8 (eight) hours.    . insulin aspart (NOVOLOG FLEXPEN) 100 UNIT/ML FlexPen 4 units with breakfast, and 4 units with the evening meal. 15 mL 11  .  Insulin Glargine (LANTUS SOLOSTAR) 100 UNIT/ML Solostar Pen Inject 27 Units into the skin every morning. 5 pen PRN  . levothyroxine (SYNTHROID, LEVOTHROID) 112 MCG tablet TAKE 1 TABLET (112 MCG TOTAL) BY MOUTH DAILY. 30 tablet 1  . simvastatin (ZOCOR) 40 MG tablet Take 40 mg by mouth every evening.     No current facility-administered medications for this visit.    Facility-Administered Medications Ordered in Other Visits  Medication Dose Route Frequency Provider Last Rate Last Dose  . lidocaine (XYLOCAINE) 2 %  jelly 1 application  1 application Topical QID PRN Gillis Ends, MD        Review of Systems Pertinent items are noted in HPI.  Objective:  Physical Examination:  BP 122/83   Pulse 90   Temp (!) 97.4 F (36.3 C) (Tympanic)   Resp 18   Ht '5\' 2"'$  (1.575 m)   Wt 144 lb 12.8 oz (65.7 kg)   LMP 10/12/2012   BMI 26.48 kg/m   ECOG Performance Status: 1 - Symptomatic but completely ambulatory  General appearance: alert, cooperative and appears stated age HEENT:PERRLA, neck supple with midline trachea and thyroid without masses Lymph node survey: non-palpable, axillary, inguinal, supraclavicular Cardiovascular: regular rate and rhythm, no murmurs or gallops Respiratory: normal air entry, lungs clear to auscultation Abdomen: soft, non-tender, without masses or organomegaly, no hernias and well healed incision Back: inspection of back is normal Extremities: extremities normal, atraumatic, no cyanosis or edema Skin exam - normal coloration and turgor, no rashes, no suspicious skin lesions noted. Neurological exam reveals alert, oriented, normal speech, no focal findings or movement disorder noted.  Pelvic: exam chaperoned by nurse;  Vulva: well healed from biopsies.   New small wart near introitus at 7 o'clock. Vagina: normal, mild discharge; Adnexa: normal adnexa in size, nontender and no masses; Uterus: absent; Cervix: absent;    Vulvar wart of about 7 mm was removed  with biopsy forceps after infiltration with Xylocaine 2%, 2 ml. Consent signed and time out done.  Patient tolerated the procedure well.       Wet prep is positive for yeast per the lab. Negative for trich and Clue cells.  Assessment:  Marcia [name] is a 46 y.o. 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  Smokes half a pack a day and trying to quit.  Plan:   Problem List Items Addressed This Visit    None    Visit Diagnoses    VIN III (vulvar intraepithelial neoplasia III)    -  Primary      We will contact her with results of vulvar wart removal,  Wet prep of vagina showed yeast and will treat her with DIflucan 150 mg qd x 3 days in view of positive wet prep.  She will hold her statin while taking Diflucan in view of potential interaction.  If this does not help will try Clobetasol ointment every other day before bed time for symptomatic relief of itching as this is a long standing problem.   Discussed importance of tobacco cessation in controlling VIN in the future.  RTC 6 months.   Mellody Drown, MD  CC:  Danelle Berry, [place] [address] [place], [place] [address] [phone]

## 2017-04-11 NOTE — Telephone Encounter (Signed)
  Oncology Nurse Navigator Documentation Marcia [name] instructed to take Diflucan for 3 days for yeast. During that time she is to stop taking her Simvastatin for 3 days. If itching does not improve she can then use Clobetasol as she was directed.     )

## 2017-04-11 NOTE — Patient Instructions (Signed)
Clobetasol Propionate Topical Ointment What is this medicine? CLOBETASOL (kloe BAY ta sol) is a corticosteroid. It is used on the skin to treat itching, redness, and swelling caused by some skin conditions. This medicine may be used for other purposes; ask your health care provider or pharmacist if you have questions. COMMON BRAND NAME(S): Cormax, Embeline, Temovate, Temovate E What should I tell my health care provider before I take this medicine? They need to know if you have any of these conditions: -any type of active infection including measles, tuberculosis, herpes, or chickenpox -circulation problems or vascular disease -large areas of burned or damaged skin -rosacea -skin wasting or thinning -an unusual or allergic reaction to clobetasol, corticosteroids, other medicines, foods, dyes, or preservatives -pregnant or trying to get pregnant -breast-feeding How should I use this medicine? This medicine is for external use only. Do not take by mouth. Follow the directions on the prescription label. Wash your hands before and after use. Apply a thin film of medicine to the affected area. Do not cover with a bandage or dressing unless your doctor or health care professional tells you to. Do not get this medicine in your eyes. If you do, rinse out with plenty of cool tap water. It is important not to use more medicine than prescribed. Do not use your medicine more often than directed. To do so may increase the chance of side effects. Talk to your pediatrician regarding the use of this medicine in children. Special care may be needed. Elderly patients are more likely to have damaged skin through aging, and this may increase side effects. This medicine should only be used for brief periods and infrequently in older patients. Overdosage: If you think you have taken too much of this medicine contact a poison control center or emergency room at once. NOTE: This medicine is only for you. Do not share  this medicine with others. What if I miss a dose? If you miss a dose, use it as soon as you can. If it is almost time for your next dose, use only that dose. Do not use double or extra doses without advice. What may interact with this medicine? Interactions are not expected. Do not use cosmetics or other skin care products on the treated area. This list may not describe all possible interactions. Give your health care provider a list of all the medicines, herbs, non-prescription drugs, or dietary supplements you use. Also tell them if you smoke, drink alcohol, or use illegal drugs. Some items may interact with your medicine. What should I watch for while using this medicine? Tell your doctor or health care professional if your symptoms do not get better within 2 weeks, or if you develop skin irritation from the medicine. Tell your doctor or health care professional if you are exposed to anyone with measles or chickenpox, or if you develop sores or blisters that do not heal properly. What side effects may I notice from receiving this medicine? Side effects that you should report to your doctor or health care professional as soon as possible: -allergic reactions like skin rash, itching or hives, swelling of the face, lips, or tongue -changes in vision -lack of healing of the skin condition -painful, red, pus filled blisters on the skin or in hair follicles -thinning of the skin with easy bruising Side effects that usually do not require medical attention (report to your doctor or health care professional if they continue or are bothersome): -burning, irritation of the skin -redness or  scaling of the skin This list may not describe all possible side effects. Call your doctor for medical advice about side effects. You may report side effects to FDA at 1-800-FDA-1088. Where should I keep my medicine? Keep out of the reach of children. Store at room temperature between 15 and 30 degrees C (59 and 86  degrees F). Keep away from heat and direct light. Do not freeze. Throw away any unused medicine after the expiration date. NOTE: This sheet is a summary. It may not cover all possible information. If you have questions about this medicine, talk to your doctor, pharmacist, or health care provider.  2018 Elsevier/Gold Standard (2007-11-06 17:22:39)

## 2017-04-12 ENCOUNTER — Encounter: Payer: Self-pay | Admitting: Internal Medicine

## 2017-04-12 ENCOUNTER — Ambulatory Visit (INDEPENDENT_AMBULATORY_CARE_PROVIDER_SITE_OTHER): Payer: BLUE CROSS/BLUE SHIELD | Admitting: Internal Medicine

## 2017-04-12 VITALS — BP 116/76 | HR 91 | Resp 16 | Ht 62.0 in | Wt 145.0 lb

## 2017-04-12 DIAGNOSIS — Z720 Tobacco use: Secondary | ICD-10-CM | POA: Diagnosis not present

## 2017-04-12 DIAGNOSIS — R059 Cough, unspecified: Secondary | ICD-10-CM

## 2017-04-12 DIAGNOSIS — R05 Cough: Secondary | ICD-10-CM

## 2017-04-12 LAB — SURGICAL PATHOLOGY

## 2017-04-12 NOTE — Progress Notes (Addendum)
[facility] Consultation      Assessment and Plan:  Intractable cough with acute respiratory disorder. -Appears to originate in her throat, the etiology is uncertain. Cough is been present for 2 years and has not responded to usual empiric therapies as detailed below. --Will try empiric claritin-D which has not been tried yet, will try this for 2 weeks. She has been asked to call back with progress. If cough has not resolved, will check CT chest and bronchoscopy. We have discussed possible trial with other medications (such as amitriptyline) afterwards.   Dysphagia. -Patient describes episodes of choking eating or drinking, which occur daily. She has had a negative swallow evaluation. -Could consider an atypical presentation of vocal cord dysfunction which could be causing her problems.  Nicotine abuse. -Discussed possibility of smoking, continuing to her cough. Spent greater than 3 minutes and discussions  Addendum 05/18/17. CT images personally reviewed, there are tiny lung nodules in the right lung, a 6 mm or less. There is no evident cause of her coughing, no significant other abnormalities are noted. The patient continues to cough, we will plan for a diagnostic bronchoscopy for airways inspection. 05/18/2017 -DR  Date: 04/12/2017  MRN# [id number] Marcia [name] 11-15-1970    Marcia [name] is a 46 y.o. old female seen in consultation for chief complaint of:    Chief Complaint  Patient presents with  . Advice Only    referred by Dr. Pryor Ochoa ENT patient is former patient of [facility]  . Cough    HPI:   The patient is a 46 year old female referred for persistent cough. She was recently seen by Dr. Pryor Ochoa, ENT in approximately 2 months ago. The cough is apparently been present for 2 years, occurs mostly at night and after drinking liquids. She was seen by allergy, undergone allergy testing, and is on Singulair and Nasonex. Her allergy testing was negative.  I do  not have access to the patient's imaging, she is apparently had a chest x-ray which was normal, per records., And was tried on multiple inhalers (spiriva, advair, ventolin, proventil, atrovent, which apparently were not helpful.  She apparently had a swallow study done which was normal and was on nexium singulair, xyzal, tessalon, cough syrup, nasal spray, cough drops which were not helpful. Prednisone has not been helpful.  When she was seen by ENT she had a scope which was apparently normal.   Sometimes when she eats or drinks she will choke and start coughing. This happens about once per day. She has no dyspnea. The cough does wake her from sleep.   She describes the cough as originating in her throat. She has 3 dogs, and has had them for 5 years, they are not in bedroom.  She has always lived in this area. 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 She has never lived or worked on a farm.  **Spirometry 04/12/2017; FVC is 94% predicted, FEV1 is 84% predicted, ratio is 73%. No bronchodilator was given. Overall test is normal Spirometry 11/24/2013; FVC 92%, FEV1 94%, ratio 79%, no significant changes with bronchodilator. **ABG 11/24/2013; 7.4/36/69/22.3 on room air; normal **CBC 05/19/2016; Eos = 0.   PMHX:   Past Medical History:  Diagnosis Date  . Cancer ([place])   . Diabetes mellitus   . Hay fever    Allergies  . Heart murmur   . History of HPV infection   . Hypercholesterolemia   . Hypothyroidism   . Myocardial  infarction ([place]) 1997   At age 89  . Thyroid disease   . Tobacco abuse   . Vaginal cancer ([place])   . Vulvar lesion    Surgical Hx:  Past Surgical History:  Procedure Laterality Date  . ABDOMINAL HYSTERECTOMY  10/12/12  . ANKLE SURGERY  2010  . BREAST BIOPSY Bilateral @ 2013   core with clips  . TUBAL LIGATION  2004  . TUBAL LIGATION  2004, [facility]  . VULVAR LESION REMOVAL  10/2009 & 05/2012  . VULVAR LESION REMOVAL N/A 04/07/2015   Procedure: VULVAR  LESION;  Surgeon: Gillis Ends, MD;  Location: [facility] ORS;  Service: Gynecology;  Laterality: N/A;  with J-Plasma blade   Family Hx:  Family History  Problem Relation Age of Onset  . Diabetes Father   . Hypertension Mother   . Hyperlipidemia Mother   . Cancer Other        Aunt-breast; grandmother- lung and ovarian  . Aneurysm Brother   . Breast cancer Neg Hx    Social Hx:   Social History  Substance Use Topics  . Smoking status: Current Every Day Smoker    Packs/day: 0.50    Years: 22.00    Types: Cigarettes  . Smokeless tobacco: Never Used     Comment: Down to .5ppd, trying to quit.  Allergic to chantix and e-cigs 02/05/14  . Alcohol use No   Medication:    Current Outpatient Prescriptions:  .  ACCU-CHEK AVIVA PLUS test strip, TEST 2 TIMES A DAY, Disp: 100 each, Rfl: 6 .  ACCU-CHEK AVIVA PLUS test strip, TEST 2 TIMES A DAY, Disp: 100 each, Rfl: 6 .  B-D ULTRAFINE III SHORT PEN 31G X 8 MM MISC, USE TWO NEEDLES DAILY, Disp: 100 each, Rfl: 8 .  Blood Glucose Monitoring Suppl (ACCU-CHEK AVIVA PLUS) W/DEVICE KIT, 1 Device by Does not apply route once., Disp: 1 kit, Rfl: 0 .  canagliflozin (INVOKANA) 300 MG TABS tablet, Take 300 mg by mouth daily before breakfast., Disp: , Rfl:  .  clobetasol ointment (TEMOVATE) 4.97 %, Apply 1 application topically every other day. At night, Disp: 30 g, Rfl: 3 .  cyclobenzaprine (FLEXERIL) 10 MG tablet, Take 10 mg by mouth 3 (three) times daily as needed for muscle spasms., Disp: , Rfl:  .  Ibuprofen-Famotidine (DUEXIS) 800-26.6 MG TABS, Take 1 tablet by mouth every 8 (eight) hours., Disp: , Rfl:  .  insulin aspart (NOVOLOG FLEXPEN) 100 UNIT/ML FlexPen, 4 units with breakfast, and 4 units with the evening meal., Disp: 15 mL, Rfl: 11 .  Insulin Glargine (LANTUS SOLOSTAR) 100 UNIT/ML Solostar Pen, Inject 27 Units into the skin every morning., Disp: 5 pen, Rfl: PRN .  levothyroxine (SYNTHROID, LEVOTHROID) 112 MCG tablet, TAKE 1 TABLET (112 MCG  TOTAL) BY MOUTH DAILY., Disp: 30 tablet, Rfl: 1 .  simvastatin (ZOCOR) 40 MG tablet, Take 40 mg by mouth every evening., Disp: , Rfl:  No current facility-administered medications for this visit.   Facility-Administered Medications Ordered in Other Visits:  .  lidocaine (XYLOCAINE) 2 % jelly 1 application, 1 application, Topical, QID PRN, Gillis Ends, MD   Allergies:  Chantix [varenicline] and Septra [sulfamethoxazole-trimethoprim]  Review of Systems: Gen:  Denies  fever, sweats, chills HEENT: Denies blurred vision, double vision. bleeds, sore throat Cvc:  No dizziness, chest pain. Resp:   Deniessputum production, shortness of breath Gi: Denies swallowing difficulty, stomach pain. Gu:  Denies bladder incontinence, burning urine Ext:   No Joint pain,  stiffness. Skin: No skin rash,  hives  Endoc:  No polyuria, polydipsia. Psych: No depression, insomnia. Other:  All other systems were reviewed with the patient and were negative other that what is mentioned in the HPI.   Physical Examination:   VS: BP 116/76 (BP Location: Left Arm, Cuff Size: Normal)   Pulse 91   Resp 16   Ht _0  (1.575 m)   Wt 145 lb (65.8 kg)   LMP 10/12/2012   SpO2 98%   BMI 26.52 kg/m   General Appearance: No distress  Neuro:without focal findings,  speech normal,  HEENT: PERRLA, EOM intact.   copious wax Pulmonary: normal breath sounds, No wheezing.  CardiovascularNormal S1,S2.  No m/r/g.   Abdomen: Benign, Soft, non-tender. Renal:  No costovertebral tenderness  GU:  No performed at this time. Endoc: No evident thyromegaly, no signs of acromegaly. Skin:   warm, no rashes, no ecchymosis  Extremities: normal, no cyanosis, clubbing.  Other findings:    LABORATORY PANEL:   CBC No results for input(s): WBC, HGB, HCT, PLT in the last 168 hours. ------------------------------------------------------------------------------------------------------------------  Chemistries  No results  for input(s): NA, K, CL, CO2, GLUCOSE, BUN, CREATININE, CALCIUM, MG, AST, ALT, ALKPHOS, BILITOT in the last 168 hours.  Invalid input(s): GFRCGP ------------------------------------------------------------------------------------------------------------------  Cardiac Enzymes No results for input(s): TROPONINI in the last 168 hours. ------------------------------------------------------------  RADIOLOGY:  No results found.     Thank  you for the consultation and for allowing [facility] to assist in the care of your patient. Our recommendations are noted above.  Please contact us if we can be of further service.   Marda Stalker, MD.  Board Certified in Internal Medicine, Pulmonary Medicine, [place], and Sleep Medicine.  [facility] Office Number: [phone]  Patricia Pesa, M.D.  Merton Border, M.D  04/12/2017

## 2017-04-12 NOTE — Patient Instructions (Addendum)
--  Start taking claritin-D once daily for 2 weeks call after 2 weeks to let us know if it worked. If improved will cancel CT.   --CT chest in 1 month.  --After CT scan will plan for bronchoscopy.

## 2017-04-16 ENCOUNTER — Telehealth: Payer: Self-pay

## 2017-04-16 NOTE — Telephone Encounter (Signed)
  Oncology Nurse Navigator Documentation Notified of Biopsy results. Keep follow up appt in 6 months with Dr. Fransisca Connors. Reports itching is improved after taking Diflucan. Starting ointment this evening.  Surgical pathology  Order: [id number]  Status:  Final result  Visible to patient:  No (Not Released)  Next appt:  05/14/2017 at 09:00 AM in Radiology ([facility])  Dx:  VIN III (vulvar intraepithelial neopl...  Component 5d ago   SURGICAL PATHOLOGY Surgical Pathology  CASE: [id number]  PATIENT: Marcia [name]  Surgical Pathology Report      SPECIMEN SUBMITTED:  A. Vulvar wart, right   CLINICAL HISTORY:  History of vulvar dysplasia   PRE-OPERATIVE DIAGNOSIS:  None provided   POST-OPERATIVE DIAGNOSIS:  None provided.      DIAGNOSIS:  A. VULVA, RIGHT; BIOPSY/REMOVAL:  - LOW-GRADE SQUAMOUS INTRAEPITHELIAL LESION (CONDYLOMA).  - CANDIDIASIS.  - NEGATIVE FOR HIGH-GRADE SQUAMOUS INTRAEPITHELIAL LESION AND  MALIGNANCY.   Comment:  There are focal intraepithelial neutrophils, and PAS stain for fungi  demonstrates a few intraepithelial pseudohyphae.    GROSS DESCRIPTION:  A. Labeled: right vulvar wart  Tissue fragment(s): 4  Size: 0.1-0.3 cm  Description: pink to tan fragments, marked blue   Entirely submitted in 1 cassette(s).   Final Diagnosis performed by Bryan Lemma, MD.  Electronically signed  04/12/2017 4:44:22PM             Navigator Location: [facility] (04/16/17 0900)   )Navigator Encounter Type: Telephone;Diagnostic Results (04/16/17 0900)                     Patient Visit Type: GynOnc (04/16/17 0900)                              Time Spent with Patient: 15 (04/16/17 0900)

## 2017-05-09 ENCOUNTER — Emergency Department
Admission: EM | Admit: 2017-05-09 | Discharge: 2017-05-09 | Disposition: A | Discharge status: Home / Self Care | Payer: BLUE CROSS/BLUE SHIELD | Attending: Emergency Medicine | Admitting: Emergency Medicine

## 2017-05-09 ENCOUNTER — Emergency Department: Payer: BLUE CROSS/BLUE SHIELD

## 2017-05-09 DIAGNOSIS — R102 Pelvic and perineal pain: Secondary | ICD-10-CM | POA: Insufficient documentation

## 2017-05-09 DIAGNOSIS — E109 Type 1 diabetes mellitus without complications: Secondary | ICD-10-CM | POA: Insufficient documentation

## 2017-05-09 DIAGNOSIS — E039 Hypothyroidism, unspecified: Secondary | ICD-10-CM | POA: Diagnosis not present

## 2017-05-09 DIAGNOSIS — Z8544 Personal history of malignant neoplasm of other female genital organs: Secondary | ICD-10-CM | POA: Diagnosis not present

## 2017-05-09 DIAGNOSIS — Z79899 Other long term (current) drug therapy: Secondary | ICD-10-CM | POA: Insufficient documentation

## 2017-05-09 DIAGNOSIS — F17210 Nicotine dependence, cigarettes, uncomplicated: Secondary | ICD-10-CM | POA: Diagnosis not present

## 2017-05-09 DIAGNOSIS — N83209 Unspecified ovarian cyst, unspecified side: Secondary | ICD-10-CM

## 2017-05-09 DIAGNOSIS — N8302 Follicular cyst of left ovary: Secondary | ICD-10-CM | POA: Insufficient documentation

## 2017-05-09 DIAGNOSIS — Z794 Long term (current) use of insulin: Secondary | ICD-10-CM | POA: Diagnosis not present

## 2017-05-09 DIAGNOSIS — N83202 Unspecified ovarian cyst, left side: Secondary | ICD-10-CM

## 2017-05-09 HISTORY — DX: Unspecified ovarian cyst, unspecified side: N83.209

## 2017-05-09 LAB — CBC
HCT: 48 % — ABNORMAL HIGH (ref 35.0–47.0)
Hemoglobin: 16.7 g/dL — ABNORMAL HIGH (ref 12.0–16.0)
MCH: 32.1 pg (ref 26.0–34.0)
MCHC: 34.8 g/dL (ref 32.0–36.0)
MCV: 92.4 fL (ref 80.0–100.0)
PLATELETS: 262 10*3/uL (ref 150–440)
RBC: 5.19 MIL/uL (ref 3.80–5.20)
RDW: 13.4 % (ref 11.5–14.5)
WBC: 13.4 10*3/uL — AB (ref 3.6–11.0)

## 2017-05-09 LAB — URINE DRUG SCREEN, QUALITATIVE (ARMC ONLY)
Amphetamines, Ur Screen: NOT DETECTED
BARBITURATES, UR SCREEN: NOT DETECTED
BENZODIAZEPINE, UR SCRN: NOT DETECTED
CANNABINOID 50 NG, UR ~~LOC~~: NOT DETECTED
Cocaine Metabolite,Ur ~~LOC~~: NOT DETECTED
MDMA (ECSTASY) UR SCREEN: NOT DETECTED
Methadone Scn, Ur: NOT DETECTED
Opiate, Ur Screen: NOT DETECTED
PHENCYCLIDINE (PCP) UR S: NOT DETECTED
TRICYCLIC, UR SCREEN: NOT DETECTED

## 2017-05-09 LAB — URINALYSIS, COMPLETE (UACMP) WITH MICROSCOPIC
BILIRUBIN URINE: NEGATIVE
Hgb urine dipstick: NEGATIVE
Ketones, ur: NEGATIVE mg/dL
LEUKOCYTES UA: NEGATIVE
NITRITE: NEGATIVE
PH: 5 (ref 5.0–8.0)
Protein, ur: NEGATIVE mg/dL
SPECIFIC GRAVITY, URINE: 1.041 — AB (ref 1.005–1.030)

## 2017-05-09 LAB — COMPREHENSIVE METABOLIC PANEL
ALBUMIN: 4.2 g/dL (ref 3.5–5.0)
ALK PHOS: 57 U/L (ref 38–126)
ALT: 20 U/L (ref 14–54)
AST: 30 U/L (ref 15–41)
Anion gap: 9 (ref 5–15)
BILIRUBIN TOTAL: 0.8 mg/dL (ref 0.3–1.2)
BUN: 16 mg/dL (ref 6–20)
CALCIUM: 9 mg/dL (ref 8.9–10.3)
CO2: 25 mmol/L (ref 22–32)
CREATININE: 0.85 mg/dL (ref 0.44–1.00)
Chloride: 105 mmol/L (ref 101–111)
GFR calc Af Amer: >60 mL/min (ref >60)
GFR calc non Af Amer: >60 mL/min (ref >60)
GLUCOSE: 158 mg/dL — AB (ref 65–99)
Potassium: 3.8 mmol/L (ref 3.5–5.1)
Sodium: 139 mmol/L (ref 135–145)
TOTAL PROTEIN: 7.4 g/dL (ref 6.5–8.1)

## 2017-05-09 LAB — LIPASE, BLOOD: Lipase: 25 U/L (ref 11–51)

## 2017-05-09 MED ORDER — MORPHINE SULFATE (PF) 4 MG/ML IV SOLN
4.0000 mg | Freq: Once | INTRAVENOUS | Status: AC
  Administered 2017-05-09: 4 mg via INTRAVENOUS
  Filled 2017-05-09: qty 1

## 2017-05-09 MED ORDER — OXYCODONE-ACETAMINOPHEN 5-325 MG PO TABS: 1.0000 | ORAL_TABLET | Freq: Four times a day (QID) | ORAL | 0 refills | Status: DC | PRN

## 2017-05-09 MED ORDER — ONDANSETRON HCL 4 MG PO TABS: 4.0000 mg | ORAL_TABLET | Freq: Every day | ORAL | 0 refills | Status: DC | PRN

## 2017-05-09 MED ORDER — SODIUM CHLORIDE 0.9 % IV BOLUS (SEPSIS)
1000.0000 mL | Freq: Once | INTRAVENOUS | Status: AC
  Administered 2017-05-09: 1000 mL via INTRAVENOUS

## 2017-05-09 MED ORDER — ONDANSETRON HCL 4 MG/2ML IJ SOLN
4.0000 mg | Freq: Once | INTRAMUSCULAR | Status: AC
  Administered 2017-05-09: 4 mg via INTRAVENOUS
  Filled 2017-05-09: qty 2

## 2017-05-09 NOTE — ED Notes (Signed)
Patient nauseated, provided emesis bag to patient

## 2017-05-09 NOTE — ED Notes (Signed)
Patient discharged to home per MD order. Patient in stable condition, and deemed medically cleared by ED provider for discharge. Discharge instructions reviewed with patient/family using "Teach Back"; verbalized understanding of medication education and administration, and information about follow-up care. Denies further concerns. ° °

## 2017-05-09 NOTE — ED Provider Notes (Signed)
[facility] Emergency Department Provider Note  ____________________________________________   I have reviewed the triage vital signs and the nursing notes.   HISTORY  Chief Complaint Abdominal Pain    HPI Marcia [name] is a 46 y.o. female  with multiple medical problems in the past including hysterectomy, oophorectomy, diabetes mellitus, anxiety, insomnia, ongoing tobacco abuse, HPV, thyroid issues etc. presents today complaining of lower abdominal pain on the left which is reading towards the left flank and down towards her groin. Sudden onset. Happened at rest. Nothing makes it better nothing makes it worse. Positive nausea, no fever or chills. would like to drink something. no prior treatment.  Past Medical History:  Diagnosis Date  . Cancer ([place])   . Diabetes mellitus   . Hay fever    Allergies  . Heart murmur   . History of HPV infection   . Hypercholesterolemia   . Hypothyroidism   . Myocardial infarction ([place]) 1997   At age 5  . Thyroid disease   . Tobacco abuse   . Vaginal cancer ([place])   . Vulvar lesion     Patient Active Problem List   Diagnosis Date Noted  . Leukocytosis 05/19/2016  . Erythrocytosis 05/19/2016  . Neck pain on left side 05/19/2016  . 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 Staphylococcal infection of skin 10/13/2014  . Furuncle of vulva 10/13/2014  . Hyperlipidemia due to type 1 diabetes mellitus ([place]) 09/12/2014  . Encounter for preventive health examination 09/12/2014  . Onychomycosis of toenail 09/10/2014  . Type 1 diabetes mellitus with neurological manifestations, uncontrolled ([place]) 02/10/2014  . Cough 02/05/2014  . Carpal tunnel syndrome 12/17/2013  . Chronic bronchitis ([place]) 11/27/2013  . Unspecified constipation 11/11/2013  . Left wrist pain 11/07/2013  . Insomnia secondary to anxiety 07/09/2013  . History of hyperthyroidism 07/01/2013  .  Degenerative TFCC tear 05/23/2013  . Hyperthyroidism 04/28/2013  . Right wrist pain 04/28/2013  . Type I diabetes mellitus ([place]) 04/28/2013  . Snoring disorder 12/26/2012  . Tobacco abuse 05/22/2011  . Tobacco abuse counseling 05/22/2011  . ABNORMAL EKG 09/10/2009  . Dyslipidemia 04/02/2006    Past Surgical History:  Procedure Laterality Date  . ABDOMINAL HYSTERECTOMY  10/12/12  . ANKLE SURGERY  2010  . BREAST BIOPSY Bilateral @ 2013   core with clips  . TUBAL LIGATION  2004  . TUBAL LIGATION  2004, [facility]  . VULVAR LESION REMOVAL  10/2009 & 05/2012  . VULVAR LESION REMOVAL N/A 04/07/2015   Procedure: VULVAR LESION;  Surgeon: Gillis Ends, MD;  Location: [facility] ORS;  Service: Gynecology;  Laterality: N/A;  with J-Plasma blade    Prior to Admission medications   Medication Sig Start Date End Date Taking? Authorizing Provider  ACCU-CHEK AVIVA PLUS test strip TEST 2 TIMES A DAY 06/07/15   Renato Shin, MD  ACCU-CHEK AVIVA PLUS test strip TEST 2 TIMES A DAY 07/05/15   Renato Shin, MD  B-D ULTRAFINE III SHORT PEN 31G X 8 MM MISC USE TWO NEEDLES DAILY 05/10/15   Renato Shin, MD  Blood Glucose Monitoring Suppl (ACCU-CHEK AVIVA PLUS) W/DEVICE KIT 1 Device by Does not apply route once. 05/26/14   Renato Shin, MD  canagliflozin (INVOKANA) 300 MG TABS tablet Take 300 mg by mouth daily before breakfast.    [provider]  clobetasol ointment (TEMOVATE) 9.02 % Apply 1 application topically every other day. At night 04/11/17   Mellody Drown,  MD  cyclobenzaprine (FLEXERIL) 10 MG tablet Take 10 mg by mouth 3 (three) times daily as needed for muscle spasms.    [provider]  Ibuprofen-Famotidine (DUEXIS) 800-26.6 MG TABS Take 1 tablet by mouth every 8 (eight) hours.    [provider]  insulin aspart (NOVOLOG FLEXPEN) 100 UNIT/ML FlexPen 4 units with breakfast, and 4 units with the evening meal. 12/14/14   Romero Belling, MD  Insulin Glargine (LANTUS SOLOSTAR) 100  UNIT/ML Solostar Pen Inject 27 Units into the skin every morning. 09/15/14   Romero Belling, MD  levothyroxine (SYNTHROID, LEVOTHROID) 112 MCG tablet TAKE 1 TABLET (112 MCG TOTAL) BY MOUTH DAILY. 09/01/14   Sherlene Shams, MD  simvastatin (ZOCOR) 40 MG tablet Take 40 mg by mouth every evening.    [provider]    Allergies Chantix [varenicline] and Septra [sulfamethoxazole-trimethoprim]  Family History  Problem Relation Age of Onset  . Diabetes Father   . Hypertension Mother   . Hyperlipidemia Mother   . Cancer Other        Aunt-breast; grandmother- lung and ovarian  . Aneurysm Brother   . Breast cancer Neg Hx     Social History Social History  Substance Use Topics  . Smoking status: Current Every Day Smoker    Packs/day: 0.50    Years: 22.00    Types: Cigarettes  . Smokeless tobacco: Never Used     Comment: Down to .5ppd, trying to quit.  Allergic to chantix and e-cigs 02/05/14  . Alcohol use No    Review of Systems Constitutional: No fever/chills Eyes: No visual changes. ENT: No sore throat. No stiff neck no neck pain Cardiovascular: Denies chest pain. Respiratory: Denies shortness of breath. Gastrointestinal:  see history of present illness Genitourinary: Negative for dysuria. Musculoskeletal: Negative lower extremity swelling Skin: Negative for rash. Neurological: Negative for severe headaches, focal weakness or numbness.   ____________________________________________   PHYSICAL EXAM:  VITAL SIGNS: ED Triage Vitals [05/09/17 1506]  Enc Vitals Group     BP (!) 141/80     Pulse Rate 88     Resp 18     Temp 97.6 F (36.4 C)     Temp Source Oral     SpO2 98 %     Weight 137 lb (62.1 kg)     Height 5\' 2"  (1.575 m)     Head Circumference      Peak Flow      Pain Score 10     Pain Loc      Pain Edu?      Excl. in GC?     Constitutional: Alert and oriented. nontoxic Eyes: Conjunctivae are normal Head: Atraumatic HEENT: No  congestion/rhinnorhea. Mucous membranes are moist.  Oropharynx non-erythematous Neck:   Nontender with no meningismus, no masses, no stridor Cardiovascular: Normal rate, regular rhythm. Grossly normal heart sounds.  Good peripheral circulation. Respiratory: Normal respiratory effort.  No retractions. Lungs CTAB. Abdominal: Soft and there is palpation left lower quadrant. No distention. No guarding no rebound Back:  There is no focal tenderness or step off.  there is no midline tenderness there are no lesions noted. there is mild left CVA tenderness Musculoskeletal: No lower extremity tenderness, no upper extremity tenderness. No joint effusions, no DVT signs strong distal pulses no edema Neurologic:  Normal speech and language. No gross focal neurologic deficits are appreciated.  Skin:  Skin is warm, dry and intact. No rash noted. Psychiatric: Mood and affect are normal. Speech and  behavior are normal.  ____________________________________________   LABS (all labs ordered are listed, but only abnormal results are displayed)  Labs Reviewed  COMPREHENSIVE METABOLIC PANEL - Abnormal; Notable for the following:       Result Value   Glucose, Bld 158 (*)    All other components within normal limits  CBC - Abnormal; Notable for the following:    WBC 13.4 (*)    Hemoglobin 16.7 (*)    HCT 48.0 (*)    All other components within normal limits  URINALYSIS, COMPLETE (UACMP) WITH MICROSCOPIC - Abnormal; Notable for the following:    Color, Urine YELLOW (*)    APPearance HAZY (*)    Specific Gravity, Urine 1.041 (*)    Glucose, UA >=500 (*)    Bacteria, UA RARE (*)    Squamous Epithelial / LPF 6-30 (*)    All other components within normal limits  LIPASE, BLOOD  URINE DRUG SCREEN, QUALITATIVE ([facility] ONLY)    Pertinent labs  results that were available during my care of the patient were reviewed by me and considered in my medical decision making (see chart for  details). ____________________________________________  EKG  I personally interpreted any EKGs ordered by me or triage _______________________________________  RADIOLOGY  Pertinent labs & imaging results that were available during my care of the patient were reviewed by me and considered in my medical decision making (see chart for details). If possible, patient and/or family made aware of any abnormal findings. ____________________________________________    PROCEDURES  Procedure(s) performed: None  Procedures  Critical Care performed: None  ____________________________________________   INITIAL IMPRESSION / ASSESSMENT AND PLAN / ED COURSE  Pertinent labs & imaging results that were available during my care of the patient were reviewed by me and considered in my medical decision making (see chart for details).  patient here with left lower quadrant abdominal pain, sudden onset, diverticulitis, ovarian cyst, kidney stone on differential we will initiate a CT, hemoglobin, blood work etc. very reassuring. Patient has only had symptoms for one hour. HEENT give her IV fluid and reassessed.    ____________________________________________   FINAL CLINICAL IMPRESSION(S) / ED DIAGNOSES  Final diagnoses:  None      This chart was dictated using voice recognition software.  Despite best efforts to proofread,  errors can occur which can change meaning.      Schuyler Amor, MD 05/09/17 [phone]

## 2017-05-09 NOTE — ED Triage Notes (Signed)
Lower abdominal pain that radiates  To left side of back. Dysuria since pain started 20 minutes ago. Pt alert and oriented X4, active, cooperative, pt in NAD. RR even and unlabored, color WNL.

## 2017-05-09 NOTE — Discharge Instructions (Signed)
Follow up closely with ob tomorrow. Return to the emergency room if you have increased pain, fever, vomiting, lightheadedness or you feel worse in any way. Do not drive or drink on percocet.

## 2017-05-09 NOTE — ED Notes (Signed)
Historectomy 2014, right ovary removed later d/t becoming un attached and dying, as per patient. Left ovary is still intact.

## 2017-05-11 ENCOUNTER — Encounter: Payer: Self-pay | Admitting: Maternal Newborn

## 2017-05-11 ENCOUNTER — Ambulatory Visit (INDEPENDENT_AMBULATORY_CARE_PROVIDER_SITE_OTHER): Payer: BLUE CROSS/BLUE SHIELD | Admitting: Maternal Newborn

## 2017-05-11 DIAGNOSIS — N83299 Other ovarian cyst, unspecified side: Secondary | ICD-10-CM | POA: Diagnosis not present

## 2017-05-11 NOTE — Progress Notes (Signed)
Obstetrics & Gynecology Office Visit   Chief Complaint: ED follow up for abdominal pain, ovarian cysts seen on imaging.  History of Present Illness: The patient is a 46 y.o. female presenting for emergency room follow up concerning recently imaged left adnexal cysts.  Initial presentation was prompted by left lower quadrant abdominal pain.  Previous transvaginal ultrasound imaging demonstrated dimensions of 6.3 cm x 3.3 cm x 4.4 cm.  Appearance was notable for multiple cysts, complex appearance and no free fluid. The patient endorses associated symptoms of abdominal pain and nausea.  The patient denies associated symptoms of  vaginal bleeding, pelvic pressure, constipation and diarrhea. Her left lower abdominal pain has decreased to mild to absent since her ED visit.  There is a notable family history of ovarian cancer, uterine cancer, breast cancer, or colon cancer.  Review of Systems: 10 point review of systems negative unless otherwise noted in HPI.  Past Medical History:  Past Medical History:  Diagnosis Date  . Cancer ([place])   . Diabetes mellitus   . Hay fever    Allergies  . Heart murmur   . History of HPV infection   . Hypercholesterolemia   . Hypothyroidism   . Myocardial infarction ([place]) 1997   At age 29  . Ovarian cyst 05/09/2017  . Thyroid disease   . Tobacco abuse   . Vaginal cancer ([place])   . Vulvar lesion     Past Surgical History:  Past Surgical History:  Procedure Laterality Date  . ABDOMINAL HYSTERECTOMY  10/12/12  . ANKLE SURGERY  2010  . BREAST BIOPSY Bilateral @ 2013   core with clips  . TUBAL LIGATION  2004  . TUBAL LIGATION  2004, [facility]  . VULVAR LESION REMOVAL  10/2009 & 05/2012  . VULVAR LESION REMOVAL N/A 04/07/2015   Procedure: VULVAR LESION;  Surgeon: Gillis Ends, MD;  Location: [facility] ORS;  Service: Gynecology;  Laterality: N/A;  with J-Plasma blade    Gynecologic History: Patient's last menstrual period was 10/12/2012.  Obstetric  History: No obstetric history on file.  Family History:  Family History  Problem Relation Age of Onset  . Diabetes Father   . Hypertension Mother   . Hyperlipidemia Mother   . Cancer Other        Aunt-breast; grandmother- lung and ovarian  . Aneurysm Brother   . Breast cancer Neg Hx     Social History:  Social History   Social History  . Marital status: Married    Spouse name: N/A  . Number of children: N/A  . Years of education: N/A   Occupational History  . Not on file.   Social History Main Topics  . Smoking status: Current Every Day Smoker    Packs/day: 0.50    Years: 22.00    Types: Cigarettes  . Smokeless tobacco: Never Used     Comment: Down to .5ppd, trying to quit.  Allergic to chantix and e-cigs 02/05/14  . Alcohol use No  . Drug use: No  . Sexual activity: Yes    Birth control/ protection: Surgical   Other Topics Concern  . Not on file   Social History Narrative   No regular exercise.   Lives with spouse.    Allergies:  Allergies  Allergen Reactions  . Chantix [Varenicline] Rash  . Septra [Sulfamethoxazole-Trimethoprim] Rash    Medications: Prior to Admission medications   Medication Sig Start Date End Date Taking? Authorizing Provider  ACCU-CHEK AVIVA PLUS test strip TEST  2 TIMES A DAY 06/07/15   Renato Shin, MD  ACCU-CHEK AVIVA PLUS test strip TEST 2 TIMES A DAY 07/05/15   Renato Shin, MD  B-D ULTRAFINE III SHORT PEN 31G X 8 MM MISC USE TWO NEEDLES DAILY 05/10/15   Renato Shin, MD  Blood Glucose Monitoring Suppl (ACCU-CHEK AVIVA PLUS) W/DEVICE KIT 1 Device by Does not apply route once. 05/26/14   Renato Shin, MD  canagliflozin (INVOKANA) 300 MG TABS tablet Take 300 mg by mouth daily before breakfast.    [provider]  clobetasol ointment (TEMOVATE) 4.09 % Apply 1 application topically every other day. At night 04/11/17   Mellody Drown, MD  cyclobenzaprine (FLEXERIL) 10 MG tablet Take 10 mg by mouth 3 (three) times daily as  needed for muscle spasms.    [provider]  Ibuprofen-Famotidine (DUEXIS) 800-26.6 MG TABS Take 1 tablet by mouth every 8 (eight) hours.    [provider]  insulin aspart (NOVOLOG FLEXPEN) 100 UNIT/ML FlexPen 4 units with breakfast, and 4 units with the evening meal. 12/14/14   Renato Shin, MD  Insulin Glargine (LANTUS SOLOSTAR) 100 UNIT/ML Solostar Pen Inject 27 Units into the skin every morning. 09/15/14   Renato Shin, MD  levothyroxine (SYNTHROID, LEVOTHROID) 112 MCG tablet TAKE 1 TABLET (112 MCG TOTAL) BY MOUTH DAILY. 09/01/14   Crecencio Mc, MD  ondansetron (ZOFRAN) 4 MG tablet Take 1 tablet (4 mg total) by mouth daily as needed for nausea or vomiting. 05/09/17   Schuyler Amor, MD  oxyCODONE-acetaminophen (ROXICET) 5-325 MG tablet Take 1 tablet by mouth every 6 (six) hours as needed. 05/09/17 05/09/18  Schuyler Amor, MD  simvastatin (ZOCOR) 40 MG tablet Take 40 mg by mouth every evening.    [provider]    Physical Exam Vitals:  Vitals:   05/11/17 1516  BP: 100/60  Pulse: 84   Patient's last menstrual period was 10/12/2012.  General: NAD HEENT: normocephalic, anicteric Pulmonary: No increased work of breathing Neurologic: Grossly intact Psychiatric: mood appropriate, affect full  Female chaperone present for pelvic and breast  portions of the physical exam  Assessment: 46 y.o. No obstetric history on file presenting for emergency room follow up with imaging that reveals multiple ovarian cysts: 1) 3.7 x 2.6 x 3.3 cm (consistent with hemorrhagic cyst) and 2) 2.3 x 2.1 x 2.3 cm complex cyst.  Plan: Problem List Items Addressed This Visit    Ovarian cyst, complex   Relevant Orders   US PELVIS (TRANSABDOMINAL ONLY)      1) PREMENOPAUSAL The incidence and implication of adnexal masses and ovarian cysts were discussed with the patient in detail.  Prior imaging was reviewed at today's visit. The vast majority of these lesions will represent  benign or physiologic processes and may well resolve on repeat imaging with expectant management.  We discussed that in a premenopausal patient not on ovulation suppression with via a systemic form of hormonal contraception the normal function of the ovary during follicular development is the formation of a dominant follicle or cyst(s) every month.  This is an essential part of normal reproductive physiology.  In some cases these cysts can take on larger dimensions, hemorrhage, or undergo torsion making them symptomatic. Torsion is relatively unlikely for lesions under 5 cm.  Based on initial imaging findings the overall concern for malignancy is deemed low.  We will obtain follow up imagining approximately 6 weeks from the date of the initial imaging study.  Torsion precautions were given.  2) Tumor makers were not ordered.   Return in about 6 weeks (around 06/22/2017) for Ovarian cyst f/u .  Avel Sensor, CNM 05/11/2017  4:45 PM

## 2017-05-14 ENCOUNTER — Ambulatory Visit
Admission: RE | Admit: 2017-05-14 | Discharge: 2017-05-14 | Disposition: A | Discharge status: Home / Self Care | Payer: BLUE CROSS/BLUE SHIELD | Source: Ambulatory Visit | Attending: Internal Medicine | Admitting: Internal Medicine

## 2017-05-14 DIAGNOSIS — R05 Cough: Secondary | ICD-10-CM | POA: Insufficient documentation

## 2017-05-14 DIAGNOSIS — R918 Other nonspecific abnormal finding of lung field: Secondary | ICD-10-CM | POA: Insufficient documentation

## 2017-05-14 DIAGNOSIS — I700 Atherosclerosis of aorta: Secondary | ICD-10-CM | POA: Insufficient documentation

## 2017-05-14 DIAGNOSIS — R059 Cough, unspecified: Secondary | ICD-10-CM

## 2017-05-14 HISTORY — DX: Malignant melanoma of skin, unspecified: C43.9

## 2017-05-14 MED ORDER — IOPAMIDOL (ISOVUE-300) INJECTION 61%
75.0000 mL | Freq: Once | INTRAVENOUS | Status: AC | PRN
  Administered 2017-05-14: 75 mL via INTRAVENOUS

## 2017-05-16 ENCOUNTER — Encounter: Payer: Self-pay | Admitting: Internal Medicine

## 2017-05-18 NOTE — Addendum Note (Signed)
Addended by: Laverle Hobby on: 05/18/2017 10:54 AM   Modules accepted: Orders, SmartSet

## 2017-05-22 ENCOUNTER — Encounter: Payer: Self-pay | Admitting: Obstetrics & Gynecology

## 2017-05-22 ENCOUNTER — Ambulatory Visit (INDEPENDENT_AMBULATORY_CARE_PROVIDER_SITE_OTHER): Payer: BLUE CROSS/BLUE SHIELD

## 2017-05-22 ENCOUNTER — Ambulatory Visit (INDEPENDENT_AMBULATORY_CARE_PROVIDER_SITE_OTHER): Payer: BLUE CROSS/BLUE SHIELD | Admitting: Obstetrics & Gynecology

## 2017-05-22 ENCOUNTER — Other Ambulatory Visit: Payer: Self-pay | Admitting: Maternal Newborn

## 2017-05-22 VITALS — BP 120/80 | HR 77 | Ht 62.0 in | Wt 145.0 lb

## 2017-05-22 DIAGNOSIS — R1032 Left lower quadrant pain: Secondary | ICD-10-CM

## 2017-05-22 DIAGNOSIS — N83299 Other ovarian cyst, unspecified side: Secondary | ICD-10-CM

## 2017-05-22 NOTE — Progress Notes (Signed)
  HPI: Pt has lower quadrant pain and known LEFT OVARIAN CYST which has persisted in moderated pain without radiation for 3 weeks; concern for torsion or increase in size; no assoc sx's today.  Prior Hyst and RSO.  Ultrasound demonstrates cyst seen 5 cm and complex; no evidence for torsion. These findings are Pelvis abnormal .  PMHx: She  has a past medical history of Diabetes mellitus; Hay fever; Heart murmur; History of HPV infection; Hypercholesterolemia; Hypothyroidism; Melanoma of skin ([place]) (2010); Myocardial infarction ([place]) (1997); Ovarian cyst (05/09/2017); Thyroid disease; Tobacco abuse; and Vulvar lesion. Also,  has a past surgical history that includes Ankle surgery (2010); Tubal ligation (2004); Tubal ligation (2004, [facility]); Abdominal hysterectomy (10/12/12); Vulvar lesion removal (10/2009 & 05/2012); Vulvar lesion removal (N/A, 04/07/2015); and Breast biopsy (Bilateral, @ 2013)., family history includes Aneurysm in her brother; Cancer in her other; Diabetes in her father; Hyperlipidemia in her mother; Hypertension in her mother.,  reports that she has been smoking Cigarettes.  She has a 11.00 pack-year smoking history. She has never used smokeless tobacco. She reports that she does not drink alcohol or use drugs.  She has a current medication list which includes the following prescription(s): accu-chek aviva plus, b-d ultrafine iii short pen, accu-chek aviva plus, canagliflozin, clobetasol ointment, cyclobenzaprine, insulin aspart, insulin glargine, levothyroxine, ondansetron, oxycodone-acetaminophen, and simvastatin, and the following Facility-Administered Medications: lidocaine. Also, is allergic to chantix [varenicline] and septra [sulfamethoxazole-trimethoprim].  Review of Systems  All other systems reviewed and are negative.  Objective: BP 120/80   Pulse 77   Ht 5\' 2"  (1.575 m)   Wt 145 lb (65.8 kg)   LMP 10/12/2012   BMI 26.52 kg/m   Physical examination Constitutional NAD,  Conversant  Skin No rashes, lesions or ulceration.   Extremities: Moves all appropriately.  Normal ROM for age. No lymphadenopathy.  Neuro: Grossly intact  Psych: Oriented to PPT.  Normal mood. Normal affect.   Assessment:  Ovarian cyst, complex and Left lower quadrant pain Options discussed, surgery vs exp mgt.  Pros and cons of each.  If surgery then likely LSO with possibilities of worsening menopause.  Will wait and see for now, [place] one month.  A total of 15 minutes were spent face-to-face with the patient during this encounter and over half of that time dealt with counseling and coordination of care.  Barnett Applebaum, MD, [facility], [facility] 05/22/2017  4:12 PM

## 2017-05-23 ENCOUNTER — Telehealth: Payer: Self-pay | Admitting: *Deleted

## 2017-05-23 NOTE — Telephone Encounter (Signed)
  Patient aware of bronchoscopy to arrive at 12 noon. NPO after midnight. Must have a driver stay during the procedure.   Marcia [name] DOB: 05/22/2071 MR [id number]  Dr. Ashby Dawes Regular Bronchoscopy  Wed 05/30/17 [address]

## 2017-05-24 ENCOUNTER — Telehealth: Payer: Self-pay

## 2017-05-24 NOTE — Telephone Encounter (Signed)
Pt calling to let [place] know she does want the surgery to remove ovary.  Would like it scheduled on a Friday if possible so she won't miss work.  [phone]

## 2017-05-25 NOTE — Telephone Encounter (Signed)
Patient is aware of H&P on 06/01/17 @ 2:30pm, Pre-admit Testing phone interview to be scheduled, and OR on 06/14/17. Ext given.

## 2017-05-25 NOTE — Telephone Encounter (Signed)
Please offer Nov 8 for surgery (no immediate Friday availability but this is a Thurs); or any other day Im in [place] Request Patient Full Name:  SRUTI AYLLON MRN: [id number]  DOB: Feb 19, 1971  Surgeon: Hoyt Koch, MD  Requested Surgery Date and Time: NOV 8 Primary Diagnosis AND Code: Left ovarian Cyst, Left Lower Quadrant Pain Secondary Diagnosis and Code:  Surgical Procedure: Laparoscopy, LSO L&D Notification: No Admission Status: same day surgery Length of Surgery: 45 min Special Case Needs: no H&P: yes (date) Phone Interview???: yes Interpreter: Language:  Medical Clearance: no Special Scheduling Instructions: no

## 2017-05-30 ENCOUNTER — Encounter
Admission: RE | Disposition: A | Discharge status: Home / Self Care | Payer: Self-pay | Source: Ambulatory Visit | Attending: Internal Medicine

## 2017-05-30 ENCOUNTER — Ambulatory Visit
Admission: RE | Admit: 2017-05-30 | Discharge: 2017-05-30 | Disposition: A | Discharge status: Home / Self Care | Payer: BLUE CROSS/BLUE SHIELD | Source: Ambulatory Visit | Attending: Internal Medicine | Admitting: Internal Medicine

## 2017-05-30 ENCOUNTER — Encounter: Payer: Self-pay | Admitting: Emergency Medicine

## 2017-05-30 DIAGNOSIS — Z882 Allergy status to sulfonamides status: Secondary | ICD-10-CM | POA: Insufficient documentation

## 2017-05-30 DIAGNOSIS — R05 Cough: Secondary | ICD-10-CM | POA: Diagnosis not present

## 2017-05-30 DIAGNOSIS — E7800 Pure hypercholesterolemia, unspecified: Secondary | ICD-10-CM | POA: Insufficient documentation

## 2017-05-30 DIAGNOSIS — F17210 Nicotine dependence, cigarettes, uncomplicated: Secondary | ICD-10-CM | POA: Insufficient documentation

## 2017-05-30 DIAGNOSIS — Z79899 Other long term (current) drug therapy: Secondary | ICD-10-CM | POA: Insufficient documentation

## 2017-05-30 DIAGNOSIS — Z7982 Long term (current) use of aspirin: Secondary | ICD-10-CM | POA: Insufficient documentation

## 2017-05-30 DIAGNOSIS — Z720 Tobacco use: Secondary | ICD-10-CM

## 2017-05-30 DIAGNOSIS — Z79891 Long term (current) use of opiate analgesic: Secondary | ICD-10-CM | POA: Insufficient documentation

## 2017-05-30 DIAGNOSIS — E119 Type 2 diabetes mellitus without complications: Secondary | ICD-10-CM | POA: Diagnosis not present

## 2017-05-30 DIAGNOSIS — Z888 Allergy status to other drugs, medicaments and biological substances status: Secondary | ICD-10-CM | POA: Diagnosis not present

## 2017-05-30 DIAGNOSIS — E039 Hypothyroidism, unspecified: Secondary | ICD-10-CM | POA: Diagnosis not present

## 2017-05-30 DIAGNOSIS — R059 Cough, unspecified: Secondary | ICD-10-CM

## 2017-05-30 DIAGNOSIS — I252 Old myocardial infarction: Secondary | ICD-10-CM | POA: Diagnosis not present

## 2017-05-30 DIAGNOSIS — J42 Unspecified chronic bronchitis: Secondary | ICD-10-CM | POA: Insufficient documentation

## 2017-05-30 DIAGNOSIS — Z794 Long term (current) use of insulin: Secondary | ICD-10-CM | POA: Insufficient documentation

## 2017-05-30 HISTORY — PX: FLEXIBLE BRONCHOSCOPY: SHX5094

## 2017-05-30 SURGERY — BRONCHOSCOPY, FLEXIBLE
Anesthesia: Moderate Sedation

## 2017-05-30 MED ORDER — MIDAZOLAM HCL 5 MG/5ML IJ SOLN
INTRAMUSCULAR | Status: AC
  Filled 2017-05-30: qty 10

## 2017-05-30 MED ORDER — BUTAMBEN-TETRACAINE-BENZOCAINE 2-2-14 % EX AERO
1.0000 | INHALATION_SPRAY | Freq: Once | CUTANEOUS | Status: DC
  Filled 2017-05-30: qty 20

## 2017-05-30 MED ORDER — FENTANYL CITRATE (PF) 100 MCG/2ML IJ SOLN
INTRAMUSCULAR | Status: AC
  Filled 2017-05-30: qty 4

## 2017-05-30 MED ORDER — PHENYLEPHRINE HCL 0.25 % NA SOLN
1.0000 | Freq: Four times a day (QID) | NASAL | Status: DC | PRN
  Filled 2017-05-30: qty 15

## 2017-05-30 MED ORDER — LIDOCAINE HCL 2 % EX GEL: 1.0000 "application " | Freq: Once | CUTANEOUS | Status: DC

## 2017-05-30 MED ORDER — FENTANYL CITRATE (PF) 100 MCG/2ML IJ SOLN
INTRAMUSCULAR | Status: AC | PRN
  Administered 2017-05-30 (×2): 25 ug via INTRAVENOUS

## 2017-05-30 MED ORDER — MIDAZOLAM HCL 5 MG/5ML IJ SOLN
INTRAMUSCULAR | Status: AC | PRN
  Administered 2017-05-30 (×2): 2 mg via INTRAVENOUS

## 2017-05-30 NOTE — Op Note (Signed)
  [facility]            Bronchoscopy Note   FINDINGS/SUMMARY:   -Findings of chronic bronchitis with moderate mucosal edema erythema and moderate secretions throughout both airways. -No endobronchial lesions found. -BAL and transbronchial brushings performed of the right middle lobe. -BAL and transbronchial brushings performed of the lingula.  Indication: Chronic cough The patient (or their representative) was informed of the risks (including but not limited to bleeding, infection, respiratory failure, lung injury, tooth/oral injury) and benefits of the procedure and gave consent, see chart.   Pre-op diagnosis: Chronic cough Post-op diagnosis: Chronic cough, chronic bronchitis. Estimated blood loss: None  Medications for procedure: Versed 4 mg, fentanyl 50 mcg, total conscious sedation time of 30 minutes.  Procedure description: After obtaining informed consent, a timeout was called to confirm the patient and the procedure.  The right and left nares were checked for patency, the right naris appeared to be more patent, this was anesthetized with topical anesthetic. Bronchoscope was passed via the right nares to the posterior pharynx, normal movements of the vocal cords was visualized. The bronchoscope was taken to the trachea, lidocaine was applied to both right and mainstem bronchi.  With cough there was mildly decreased movements of the left side noted.  An anatomic contour was undertaken, there was moderate erythema, moderate mucosal secretions, enlarged mucous glands throughout both airways.  No endobronchial lesions were noted.  Bronchoscope was then wedged in the right middle lobe, transbronchial cytology brushes were taken of the right and left segments, subsequently a BAL was performed with good returns. The bronchoscope was then wedged in the lingula, transbronchial cytology brushings were taken x2.  Subsequently BAL was performed with good returns, 2 traps were  obtained one was sent for micro, another sent for cytology. As adequate specimens had been obtained, the bronchoscope was removed, the patient was taken to recovery.   Condition post procedure: stable   Complications: None noted   Deep Ashby Dawes, MD.  Board Certified in Internal Medicine, Pulmonary Medicine, [place], and Sleep Medicine.  [facility] Office Number: [phone]  Patricia Pesa, M.D.  Cheral Marker, M.D  05/30/2017

## 2017-05-30 NOTE — Progress Notes (Signed)
Pt sitting up in bed drinking w/o difficulty in NAD at this time.  Dr. Ashby Dawes at bedside earlier to discuss procedure and follow up with pt and family.  Discharge instructions reviewed with pt and family who verbalize understanding.

## 2017-05-30 NOTE — Sedation Documentation (Signed)
Pt placed on oxygen blow by

## 2017-05-30 NOTE — H&P (Signed)
Marcia [name] is an 46 y.o. female.   Pt presents for bronchoscopy for cough. No  New complaints or findings since previous examination on 04/12/17.   Past Medical History:  Diagnosis Date  . Diabetes mellitus   . Hay fever    Allergies  . Heart murmur   . History of HPV infection   . Hypercholesterolemia   . Hypothyroidism   . Melanoma of skin ([place]) 2010   In her vaginal area with surgical resection.   . Myocardial infarction ([place]) 1997   At age 82  . Ovarian cyst 05/09/2017  . Thyroid disease   . Tobacco abuse   . Vulvar lesion     Past Surgical History:  Procedure Laterality Date  . ABDOMINAL HYSTERECTOMY  10/12/12  . ANKLE SURGERY  2010  . BREAST BIOPSY Bilateral @ 2013   core with clips  . TUBAL LIGATION  2004  . TUBAL LIGATION  2004, [facility]  . VULVAR LESION REMOVAL  10/2009 & 05/2012  . VULVAR LESION REMOVAL N/A 04/07/2015   Procedure: VULVAR LESION;  Surgeon: Gillis Ends, MD;  Location: [facility] ORS;  Service: Gynecology;  Laterality: N/A;  with J-Plasma blade    Family History  Problem Relation Age of Onset  . Diabetes Father   . Hypertension Mother   . Hyperlipidemia Mother   . Cancer Other        Aunt-breast; grandmother- lung and ovarian  . Aneurysm Brother   . Breast cancer Neg Hx    Social History:  reports that she has been smoking Cigarettes.  She has a 11.00 pack-year smoking history. She has never used smokeless tobacco. She reports that she does not drink alcohol or use drugs.  Allergies:  Allergies  Allergen Reactions  . Chantix [Varenicline] Rash  . Septra [Sulfamethoxazole-Trimethoprim] Rash    Medications Prior to Admission  Medication Sig Dispense Refill  . aspirin EC 81 MG tablet Take 81 mg by mouth daily.    Marland Kitchen atorvastatin (LIPITOR) 20 MG tablet Take 20 mg by mouth at bedtime.    . canagliflozin (INVOKANA) 300 MG TABS tablet Take 300 mg by mouth daily before breakfast.    . clobetasol ointment (TEMOVATE) 7.90 % Apply 1 application  topically every other day. At night 30 g 3  . cyclobenzaprine (FLEXERIL) 10 MG tablet Take 10 mg by mouth 3 (three) times daily as needed for muscle spasms.    . ergocalciferol (VITAMIN D2) 50000 units capsule Take 50,000 Units by mouth every Wednesday.    . insulin aspart (NOVOLOG FLEXPEN) 100 UNIT/ML FlexPen 4 units with breakfast, and 4 units with the evening meal. (Patient taking differently: Inject into the skin 3 (three) times daily with meals. Sliding scale  131-180= 2 units 181-240= 4 units 241-300= 6 untis  301-350= 8 units  >351= 10units) 15 mL 11  . Insulin Glargine (LANTUS SOLOSTAR) 100 UNIT/ML Solostar Pen Inject 27 Units into the skin every morning. (Patient taking differently: Inject 30 Units into the skin at bedtime. ) 5 pen PRN  . levothyroxine (SYNTHROID, LEVOTHROID) 75 MCG tablet Take 75 mcg by mouth daily before breakfast.    . ACCU-CHEK AVIVA PLUS test strip TEST 2 TIMES A DAY 100 each 6  . B-D ULTRAFINE III SHORT PEN 31G X 8 MM MISC USE TWO NEEDLES DAILY 100 each 8  . Blood Glucose Monitoring Suppl (ACCU-CHEK AVIVA PLUS) W/DEVICE KIT 1 Device by Does not apply route once. 1 kit 0  . levothyroxine (SYNTHROID, LEVOTHROID)  112 MCG tablet TAKE 1 TABLET (112 MCG TOTAL) BY MOUTH DAILY. (Patient not taking: Reported on 05/23/2017) 30 tablet 1  . ondansetron (ZOFRAN) 4 MG tablet Take 1 tablet (4 mg total) by mouth daily as needed for nausea or vomiting. (Patient not taking: Reported on 05/23/2017) 10 tablet 0  . oxyCODONE-acetaminophen (ROXICET) 5-325 MG tablet Take 1 tablet by mouth every 6 (six) hours as needed. (Patient not taking: Reported on 05/23/2017) 12 tablet 0    No results found for this or any previous visit (from the past 48 hour(s)). No results found.  Review of Systems  All other systems reviewed and are negative.   Blood pressure (!) 116/54, pulse 86, temperature 98.3 F (36.8 C), temperature source Oral, resp. rate 18, height _0  (1.575 m), weight 142 lb  (64.4 kg), last menstrual period 10/12/2012, SpO2 98 %. Physical Exam  Constitutional: She appears well-developed.  HENT:  Right Ear: External ear normal.  Eyes: Pupils are equal, round, and reactive to light.  Cardiovascular: Normal rate.   Respiratory: Effort normal.  GI: Soft.  Neurological: She is alert.     Assessment/Plan Will proceed with bronchoscopy.   Laverle Hobby, MD 05/30/2017, 12:01 PM

## 2017-05-30 NOTE — Discharge Instructions (Signed)
Flexible Bronchoscopy, Care After This sheet gives you information about how to care for yourself after your procedure. Your health care provider may also give you more specific instructions. If you have problems or questions, contact your health care provider. What can I expect after the procedure? After the procedure, it is common to have the following symptoms for 24-48 hours:  A cough that is worse than it was before the procedure.  A low-grade fever.  A sore throat or hoarse voice.  Small streaks of blood in the mucus from your lungs (sputum), if tissue samples were removed (biopsy).  Follow these instructions at home: Eating and drinking  Do not eat or drink anything (including water) for 2 hours after your procedure, or until your numbing medicine (local anesthetic) has worn off. Having a numb throat increases your risk of burning yourself or choking.  After your numbness is gone and your cough and gag reflexes have returned, you may start eating only soft foods and slowly drinking liquids.  The day after the procedure, return to your normal diet. Driving  Do not drive for 24 hours if you were given a medicine to help you relax (sedative).  Do not drive or use heavy machinery while taking prescription pain medicine. General instructions  Take over-the-counter and prescription medicines only as told by your health care provider.  Return to your normal activities as told by your health care provider. Ask your health care provider what activities are safe for you.  Do not use any products that contain nicotine or tobacco, such as cigarettes and e-cigarettes. If you need help quitting, ask your health care provider.  Keep all follow-up visits as told by your health care provider. This is important, especially if you had a biopsy taken. Get help right away if:  You have shortness of breath that gets worse.  You become light-headed or feel like you might faint.  You have  chest pain.  You cough up more than a small amount of blood.  The amount of blood you cough up increases. Summary  Common symptoms in the 24-48 hours following a flexible bronchoscopy include cough, low-grade fever, sore throat or hoarse voice, and blood-streaked mucus from the lungs (if you had a biopsy).  Do not eat or drink anything (including water) for 2 hours after your procedure, or until your local anesthetic has worn off. You can return to your normal diet the day after the procedure.  Get help right away if you develop worsening shortness of breath, have chest pain, become light-headed, or cough up more than a small amount of blood. This information is not intended to replace advice given to you by your health care provider. Make sure you discuss any questions you have with your health care provider. Document Released: 02/10/2005 Document Revised: 08/11/2016 Document Reviewed: 08/11/2016 Elsevier Interactive Patient Education  2017 [employer]. Moderate Conscious Sedation, Adult, Care After These instructions provide you with information about caring for yourself after your procedure. Your health care provider may also give you more specific instructions. Your treatment has been planned according to current medical practices, but problems sometimes occur. Call your health care provider if you have any problems or questions after your procedure. What can I expect after the procedure? After your procedure, it is common:  To feel sleepy for several hours.  To feel clumsy and have poor balance for several hours.  To have poor judgment for several hours.  To vomit if you eat too soon.  Follow these instructions at home: For at least 24 hours after the procedure:   Do not: ? Participate in activities where you could fall or become injured. ? Drive. ? Use heavy machinery. ? Drink alcohol. ? Take sleeping pills or medicines that cause drowsiness. ? Make important decisions  or sign legal documents. ? Take care of children on your own.  Rest. Eating and drinking  Follow the diet recommended by your health care provider.  If you vomit: ? Drink water, juice, or soup when you can drink without vomiting. ? Make sure you have little or no nausea before eating solid foods. General instructions  Have a responsible adult stay with you until you are awake and alert.  Take over-the-counter and prescription medicines only as told by your health care provider.  If you smoke, do not smoke without supervision.  Keep all follow-up visits as told by your health care provider. This is important. Contact a health care provider if:  You keep feeling nauseous or you keep vomiting.  You feel light-headed.  You develop a rash.  You have a fever. Get help right away if:  You have trouble breathing. This information is not intended to replace advice given to you by your health care provider. Make sure you discuss any questions you have with your health care provider. Document Released: 05/14/2013 Document Revised: 12/27/2015 Document Reviewed: 11/13/2015 Elsevier Interactive Patient Education  [employer].

## 2017-05-30 NOTE — Sedation Documentation (Addendum)
Lidocaine and cetacaine applied at this time

## 2017-05-31 ENCOUNTER — Encounter: Payer: Self-pay | Admitting: Internal Medicine

## 2017-06-01 ENCOUNTER — Encounter: Payer: Self-pay | Admitting: Obstetrics & Gynecology

## 2017-06-01 ENCOUNTER — Ambulatory Visit (INDEPENDENT_AMBULATORY_CARE_PROVIDER_SITE_OTHER): Payer: BLUE CROSS/BLUE SHIELD | Admitting: Obstetrics & Gynecology

## 2017-06-01 VITALS — BP 100/70 | Ht 62.0 in | Wt 147.0 lb

## 2017-06-01 DIAGNOSIS — R1032 Left lower quadrant pain: Secondary | ICD-10-CM

## 2017-06-01 DIAGNOSIS — N83299 Other ovarian cyst, unspecified side: Secondary | ICD-10-CM | POA: Diagnosis not present

## 2017-06-01 LAB — CYTOLOGY - NON PAP

## 2017-06-01 LAB — ACID FAST SMEAR (AFB)

## 2017-06-01 LAB — ACID FAST SMEAR (AFB, MYCOBACTERIA): Acid Fast Smear: NEGATIVE

## 2017-06-01 NOTE — Progress Notes (Signed)
PRE-OPERATIVE HISTORY AND PHYSICAL EXAM  HPI:  Marcia [name] is a 46 y.o. [id number] Patient's last menstrual period was 10/12/2012.; she is being admitted for surgery related to adnexal mass.  Pt has lower quadrant pain and known LEFT OVARIAN CYST which has persisted in moderated pain without radiation for 3 weeks; concern for torsion or increase in size; no assoc sx's today. Prefers LSO to cystectomy, understands risks of menopausal sx's. Prior Hyst and RSO.  Ultrasound demonstrated cyst seen 5 cm and complex; no evidence for torsion.  PMHx: Past Medical History:  Diagnosis Date  . Diabetes mellitus   . Hay fever    Allergies  . Heart murmur   . History of HPV infection   . Hypercholesterolemia   . Hypothyroidism   . Melanoma of skin ([place]) 2010   In her vaginal area with surgical resection.   . Myocardial infarction ([place]) 1997   At age 14  . Ovarian cyst 05/09/2017  . Thyroid disease   . Tobacco abuse   . Vulvar lesion    Past Surgical History:  Procedure Laterality Date  . ABDOMINAL HYSTERECTOMY  10/12/12  . ANKLE SURGERY  2010  . BREAST BIOPSY Bilateral @ 2013   core with clips  . FLEXIBLE BRONCHOSCOPY N/A 05/30/2017   Procedure: FLEXIBLE BRONCHOSCOPY;  Surgeon: Laverle Hobby, MD;  Location: [facility] ORS;  Service: Pulmonary;  Laterality: N/A;  . TUBAL LIGATION  2004  . TUBAL LIGATION  2004, [facility]  . VULVAR LESION REMOVAL  10/2009 & 05/2012  . VULVAR LESION REMOVAL N/A 04/07/2015   Procedure: VULVAR LESION;  Surgeon: Gillis Ends, MD;  Location: [facility] ORS;  Service: Gynecology;  Laterality: N/A;  with J-Plasma blade   Family History  Problem Relation Age of Onset  . Diabetes Father   . Hypertension Mother   . Hyperlipidemia Mother   . Cancer Other        Aunt-breast; grandmother- lung and ovarian  . Aneurysm Brother   . Breast cancer Neg Hx    Social History  Substance Use Topics  . Smoking status: Current Every Day Smoker    Packs/day: 0.50   Years: 22.00    Types: Cigarettes  . Smokeless tobacco: Never Used     Comment: Down to .5ppd, trying to quit.  Allergic to chantix and e-cigs 02/05/14  . Alcohol use No    Current Outpatient Prescriptions:  .  ACCU-CHEK AVIVA PLUS test strip, TEST 2 TIMES A DAY, Disp: 100 each, Rfl: 6 .  aspirin EC 81 MG tablet, Take 81 mg by mouth daily., Disp: , Rfl:  .  atorvastatin (LIPITOR) 20 MG tablet, Take 20 mg by mouth at bedtime., Disp: , Rfl:  .  B-D ULTRAFINE III SHORT PEN 31G X 8 MM MISC, USE TWO NEEDLES DAILY, Disp: 100 each, Rfl: 8 .  Blood Glucose Monitoring Suppl (ACCU-CHEK AVIVA PLUS) W/DEVICE KIT, 1 Device by Does not apply route once., Disp: 1 kit, Rfl: 0 .  canagliflozin (INVOKANA) 300 MG TABS tablet, Take 300 mg by mouth daily before breakfast., Disp: , Rfl:  .  clobetasol ointment (TEMOVATE) 7.16 %, Apply 1 application topically every other day. At night, Disp: 30 g, Rfl: 3 .  cyclobenzaprine (FLEXERIL) 10 MG tablet, Take 10 mg by mouth 3 (three) times daily as needed for muscle spasms., Disp: , Rfl:  .  ergocalciferol (VITAMIN D2) 50000 units capsule, Take 50,000 Units by mouth every Wednesday., Disp: , Rfl:  .  insulin aspart (NOVOLOG FLEXPEN) 100 UNIT/ML FlexPen, 4 units with breakfast, and 4 units with the evening meal. (Patient taking differently: Inject into the skin 3 (three) times daily with meals. Sliding scale  131-180= 2 units 181-240= 4 units 241-300= 6 untis  301-350= 8 units  >351= 10units), Disp: 15 mL, Rfl: 11 .  Insulin Glargine (LANTUS SOLOSTAR) 100 UNIT/ML Solostar Pen, Inject 27 Units into the skin every morning. (Patient taking differently: Inject 30 Units into the skin at bedtime. ), Disp: 5 pen, Rfl: PRN .  levothyroxine (SYNTHROID, LEVOTHROID) 75 MCG tablet, Take 75 mcg by mouth daily before breakfast., Disp: , Rfl:  .  levothyroxine (SYNTHROID, LEVOTHROID) 112 MCG tablet, TAKE 1 TABLET (112 MCG TOTAL) BY MOUTH DAILY. (Patient not taking: Reported on 05/23/2017),  Disp: 30 tablet, Rfl: 1 .  ondansetron (ZOFRAN) 4 MG tablet, Take 1 tablet (4 mg total) by mouth daily as needed for nausea or vomiting. (Patient not taking: Reported on 05/23/2017), Disp: 10 tablet, Rfl: 0 .  oxyCODONE-acetaminophen (ROXICET) 5-325 MG tablet, Take 1 tablet by mouth every 6 (six) hours as needed. (Patient not taking: Reported on 05/23/2017), Disp: 12 tablet, Rfl: 0 No current facility-administered medications for this visit.   Facility-Administered Medications Ordered in Other Visits:  .  lidocaine (XYLOCAINE) 2 % jelly 1 application, 1 application, Topical, QID PRN, Gillis Ends, MD Allergies: Chantix [varenicline] and Septra [sulfamethoxazole-trimethoprim]  Review of Systems  Constitutional: Negative for chills, fever and malaise/fatigue.  HENT: Negative for congestion, sinus pain and sore throat.   Eyes: Negative for blurred vision and pain.  Respiratory: Negative for cough and wheezing.   Cardiovascular: Negative for chest pain and leg swelling.  Gastrointestinal: Negative for abdominal pain, constipation, diarrhea, heartburn, nausea and vomiting.  Genitourinary: Negative for dysuria, frequency, hematuria and urgency.  Musculoskeletal: Negative for back pain, joint pain, myalgias and neck pain.  Skin: Negative for itching and rash.  Neurological: Negative for dizziness, tremors and weakness.  Endo/Heme/Allergies: Does not bruise/bleed easily.  Psychiatric/Behavioral: Negative for depression. The patient is not nervous/anxious and does not have insomnia.    Objective: BP 100/70   Ht _0  (1.575 m)   Wt 147 lb (66.7 kg)   LMP 10/12/2012   BMI 26.89 kg/m   Filed Weights   06/01/17 1414  Weight: 147 lb (66.7 kg)   Physical Exam  Constitutional: She is oriented to person, place, and time. She appears well-developed and well-nourished. No distress.  Genitourinary: Rectum normal and vagina normal. Pelvic exam was performed with patient supine. There is no  rash or lesion on the right labia. There is no rash or lesion on the left labia. Vagina exhibits no lesion. No bleeding in the vagina. Right adnexum does not display mass and does not display tenderness. Left adnexum does not display mass and does not display tenderness.  Genitourinary Comments: Absent Uterus Absent cervix Vaginal cuff well healed  HENT:  Head: Normocephalic and atraumatic. Head is without laceration.  Right Ear: Hearing normal.  Left Ear: Hearing normal.  Nose: No epistaxis.  No foreign bodies.  Mouth/Throat: Uvula is midline, oropharynx is clear and moist and mucous membranes are normal.  Eyes: Pupils are equal, round, and reactive to light.  Neck: Normal range of motion. Neck supple. No thyromegaly present.  Cardiovascular: Normal rate and regular rhythm.  Exam reveals no gallop and no friction rub.   No murmur heard. Pulmonary/Chest: Effort normal and breath sounds normal. No respiratory distress. She has no wheezes. Right  breast exhibits no mass, no skin change and no tenderness. Left breast exhibits no mass, no skin change and no tenderness.  Abdominal: Soft. Bowel sounds are normal. She exhibits no distension. There is no tenderness. There is no rebound.  Musculoskeletal: Normal range of motion.  Neurological: She is alert and oriented to person, place, and time. No cranial nerve deficit.  Skin: Skin is warm and dry.  Psychiatric: She has a normal mood and affect. Judgment normal.  Vitals reviewed.  Assessment: 1. Left lower quadrant pain   2. Ovarian cyst, complex   Options discussed, pt prefers removal of ovary with cyst. Risks of surgery and of hormone deficiency discussed.  I have had a careful discussion with this patient about all the options available and the risk/benefits of each. I have fully informed this patient that surgery may subject her to a variety of discomforts and risks: She understands that most patients have surgery with little difficulty, but  problems can happen ranging from minor to fatal. These include nausea, vomiting, pain, bleeding, infection, poor healing, hernia, or formation of adhesions. Unexpected reactions may occur from any drug or anesthetic given. Unintended injury may occur to other pelvic or abdominal structures such as Fallopian tubes, ovaries, bladder, ureter (tube from kidney to bladder), or bowel. Nerves going from the pelvis to the legs may be injured. Any such injury may require immediate or later additional surgery to correct the problem. Excessive blood loss requiring transfusion is very unlikely but possible. Dangerous blood clots may form in the legs or lungs. Physical and sexual activity will be restricted in varying degrees for an indeterminate period of time but most often 2-6 weeks.  Finally, she understands that it is impossible to list every possible undesirable effect and that the condition for which surgery is done is not always cured or significantly improved, and in rare cases may be even worse.Ample time was given to answer all questions.  Barnett Applebaum, MD, [facility], [facility] 06/01/2017  2:54 PM

## 2017-06-02 LAB — CULTURE, RESPIRATORY: CULTURE: NORMAL

## 2017-06-02 LAB — CULTURE, RESPIRATORY W GRAM STAIN

## 2017-06-05 ENCOUNTER — Other Ambulatory Visit: Payer: BLUE CROSS/BLUE SHIELD

## 2017-06-08 ENCOUNTER — Inpatient Hospital Stay: Admission: RE | Admit: 2017-06-08 | Payer: BLUE CROSS/BLUE SHIELD | Source: Ambulatory Visit

## 2017-06-12 ENCOUNTER — Inpatient Hospital Stay: Admission: RE | Admit: 2017-06-12 | Payer: BLUE CROSS/BLUE SHIELD | Source: Ambulatory Visit

## 2017-06-12 ENCOUNTER — Other Ambulatory Visit: Payer: Self-pay

## 2017-06-12 ENCOUNTER — Encounter
Admission: RE | Admit: 2017-06-12 | Discharge: 2017-06-12 | Disposition: A | Discharge status: Home / Self Care | Payer: BLUE CROSS/BLUE SHIELD | Source: Ambulatory Visit | Attending: Obstetrics & Gynecology | Admitting: Obstetrics & Gynecology

## 2017-06-12 DIAGNOSIS — Z8249 Family history of ischemic heart disease and other diseases of the circulatory system: Secondary | ICD-10-CM | POA: Diagnosis not present

## 2017-06-12 DIAGNOSIS — Z833 Family history of diabetes mellitus: Secondary | ICD-10-CM | POA: Diagnosis not present

## 2017-06-12 DIAGNOSIS — E039 Hypothyroidism, unspecified: Secondary | ICD-10-CM | POA: Diagnosis not present

## 2017-06-12 DIAGNOSIS — Z79890 Hormone replacement therapy (postmenopausal): Secondary | ICD-10-CM | POA: Diagnosis not present

## 2017-06-12 DIAGNOSIS — N83202 Unspecified ovarian cyst, left side: Secondary | ICD-10-CM | POA: Diagnosis present

## 2017-06-12 DIAGNOSIS — Z90710 Acquired absence of both cervix and uterus: Secondary | ICD-10-CM | POA: Diagnosis not present

## 2017-06-12 DIAGNOSIS — Z803 Family history of malignant neoplasm of breast: Secondary | ICD-10-CM | POA: Diagnosis not present

## 2017-06-12 DIAGNOSIS — Z794 Long term (current) use of insulin: Secondary | ICD-10-CM | POA: Diagnosis not present

## 2017-06-12 DIAGNOSIS — E7800 Pure hypercholesterolemia, unspecified: Secondary | ICD-10-CM | POA: Diagnosis not present

## 2017-06-12 DIAGNOSIS — Z85820 Personal history of malignant melanoma of skin: Secondary | ICD-10-CM | POA: Diagnosis not present

## 2017-06-12 DIAGNOSIS — E119 Type 2 diabetes mellitus without complications: Secondary | ICD-10-CM | POA: Diagnosis not present

## 2017-06-12 DIAGNOSIS — Z888 Allergy status to other drugs, medicaments and biological substances status: Secondary | ICD-10-CM | POA: Diagnosis not present

## 2017-06-12 DIAGNOSIS — F17210 Nicotine dependence, cigarettes, uncomplicated: Secondary | ICD-10-CM | POA: Diagnosis not present

## 2017-06-12 DIAGNOSIS — R1032 Left lower quadrant pain: Secondary | ICD-10-CM | POA: Diagnosis present

## 2017-06-12 DIAGNOSIS — I252 Old myocardial infarction: Secondary | ICD-10-CM | POA: Diagnosis not present

## 2017-06-12 DIAGNOSIS — Z8349 Family history of other endocrine, nutritional and metabolic diseases: Secondary | ICD-10-CM | POA: Diagnosis not present

## 2017-06-12 DIAGNOSIS — N8312 Corpus luteum cyst of left ovary: Secondary | ICD-10-CM | POA: Diagnosis not present

## 2017-06-12 DIAGNOSIS — Z9889 Other specified postprocedural states: Secondary | ICD-10-CM | POA: Diagnosis not present

## 2017-06-12 DIAGNOSIS — Z7982 Long term (current) use of aspirin: Secondary | ICD-10-CM | POA: Diagnosis not present

## 2017-06-12 LAB — CBC
HCT: 48.1 % — ABNORMAL HIGH (ref 35.0–47.0)
HEMOGLOBIN: 16.3 g/dL — AB (ref 12.0–16.0)
MCH: 31.7 pg (ref 26.0–34.0)
MCHC: 33.9 g/dL (ref 32.0–36.0)
MCV: 93.4 fL (ref 80.0–100.0)
PLATELETS: 247 10*3/uL (ref 150–440)
RBC: 5.15 MIL/uL (ref 3.80–5.20)
RDW: 13.3 % (ref 11.5–14.5)
WBC: 14.5 10*3/uL — ABNORMAL HIGH (ref 3.6–11.0)

## 2017-06-12 LAB — TYPE AND SCREEN
ABO/RH(D): O POS
ANTIBODY SCREEN: NEGATIVE

## 2017-06-12 NOTE — Patient Instructions (Signed)
Your procedure is scheduled [id number] 8, 2018(THURSDAY ) Report to Same Day Surgery. ([facility]) SECOND FLOOR To find out your arrival time please call [phone] between 1PM - 3PM on NOVEMBER  7, 2018[facility]  ).  Remember: Instructions that are not followed completely may result in serious medical risk, up to and including death, or upon the discretion of your surgeon and anesthesiologist your surgery may need to be rescheduled.     _X__ 1. Do not eat food after midnight the night before your procedure.                 No gum chewing or hard candies. You may drink clear liquids up to 2 hours                 before you are scheduled to arrive for your surgery- DO not drink clear                 liquids within 2 hours of the start of your surgery.                 Clear Liquids include:  water, apple juice without pulp, clear carbohydrate                 drink such as Clearfast of Gartorade, Black Coffee or Tea (Do not add                 anything to coffee or tea).     _X__ 2.  No Alcohol for 24 hours before or after surgery.   _X__ 3.  Do Not Smoke or use e-cigarettes For 24 Hours Prior to Your Surgery.                 Do not use any chewable tobacco products for at least 6 hours prior to                 surgery.  ____  4.  Bring all medications with you on the day of surgery if instructed.   __X__  5.  Notify your doctor if there is any change in your medical condition      (cold, fever, infections).     Do not wear jewelry, make-up, hairpins, clips or nail polish. Do not wear lotions, powders, or perfumes.  Do not shave 48 hours prior to surgery. Men may shave face and neck. Do not bring valuables to the hospital.    [facility] is not responsible for any belongings or valuables.  Contacts, dentures or bridgework may not be worn into surgery. Leave your suitcase in the car. After surgery it may be brought to your room. For patients admitted to  the hospital, discharge time is determined by your treatment team.   Patients discharged the day of surgery will not be allowed to drive home.   Please read over the following fact sheets that you were given:             _X___ Take these medicines the morning of surgery with A SIP OF WATER:    1. LEVOTHYROXINE  2.   3.   4.  5.  6.  ____ Fleet Enema (as directed)   _X___ Use CHG Soap as directed  ____ Use inhalers on the day of surgery  ____ Stop metformin 2 days prior to surgery    __X__ Take 1/2 of usual insulin dose the night before surgery. No insulin the morning of surgery (TAKE ONE  HALF OF LANTUS INSULIN AT BEDTIME ON WEDNESDAY  NIGHT, AND NO INSULIN THE MORNING OF SURGERY )          _X___ Stop Coumadin/Plavix/aspirin on (ASK DR [facility] ABOUT STOPPING ASPIRIN )  __X__ Stop Anti-inflammatories on (NO ASPIRIN PRODUCTS ) TYLENOL OK TO TAKE FOR PAIN IF NEEDED   ____ Stop supplements until after surgery.    ____ Bring C-Pap to the hospital.

## 2017-06-12 NOTE — Pre-Procedure Instructions (Signed)
Pre-Op EKG called to Dr. Rosey Bath to review and stated "it's okay."

## 2017-06-13 ENCOUNTER — Other Ambulatory Visit: Payer: BLUE CROSS/BLUE SHIELD

## 2017-06-13 MED ORDER — DEXTROSE 5 % IV SOLN
1.0000 g | Freq: Once | INTRAVENOUS | Status: AC
  Administered 2017-06-14: 1 g via INTRAVENOUS
  Filled 2017-06-13: qty 1

## 2017-06-14 ENCOUNTER — Encounter
Admission: RE | Disposition: A | Discharge status: Home / Self Care | Payer: Self-pay | Source: Ambulatory Visit | Attending: Obstetrics & Gynecology

## 2017-06-14 ENCOUNTER — Ambulatory Visit: Payer: BLUE CROSS/BLUE SHIELD | Admitting: Registered Nurse

## 2017-06-14 ENCOUNTER — Ambulatory Visit
Admission: RE | Admit: 2017-06-14 | Discharge: 2017-06-14 | Disposition: A | Discharge status: Home / Self Care | Payer: BLUE CROSS/BLUE SHIELD | Source: Ambulatory Visit | Attending: Obstetrics & Gynecology | Admitting: Obstetrics & Gynecology

## 2017-06-14 DIAGNOSIS — Z7982 Long term (current) use of aspirin: Secondary | ICD-10-CM | POA: Insufficient documentation

## 2017-06-14 DIAGNOSIS — Z85820 Personal history of malignant melanoma of skin: Secondary | ICD-10-CM | POA: Insufficient documentation

## 2017-06-14 DIAGNOSIS — Z9889 Other specified postprocedural states: Secondary | ICD-10-CM | POA: Insufficient documentation

## 2017-06-14 DIAGNOSIS — E7800 Pure hypercholesterolemia, unspecified: Secondary | ICD-10-CM | POA: Insufficient documentation

## 2017-06-14 DIAGNOSIS — F17210 Nicotine dependence, cigarettes, uncomplicated: Secondary | ICD-10-CM | POA: Insufficient documentation

## 2017-06-14 DIAGNOSIS — N8312 Corpus luteum cyst of left ovary: Secondary | ICD-10-CM | POA: Insufficient documentation

## 2017-06-14 DIAGNOSIS — Z79890 Hormone replacement therapy (postmenopausal): Secondary | ICD-10-CM | POA: Insufficient documentation

## 2017-06-14 DIAGNOSIS — Z803 Family history of malignant neoplasm of breast: Secondary | ICD-10-CM | POA: Insufficient documentation

## 2017-06-14 DIAGNOSIS — Z90710 Acquired absence of both cervix and uterus: Secondary | ICD-10-CM | POA: Insufficient documentation

## 2017-06-14 DIAGNOSIS — Z8249 Family history of ischemic heart disease and other diseases of the circulatory system: Secondary | ICD-10-CM | POA: Insufficient documentation

## 2017-06-14 DIAGNOSIS — I252 Old myocardial infarction: Secondary | ICD-10-CM | POA: Insufficient documentation

## 2017-06-14 DIAGNOSIS — Z8349 Family history of other endocrine, nutritional and metabolic diseases: Secondary | ICD-10-CM | POA: Insufficient documentation

## 2017-06-14 DIAGNOSIS — E119 Type 2 diabetes mellitus without complications: Secondary | ICD-10-CM | POA: Insufficient documentation

## 2017-06-14 DIAGNOSIS — R1032 Left lower quadrant pain: Secondary | ICD-10-CM | POA: Diagnosis present

## 2017-06-14 DIAGNOSIS — Z888 Allergy status to other drugs, medicaments and biological substances status: Secondary | ICD-10-CM | POA: Insufficient documentation

## 2017-06-14 DIAGNOSIS — E039 Hypothyroidism, unspecified: Secondary | ICD-10-CM | POA: Insufficient documentation

## 2017-06-14 DIAGNOSIS — Z794 Long term (current) use of insulin: Secondary | ICD-10-CM | POA: Insufficient documentation

## 2017-06-14 DIAGNOSIS — Z833 Family history of diabetes mellitus: Secondary | ICD-10-CM | POA: Insufficient documentation

## 2017-06-14 DIAGNOSIS — N83299 Other ovarian cyst, unspecified side: Secondary | ICD-10-CM | POA: Diagnosis present

## 2017-06-14 HISTORY — PX: LAPAROSCOPIC SALPINGO OOPHERECTOMY: SHX5927

## 2017-06-14 LAB — ABO/RH: ABO/RH(D): O POS

## 2017-06-14 LAB — GLUCOSE, CAPILLARY
GLUCOSE-CAPILLARY: 135 mg/dL — AB (ref 65–99)
Glucose-Capillary: 316 mg/dL — ABNORMAL HIGH (ref 65–99)
Glucose-Capillary: 247 mg/dL — ABNORMAL HIGH (ref 65–99)

## 2017-06-14 SURGERY — SALPINGO-OOPHORECTOMY, LAPAROSCOPIC
Anesthesia: General | Laterality: Left

## 2017-06-14 MED ORDER — SUGAMMADEX SODIUM 200 MG/2ML IV SOLN
INTRAVENOUS | Status: AC
  Filled 2017-06-14: qty 2

## 2017-06-14 MED ORDER — ROCURONIUM BROMIDE 50 MG/5ML IV SOLN
INTRAVENOUS | Status: AC
  Filled 2017-06-14: qty 1

## 2017-06-14 MED ORDER — ACETAMINOPHEN 325 MG PO TABS: 650.0000 mg | ORAL_TABLET | ORAL | Status: DC | PRN

## 2017-06-14 MED ORDER — ONDANSETRON HCL 4 MG/2ML IJ SOLN
INTRAMUSCULAR | Status: AC
  Filled 2017-06-14: qty 2

## 2017-06-14 MED ORDER — OXYCODONE HCL 5 MG PO TABS: 5.0000 mg | ORAL_TABLET | Freq: Once | ORAL | Status: DC | PRN

## 2017-06-14 MED ORDER — INSULIN ASPART 100 UNIT/ML ~~LOC~~ SOLN
SUBCUTANEOUS | Status: AC
  Filled 2017-06-14: qty 1

## 2017-06-14 MED ORDER — ONDANSETRON HCL 4 MG/2ML IJ SOLN
INTRAMUSCULAR | Status: DC | PRN
  Administered 2017-06-14: 4 mg via INTRAVENOUS

## 2017-06-14 MED ORDER — INSULIN ASPART 100 UNIT/ML ~~LOC~~ SOLN
5.0000 [IU] | Freq: Once | SUBCUTANEOUS | Status: AC
  Administered 2017-06-14: 5 [IU] via SUBCUTANEOUS

## 2017-06-14 MED ORDER — FENTANYL CITRATE (PF) 100 MCG/2ML IJ SOLN
25.0000 ug | INTRAMUSCULAR | Status: DC | PRN
  Administered 2017-06-14: 25 ug via INTRAVENOUS

## 2017-06-14 MED ORDER — OXYCODONE-ACETAMINOPHEN 5-325 MG PO TABS: 1.0000 | ORAL_TABLET | ORAL | 0 refills | Status: DC | PRN

## 2017-06-14 MED ORDER — FENTANYL CITRATE (PF) 100 MCG/2ML IJ SOLN
INTRAMUSCULAR | Status: AC
  Administered 2017-06-14: 25 ug via INTRAVENOUS
  Filled 2017-06-14: qty 2

## 2017-06-14 MED ORDER — FENTANYL CITRATE (PF) 100 MCG/2ML IJ SOLN
INTRAMUSCULAR | Status: DC | PRN
  Administered 2017-06-14: 100 ug via INTRAVENOUS
  Administered 2017-06-14: 50 ug via INTRAVENOUS

## 2017-06-14 MED ORDER — ACETAMINOPHEN 650 MG RE SUPP
650.0000 mg | RECTAL | Status: DC | PRN
  Filled 2017-06-14: qty 1

## 2017-06-14 MED ORDER — BUPIVACAINE HCL (PF) 0.5 % IJ SOLN
INTRAMUSCULAR | Status: AC
  Filled 2017-06-14: qty 30

## 2017-06-14 MED ORDER — ACETAMINOPHEN NICU IV SYRINGE 10 MG/ML
INTRAVENOUS | Status: AC
  Filled 2017-06-14: qty 1

## 2017-06-14 MED ORDER — LIDOCAINE HCL (PF) 2 % IJ SOLN
INTRAMUSCULAR | Status: AC
  Filled 2017-06-14: qty 10

## 2017-06-14 MED ORDER — INSULIN REGULAR HUMAN 100 UNIT/ML IJ SOLN: 5.0000 [IU] | Freq: Once | INTRAMUSCULAR | Status: DC

## 2017-06-14 MED ORDER — BUPIVACAINE HCL (PF) 0.5 % IJ SOLN
INTRAMUSCULAR | Status: DC | PRN
  Administered 2017-06-14: 9 mL

## 2017-06-14 MED ORDER — PROPOFOL 10 MG/ML IV BOLUS
INTRAVENOUS | Status: AC
  Filled 2017-06-14: qty 20

## 2017-06-14 MED ORDER — DEXAMETHASONE SODIUM PHOSPHATE 10 MG/ML IJ SOLN
INTRAMUSCULAR | Status: AC
  Filled 2017-06-14: qty 1

## 2017-06-14 MED ORDER — MIDAZOLAM HCL 2 MG/2ML IJ SOLN
INTRAMUSCULAR | Status: DC | PRN
  Administered 2017-06-14: 2 mg via INTRAVENOUS

## 2017-06-14 MED ORDER — OXYCODONE HCL 5 MG/5ML PO SOLN: 5.0000 mg | Freq: Once | ORAL | Status: DC | PRN

## 2017-06-14 MED ORDER — MIDAZOLAM HCL 2 MG/2ML IJ SOLN
INTRAMUSCULAR | Status: AC
  Filled 2017-06-14: qty 2

## 2017-06-14 MED ORDER — LIDOCAINE HCL (CARDIAC) 20 MG/ML IV SOLN
INTRAVENOUS | Status: DC | PRN
  Administered 2017-06-14: 100 mg via INTRAVENOUS

## 2017-06-14 MED ORDER — SODIUM CHLORIDE 0.9 % IV SOLN
INTRAVENOUS | Status: DC
  Administered 2017-06-14: 12:00:00 via INTRAVENOUS

## 2017-06-14 MED ORDER — KETOROLAC TROMETHAMINE 30 MG/ML IJ SOLN
30.0000 mg | Freq: Four times a day (QID) | INTRAMUSCULAR | Status: DC
  Filled 2017-06-14: qty 1

## 2017-06-14 MED ORDER — PROPOFOL 10 MG/ML IV BOLUS
INTRAVENOUS | Status: DC | PRN
  Administered 2017-06-14: 150 mg via INTRAVENOUS

## 2017-06-14 MED ORDER — LACTATED RINGERS IV SOLN: INTRAVENOUS | Status: DC

## 2017-06-14 MED ORDER — FAMOTIDINE 20 MG PO TABS
ORAL_TABLET | ORAL | Status: AC
  Filled 2017-06-14: qty 1

## 2017-06-14 MED ORDER — ACETAMINOPHEN 10 MG/ML IV SOLN
INTRAVENOUS | Status: DC | PRN
  Administered 2017-06-14: 1000 mg via INTRAVENOUS

## 2017-06-14 MED ORDER — FAMOTIDINE 20 MG PO TABS
20.0000 mg | ORAL_TABLET | Freq: Once | ORAL | Status: AC
  Administered 2017-06-14: 20 mg via ORAL

## 2017-06-14 MED ORDER — MEPERIDINE HCL 50 MG/ML IJ SOLN: 6.2500 mg | INTRAMUSCULAR | Status: DC | PRN

## 2017-06-14 MED ORDER — DEXAMETHASONE SODIUM PHOSPHATE 10 MG/ML IJ SOLN
INTRAMUSCULAR | Status: DC | PRN
  Administered 2017-06-14: 10 mg via INTRAVENOUS

## 2017-06-14 MED ORDER — MORPHINE SULFATE (PF) 4 MG/ML IV SOLN: 1.0000 mg | INTRAVENOUS | Status: DC | PRN

## 2017-06-14 MED ORDER — SUGAMMADEX SODIUM 500 MG/5ML IV SOLN
INTRAVENOUS | Status: AC
  Filled 2017-06-14: qty 5

## 2017-06-14 MED ORDER — ROCURONIUM BROMIDE 100 MG/10ML IV SOLN
INTRAVENOUS | Status: DC | PRN
  Administered 2017-06-14: 40 mg via INTRAVENOUS

## 2017-06-14 MED ORDER — FENTANYL CITRATE (PF) 250 MCG/5ML IJ SOLN
INTRAMUSCULAR | Status: AC
  Filled 2017-06-14: qty 5

## 2017-06-14 MED ORDER — INSULIN ASPART 100 UNIT/ML ~~LOC~~ SOLN
SUBCUTANEOUS | Status: AC
  Administered 2017-06-14: 5 [IU] via SUBCUTANEOUS
  Filled 2017-06-14: qty 1

## 2017-06-14 MED ORDER — PROMETHAZINE HCL 25 MG/ML IJ SOLN: 6.2500 mg | INTRAMUSCULAR | Status: DC | PRN

## 2017-06-14 SURGICAL SUPPLY — 44 items
ADH SKN CLS APL DERMABOND .7 (GAUZE/BANDAGES/DRESSINGS) ×1
ANCHOR TIS RET SYS 235ML (MISCELLANEOUS) ×2 IMPLANT
BAG SPEC RTRVL LRG 6X4 10 (ENDOMECHANICALS) ×1
BAG TISS RTRVL C235 10X14 (MISCELLANEOUS) ×1
BLADE SURG SZ11 CARB STEEL (BLADE) ×3 IMPLANT
CANISTER SUCT 1200ML W/VALVE (MISCELLANEOUS) ×3 IMPLANT
CATH ROBINSON RED A/P 16FR (CATHETERS) ×1 IMPLANT
CHLORAPREP W/TINT 26ML (MISCELLANEOUS) ×3 IMPLANT
DERMABOND ADVANCED (GAUZE/BANDAGES/DRESSINGS) ×2
DERMABOND ADVANCED .7 DNX12 (GAUZE/BANDAGES/DRESSINGS) ×1 IMPLANT
DRAPE LAPAROTOMY 100X77 ABD (DRAPES) ×2 IMPLANT
DRSG TELFA 4X3 1S NADH ST (GAUZE/BANDAGES/DRESSINGS) IMPLANT
GAUZE SPONGE NON-WVN 2X2 STRL (MISCELLANEOUS) IMPLANT
GLOVE BIO SURGEON STRL SZ8 (GLOVE) ×9 IMPLANT
GLOVE INDICATOR 8.0 STRL GRN (GLOVE) ×9 IMPLANT
GOWN STRL REUS W/ TWL LRG LVL3 (GOWN DISPOSABLE) ×1 IMPLANT
GOWN STRL REUS W/ TWL XL LVL3 (GOWN DISPOSABLE) ×1 IMPLANT
GOWN STRL REUS W/TWL LRG LVL3 (GOWN DISPOSABLE) ×3
GOWN STRL REUS W/TWL XL LVL3 (GOWN DISPOSABLE) ×3
GRASPER SUT TROCAR 14GX15 (MISCELLANEOUS) ×3 IMPLANT
HANDLE YANKAUER SUCT BULB TIP (MISCELLANEOUS) ×2 IMPLANT
IRRIGATION STRYKERFLOW (MISCELLANEOUS) IMPLANT
IRRIGATOR STRYKERFLOW (MISCELLANEOUS)
IV LACTATED RINGERS 1000ML (IV SOLUTION) IMPLANT
KIT PINK PAD W/HEAD ARE REST (MISCELLANEOUS) ×3
KIT PINK PAD W/HEAD ARM REST (MISCELLANEOUS) ×1 IMPLANT
LABEL OR SOLS (LABEL) ×3 IMPLANT
NEEDLE VERESS 14GA 120MM (NEEDLE) ×3 IMPLANT
NS IRRIG 500ML POUR BTL (IV SOLUTION) ×3 IMPLANT
PACK GYN LAPAROSCOPIC (MISCELLANEOUS) ×3 IMPLANT
PAD PREP 24X41 OB/GYN DISP (PERSONAL CARE ITEMS) ×3 IMPLANT
POUCH SPECIMEN RETRIEVAL 10MM (ENDOMECHANICALS) ×2 IMPLANT
SCISSORS METZENBAUM CVD 33 (INSTRUMENTS) ×1 IMPLANT
SHEARS HARMONIC ACE PLUS 36CM (ENDOMECHANICALS) ×2 IMPLANT
SLEEVE ENDOPATH XCEL 5M (ENDOMECHANICALS) IMPLANT
SPONGE VERSALON 2X2 STRL (MISCELLANEOUS)
STRAP SAFETY BODY (MISCELLANEOUS) ×3 IMPLANT
SUT VIC AB 0 CT1 36 (SUTURE) ×3 IMPLANT
SUT VIC AB 2-0 UR6 27 (SUTURE) ×2 IMPLANT
SUT VIC AB 4-0 PS2 18 (SUTURE) ×2 IMPLANT
SYRINGE 10CC LL (SYRINGE) ×3 IMPLANT
TROCAR ENDO BLADELESS 11MM (ENDOMECHANICALS) ×2 IMPLANT
TROCAR XCEL NON-BLD 5MMX100MML (ENDOMECHANICALS) ×3 IMPLANT
TUBING INSUFFLATOR HI FLOW (MISCELLANEOUS) ×3 IMPLANT

## 2017-06-14 NOTE — Anesthesia Post-op Follow-up Note (Signed)
Anesthesia QCDR form completed.        

## 2017-06-14 NOTE — H&P (Signed)
History and Physical Interval Note:  06/14/2017 1:11 PM  Marcia [name]  has presented today for surgery, with the diagnosis of left ovarian cyst,left lower quadrant pain  The various methods of treatment have been discussed with the patient and family. After consideration of risks, benefits and other options for treatment, the patient has consented to  Procedure(s): LAPAROSCOPIC SALPINGO OOPHORECTOMY (Left) as a surgical intervention .  The patient's history has been reviewed, patient examined, no change in status, stable for surgery.  Pt has the following beta blocker history-  Not taking Beta Blocker.  I have reviewed the patient's chart and labs.  Questions were answered to the patient's satisfaction.    Hoyt Koch

## 2017-06-14 NOTE — Transfer of Care (Signed)
Immediate Anesthesia Transfer of Care Note  Patient: Marcia [name]  Procedure(s) Performed: Procedure(s): LAPAROSCOPIC SALPINGO OOPHORECTOMY (Left)  Patient Location: PACU  Anesthesia Type:General  Level of Consciousness: sedated  Airway & Oxygen Therapy: Patient Spontanous Breathing and Patient connected to face mask oxygen  Post-op Assessment: Report given to RN and Post -op Vital signs reviewed and stable  Post vital signs: Reviewed and stable  Last Vitals:  Vitals:   06/14/17 1128 06/14/17 1438  BP: 124/75 131/66  Pulse: 80 92  Resp: 17 20  Temp: 36.9 C (!) 36.2 C  SpO2: 73% 53%    Complications: No apparent anesthesia complications

## 2017-06-14 NOTE — Discharge Instructions (Signed)
AMBULATORY SURGERY  °DISCHARGE INSTRUCTIONS ° ° °1) The drugs that you were given will stay in your system until tomorrow so for the next 24 hours you should not: ° °A) Drive an automobile °B) Make any legal decisions °C) Drink any alcoholic beverage ° ° °2) You may resume regular meals tomorrow.  Today it is better to start with liquids and gradually work up to solid foods. ° °You may eat anything you prefer, but it is better to start with liquids, then soup and crackers, and gradually work up to solid foods. ° ° °3) Please notify your doctor immediately if you have any unusual bleeding, trouble breathing, redness and pain at the surgery site, drainage, fever, or pain not relieved by medication. °4)  ° °5) Your post-operative visit with Dr.                     °           °     is: Date:                        Time:   ° °Please call to schedule your post-operative visit. ° °6) Additional Instructions: ° ° ° ° ° °General Gynecological Post-Operative Instructions °You may expect to feel dizzy, weak, and drowsy for as long as 24 hours after receiving the medicine that made you sleep (anesthetic).  °Do not drive a car, ride a bicycle, participate in physical activities, or take public transportation until you are done taking narcotic pain medicines or as directed by your doctor.  °Do not drink alcohol or take tranquilizers.  °Do not take medicine that has not been prescribed by your doctor.  °Do not sign important papers or make important decisions while on narcotic pain medicines.  °Have a responsible person with you.  °CARE OF INCISION  °Keep incision clean and dry. °Take showers instead of baths until your doctor gives you permission to take baths.  °Avoid heavy lifting (more than 10 pounds/4.5 kilograms), pushing, or pulling.  °Avoid activities that may risk injury to your surgical site.  °No sexual intercourse or placement of anything in the vagina for 1 weeks or as instructed by your doctor. °If you have tubes  coming from the wound site, check with your doctor regarding appropriate care of the tubes. °Only take prescription or over-the-counter medicines  for pain, discomfort, or fever as directed by your doctor. Do not take aspirin. It can make you bleed. Take medicines (antibiotics) that kill germs if they are prescribed for you.  °Call the office or go to the ER if:  °You feel sick to your stomach (nauseous) and you start to throw up (vomit).  °You have trouble eating or drinking.  °You have an oral temperature above 101.  °You have constipation that is not helped by adjusting diet or increasing fluid intake. Pain medicines are a common cause of constipation.  °You have any other concerns. °SEEK IMMEDIATE MEDICAL CARE IF:  °You have persistent dizziness.  °You have difficulty breathing or a congested sounding (croupy) cough.  °You have an oral temperature above 102.5, not controlled by medicine.  °There is increasing pain or tenderness near or in the surgical site.  ° ° ° °

## 2017-06-14 NOTE — Anesthesia Postprocedure Evaluation (Signed)
Anesthesia Post Note  Patient: Marcia [name]  Procedure(s) Performed: LAPAROSCOPIC SALPINGO OOPHORECTOMY (Left )  Patient location during evaluation: PACU Anesthesia Type: General Level of consciousness: awake and alert Pain management: pain level controlled Vital Signs Assessment: post-procedure vital signs reviewed and stable Respiratory status: spontaneous breathing and respiratory function stable Cardiovascular status: stable Anesthetic complications: no     Last Vitals:  Vitals:   06/14/17 1455 06/14/17 1508  BP: 95/79 140/79  Pulse: 87   Resp: 13   Temp:  (!) 36.1 C  SpO2: 99%     Last Pain:  Vitals:   06/14/17 1508  TempSrc:   PainSc: 3                  KEPHART,WILLIAM K

## 2017-06-14 NOTE — Anesthesia Procedure Notes (Signed)

## 2017-06-14 NOTE — Anesthesia Preprocedure Evaluation (Signed)
Anesthesia Evaluation  Patient identified by MRN, date of birth, ID band Patient awake    Reviewed: Allergy & Precautions, NPO status , Patient's Chart, lab work & pertinent test results  History of Anesthesia Complications Negative for: history of anesthetic complications  Airway Mallampati: II  TM Distance: >3 FB Neck ROM: Full    Dental no notable dental hx.    Pulmonary neg sleep apnea, neg COPD, Current Smoker,    breath sounds clear to auscultation- rhonchi (-) wheezing      Cardiovascular Exercise Tolerance: Good (-) angina+ Past MI  (-) Cardiac Stents and (-) CABG  Rhythm:Regular Rate:Normal - Systolic murmurs and - Diastolic murmurs    Neuro/Psych PSYCHIATRIC DISORDERS Anxiety negative neurological ROS     GI/Hepatic negative GI ROS, Neg liver ROS,   Endo/Other  diabetes, Insulin Dependent, Oral Hypoglycemic AgentsHypothyroidism   Renal/GU negative Renal ROS     Musculoskeletal negative musculoskeletal ROS (+)   Abdominal (+) - obese,   Peds  Hematology negative hematology ROS (+)   Anesthesia Other Findings Past Medical History: No date: Diabetes mellitus No date: Hay fever     Comment:  Allergies No date: Heart murmur 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  1997: Myocardial infarction [facility])     Comment:  At age 54 05/09/2017: Ovarian cyst No date: Thyroid disease No date: Tobacco abuse No date: Vulvar lesion   Reproductive/Obstetrics                             Anesthesia Physical Anesthesia Plan  ASA: III  Anesthesia Plan: General   Post-op Pain Management:    Induction: Intravenous  PONV Risk Score and Plan: 1 and Dexamethasone and Ondansetron  Airway Management Planned: Oral ETT  Additional Equipment:   Intra-op  Plan:   Post-operative Plan: Extubation in OR  Informed Consent: I have reviewed the patients History and Physical, chart, labs and discussed the procedure including the risks, benefits and alternatives for the proposed anesthesia with the patient or authorized representative who has indicated his/her understanding and acceptance.   Dental advisory given  Plan Discussed with: CRNA and Anesthesiologist  Anesthesia Plan Comments:         Anesthesia Quick Evaluation

## 2017-06-14 NOTE — Op Note (Signed)
Operative Note:  PRE-OP DIAGNOSIS: left ovarian cyst,left lower quadrant pain   POST-OP DIAGNOSIS: left ovarian cyst,left lower quadrant pain   PROCEDURE: Procedure(s): LAPAROSCOPIC SALPINGO OOPHORECTOMY  SURGEON: Barnett Applebaum, MD, FACOG  ANESTHESIA: General endotracheal anesthesia  ESTIMATED BLOOD LOSS: less than 50   SPECIMENS: Left Tubes and Ovaries.  COMPLICATIONS: None  DISPOSITION: stable to PACU  FINDINGS: Intraabdominal adhesions were not noted. Cystic left ovary with torsion  PROCEDURE IN DETAIL: The patient was taken to the OR where anesthesia was administed. The patient was positioned supine. Foley placed.  Attention was turned to the patient's abdomen where a 11 mm skin incision was made in the umbilical fold, after injection of local anesthesia. The Veress step needle was carefully introduced into the peritoneal cavity with placement confirmed using the hanging drop technique. Pneumoperitoneum was obtained. The 11 mm port was then placed under direct visualization with the operative laparoscope The above noted findings. Trendelenburg.  5 mm trocar were then placed in the  RLQ lateral to the inferior epigastric blood vessels under direct visualization with the laparoscope. Left fallopian tubes/ovariy with cyst identified and the ovarian blood vessels/infundibulopelvic ligament pedicles are coagulated and ligated using the Harmonic scapel. This leads to amputation of adnexa as it had no points of being afixed to sidewall or other tissues.It is removed through a specimen bag. No injuries or bleeding was noted.  All instruments and ports were then removed from the abdomen after gas was expelled and patient was leveled. The skin was closed with skin adhesive. The foley catheter was removed. The patient tolerated the procedure well. All counts were correct x 2. The patient was transferred to the recovery room awake, alert and breathing independently.  Barnett Applebaum, MD,  [facility], [facility] 06/14/2017  2:25 PM

## 2017-06-15 ENCOUNTER — Encounter: Payer: Self-pay | Admitting: Obstetrics & Gynecology

## 2017-06-18 LAB — SURGICAL PATHOLOGY

## 2017-06-20 ENCOUNTER — Ambulatory Visit: Payer: BLUE CROSS/BLUE SHIELD | Admitting: Obstetrics & Gynecology

## 2017-06-20 ENCOUNTER — Ambulatory Visit (INDEPENDENT_AMBULATORY_CARE_PROVIDER_SITE_OTHER): Payer: BLUE CROSS/BLUE SHIELD | Admitting: Internal Medicine

## 2017-06-20 ENCOUNTER — Encounter: Payer: Self-pay | Admitting: Internal Medicine

## 2017-06-20 VITALS — BP 136/82 | HR 110 | Resp 16 | Ht 62.0 in | Wt 143.0 lb

## 2017-06-20 DIAGNOSIS — F17210 Nicotine dependence, cigarettes, uncomplicated: Secondary | ICD-10-CM | POA: Diagnosis not present

## 2017-06-20 DIAGNOSIS — R059 Cough, unspecified: Secondary | ICD-10-CM

## 2017-06-20 DIAGNOSIS — R05 Cough: Secondary | ICD-10-CM | POA: Diagnosis not present

## 2017-06-20 DIAGNOSIS — Z720 Tobacco use: Secondary | ICD-10-CM

## 2017-06-20 MED ORDER — FLUCONAZOLE 150 MG PO TABS: 150.0000 mg | ORAL_TABLET | Freq: Every day | ORAL | 0 refills | Status: DC

## 2017-06-20 NOTE — Patient Instructions (Signed)
--  Quitting smoking is the most important thing that you can do for your health.  --Quitting smoking will have greater affect on your health than any medicine that we can give you.   Smoking may be causing your cough, you must quit smoking!

## 2017-06-20 NOTE — Progress Notes (Signed)
[facility] Consultation      Assessment and Plan:  Intractable cough with acute respiratory disorder. -Appears to originate in her throat, the etiology is uncertain. Cough is been present for 2 years and has not responded to usual empiric therapies as detailed below. --Multiple empiric therapies have not been helpful.  Bronchoscopy negative. -Further therapies could include amitriptyline as well as smoking cessation.  Acute bronchitis, possibly fungal with Candida versus Aspergillus. -Bronchoscopy culture showed mold which may be either of the above, will await further culture results. -In the meantime we will start the patient empirically on fluconazole, she is asked to call us back in 2 weeks to see if she is any better.  If bronchoscopy cultures are positive for Aspergillus would need to consider treatment with voriconazole.  Dysphagia. -Patient describes episodes of choking eating or drinking, which occur daily. She has had a negative swallow evaluation. -Could consider an atypical presentation of vocal cord dysfunction which could be causing her problems.  Nicotine abuse. -Discussed possibility of smoking, contributing to her cough. Spent greater than 3 minutes and discussions    Date: 06/20/2017  MRN# [id number] DRUSCILLA PETSCH June 21, 1971    Marcia [name] is a 46 y.o. old female seen in consultation for chief complaint of:    Chief Complaint  Patient presents with  . follow up post Bronch    Pt reports no change since last visit. She still has really bad cough.    HPI:   The patient is a 46 year old female referred for persistent cough for 2 years, occurs mostly at night and after drinking liquids. She was seen by allergy, undergone allergy testing, and is on Singulair and Nasonex. Her allergy testing was negative.  She was tried on multiple inhalers (spiriva, advair, ventolin, proventil, atrovent, which apparently were not helpful.  She  apparently had a swallow study done which was normal and was on nexium singulair, xyzal, tessalon, cough syrup, nasal spray, cough drops which were not helpful. Prednisone has not been helpful.  When she was seen by ENT she had a scope which was apparently normal.  She describes the cough as originating in her throat. She has 3 dogs, and has had them for 5 years, they are not in bedroom.  She has always lived in this area. 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 She has never lived or worked on a farm.  Underwent bronchoscopy on 05/30/17, thus far cultures and AFB were negative.  Fungal cultures show only occasional mold, identification is pending.  **CT chest images personally reviewed, CT chest 05/14/17, tiny nodules in the right lower lobe, less than 5 mm, appear inflammatory. **Spirometry 04/12/2017; FVC is 94% predicted, FEV1 is 84% predicted, ratio is 73%. No bronchodilator was given. Overall test is normal Spirometry 11/24/2013; FVC 92%, FEV1 94%, ratio 79%, no significant changes with bronchodilator. **ABG 11/24/2013; 7.4/36/69/22.3 on room air; normal **CBC 05/19/2016; Eos = 0.   PMHX:   Past Medical History:  Diagnosis Date  . Diabetes mellitus   . Hay fever    Allergies  . Heart murmur   . History of HPV infection   . Hypercholesterolemia   . Hypothyroidism   . Melanoma of skin ([place]) 2010   In her vaginal area with surgical resection.   . Myocardial infarction ([place]) 1997   At age 53  . Ovarian cyst 05/09/2017  . Thyroid disease   . Tobacco abuse   . Vulvar lesion  Surgical Hx:  Past Surgical History:  Procedure Laterality Date  . ABDOMINAL HYSTERECTOMY  10/12/12  . ANKLE SURGERY Right 2010   Fractued ankle  . BREAST BIOPSY Bilateral @ 2013   core with clips  . TUBAL LIGATION  2004  . TUBAL LIGATION  2004, [facility]  . VULVAR LESION REMOVAL  10/2009 & 05/2012   Family Hx:  Family History  Problem Relation Age of Onset  . Diabetes Father   .  Hypertension Mother   . Hyperlipidemia Mother   . Diabetes Mother   . Cancer Other        Aunt-breast; grandmother- lung and ovarian  . Aneurysm Brother   . Breast cancer Neg Hx    Social Hx:   Social History   Tobacco Use  . Smoking status: Current Every Day Smoker    Packs/day: 0.25    Years: 22.00    Pack years: 5.50    Types: Cigarettes  . Smokeless tobacco: Never Used  . Tobacco comment: Down to .5ppd, trying to quit.  Allergic to chantix and e-cigs 02/05/14  Substance Use Topics  . Alcohol use: No    Alcohol/week: 0.0 oz  . Drug use: No   Medication:    Current Outpatient Medications:  .  ACCU-CHEK AVIVA PLUS test strip, TEST 2 TIMES A DAY, Disp: 100 each, Rfl: 6 .  aspirin EC 81 MG tablet, Take 81 mg by mouth daily., Disp: , Rfl:  .  atorvastatin (LIPITOR) 20 MG tablet, Take 20 mg by mouth at bedtime., Disp: , Rfl:  .  B-D ULTRAFINE III SHORT PEN 31G X 8 MM MISC, USE TWO NEEDLES DAILY, Disp: 100 each, Rfl: 8 .  Blood Glucose Monitoring Suppl (ACCU-CHEK AVIVA PLUS) W/DEVICE KIT, 1 Device by Does not apply route once., Disp: 1 kit, Rfl: 0 .  canagliflozin (INVOKANA) 300 MG TABS tablet, Take 300 mg by mouth daily before breakfast., Disp: , Rfl:  .  clobetasol ointment (TEMOVATE) 6.26 %, Apply 1 application topically every other day. At night, Disp: 30 g, Rfl: 3 .  cyclobenzaprine (FLEXERIL) 10 MG tablet, Take 10 mg by mouth 3 (three) times daily as needed for muscle spasms., Disp: , Rfl:  .  ergocalciferol (VITAMIN D2) 50000 units capsule, Take 50,000 Units by mouth every Wednesday., Disp: , Rfl:  .  insulin aspart (NOVOLOG FLEXPEN) 100 UNIT/ML FlexPen, 4 units with breakfast, and 4 units with the evening meal. (Patient taking differently: Inject into the skin 3 (three) times daily with meals. Sliding scale  131-180= 2 units 181-240= 4 units 241-300= 6 untis  301-350= 8 units  >351= 10units), Disp: 15 mL, Rfl: 11 .  Insulin Glargine (LANTUS SOLOSTAR) 100 UNIT/ML Solostar Pen,  Inject 27 Units into the skin every morning. (Patient taking differently: Inject 30 Units into the skin at bedtime. ), Disp: 5 pen, Rfl: PRN .  levothyroxine (SYNTHROID, LEVOTHROID) 112 MCG tablet, TAKE 1 TABLET (112 MCG TOTAL) BY MOUTH DAILY. (Patient not taking: Reported on 05/23/2017), Disp: 30 tablet, Rfl: 1 .  levothyroxine (SYNTHROID, LEVOTHROID) 75 MCG tablet, Take 75 mcg by mouth daily before breakfast., Disp: , Rfl:  .  ondansetron (ZOFRAN) 4 MG tablet, Take 1 tablet (4 mg total) by mouth daily as needed for nausea or vomiting. (Patient not taking: Reported on 06/12/2017), Disp: 10 tablet, Rfl: 0 .  oxyCODONE-acetaminophen (PERCOCET) 5-325 MG tablet, Take 1 tablet every 4 (four) hours as needed by mouth for moderate pain or severe pain., Disp: 42 tablet, Rfl:  0 .  oxyCODONE-acetaminophen (ROXICET) 5-325 MG tablet, Take 1 tablet by mouth every 6 (six) hours as needed. (Patient not taking: Reported on 05/23/2017), Disp: 12 tablet, Rfl: 0 No current facility-administered medications for this visit.   Facility-Administered Medications Ordered in Other Visits:  .  lidocaine (XYLOCAINE) 2 % jelly 1 application, 1 application, Topical, QID PRN, Gillis Ends, MD   Allergies:  Chantix [varenicline] and Septra [sulfamethoxazole-trimethoprim]  Review of Systems: Gen:  Denies  fever, sweats, chills HEENT: Denies blurred vision, double vision. bleeds, sore throat Cvc:  No dizziness, chest pain. Resp:   Deniessputum production, shortness of breath Gi: Denies swallowing difficulty, stomach pain. Gu:  Denies bladder incontinence, burning urine Ext:   No Joint pain, stiffness. Skin: No skin rash,  hives  Endoc:  No polyuria, polydipsia. Psych: No depression, insomnia. Other:  All other systems were reviewed with the patient and were negative other that what is mentioned in the HPI.   Physical Examination:   VS: LMP 10/12/2012   General Appearance: No distress  Neuro:without focal  findings,  speech normal,  HEENT: PERRLA, EOM intact.   copious wax Pulmonary: normal breath sounds, No wheezing.  CardiovascularNormal S1,S2.  No m/r/g.   Abdomen: Benign, Soft, non-tender. Renal:  No costovertebral tenderness  GU:  No performed at this time. Endoc: No evident thyromegaly, no signs of acromegaly. Skin:   warm, no rashes, no ecchymosis  Extremities: normal, no cyanosis, clubbing.  Other findings:    LABORATORY PANEL:   CBC No results for input(s): WBC, HGB, HCT, PLT in the last 168 hours. ------------------------------------------------------------------------------------------------------------------  Chemistries  No results for input(s): NA, K, CL, CO2, GLUCOSE, BUN, CREATININE, CALCIUM, MG, AST, ALT, ALKPHOS, BILITOT in the last 168 hours.  Invalid input(s): GFRCGP ------------------------------------------------------------------------------------------------------------------  Cardiac Enzymes No results for input(s): TROPONINI in the last 168 hours. ------------------------------------------------------------  RADIOLOGY:  No results found.     Thank  you for the consultation and for allowing [facility] to assist in the care of your patient. Our recommendations are noted above.  Please contact us if we can be of further service.   Marda Stalker, MD.  Board Certified in Internal Medicine, Pulmonary Medicine, [place], and Sleep Medicine.  [facility] Office Number: [phone]  Patricia Pesa, M.D.  Merton Border, M.D  06/20/2017

## 2017-06-22 ENCOUNTER — Other Ambulatory Visit: Payer: BLUE CROSS/BLUE SHIELD

## 2017-06-22 ENCOUNTER — Ambulatory Visit: Payer: BLUE CROSS/BLUE SHIELD | Admitting: Obstetrics & Gynecology

## 2017-06-25 ENCOUNTER — Ambulatory Visit: Payer: BLUE CROSS/BLUE SHIELD | Admitting: Obstetrics & Gynecology

## 2017-06-25 ENCOUNTER — Encounter: Payer: Self-pay | Admitting: Obstetrics & Gynecology

## 2017-06-25 VITALS — BP 100/70 | HR 100 | Ht 62.0 in | Wt 140.0 lb

## 2017-06-25 DIAGNOSIS — N83299 Other ovarian cyst, unspecified side: Secondary | ICD-10-CM

## 2017-06-25 MED ORDER — ESTRADIOL 0.05 MG/24HR TD PTTW: 1.0000 | MEDICATED_PATCH | TRANSDERMAL | 12 refills | Status: DC

## 2017-06-25 NOTE — Progress Notes (Signed)
  Postoperative Follow-up Patient presents post op from left oophorectomy for cyst and pain, 2 weeks ago.  Subjective: Patient reports marked improvement in her preop symptoms. Eating a regular diet without difficulty. The patient is not having any pain.  Activity: normal activities of daily living. Patient reports vaginal sx's of None  Objective: BP 100/70   Pulse 100   Ht 5\' 2"  (1.575 m)   Wt 140 lb (63.5 kg)   LMP 10/12/2012   BMI 25.61 kg/m  OBGyn Exam  Assessment: s/p :  left oophorectomy stable  Plan: Patient has done well after surgery with no apparent complications.  I have discussed the post-operative course to date, and the expected progress moving forward.  The patient understands what complications to be concerned about.  I will see the patient in routine follow up, or sooner if needed.    Activity plan: No restriction.  HRT I have discussed HRT with the patient in detail.  The risk/benefits of it were reviewed.  She understands that during menopause Estrogen decreases dramatically and that this results in an increased risk of cardiovascular disease as well as osteoporosis.  We have also discussed the fact that hot flashes often result from a decrease in Estrogen, and that by replacing Estrogen, they can often be alleviated.  We have discussed skin, vaginal and urinary tract changes that may also take place from this drop in Estrogen.  Emotional changes have also been linked to Estrogen and we have briefly discussed this.  The benefits of HRT including decrease in hot flashes, vaginal dryness, and osteoporosis were discussed.  The emotional benefit and a possible change in her cardiovascular risk profile was also reviewed.  The risks associated with Hormone Replacement Therapy were also reviewed.  The use of unopposed Estrogen and its relationship to endometrial cancer was discussed.  The addition of Progesterone and its beneficial effect on endometrial cancer was also noted.   The fact that there has been no consistent definitive studies showing an increase in breast cancer in women who use HRT was discussed with the patient.  The possible side effects including breast tenderness, fluid retention, mood changes and vaginal bleeding were discussed.  The patient was informed that this is an elective medication and that she may choose not to take Hormone Replacement Therapy.  Literature on HRT was given, and I believe that after answering all of the patient's questions, she has an adequate and informed understanding of HRT.  Special emphasis on the WHI study, as well as several studies since that pertaining to the risks and benefits of estrogen replacement therapy were compared.  The possible limitations of these studies were discussed including the age stratification of the WHI study.  The possible role of Progesterone in these studies was discussed in detail.  I believe that the patient has an informed knowledge of the risks and benefits of HRT.  I have specifically discussed WHI findings and current updates.  Different type of hormone formulation and methods of taking hormone replacement therapy discussed.   Plan patch estrogen therapy with monitoring  Hoyt Koch 06/25/2017, 2:11 PM

## 2017-06-25 NOTE — Patient Instructions (Signed)
Estradiol skin patches What is this medicine? ESTRADIOL (es tra DYE ole) skin patches contain an estrogen. It is mostly used as hormone replacement in menopausal women. It helps to treat hot flashes and prevent osteoporosis. It is also used to treat women with low estrogen levels or those who have had their ovaries removed. This medicine may be used for other purposes; ask your health care provider or pharmacist if you have questions. COMMON BRAND NAME(S): Alora, Climara, Esclim, Estraderm, FemPatch, Menostar, Minivelle, Vivelle, Vivelle-Dot What should I tell my health care provider before I take this medicine? They need to know if you have any of these conditions: -abnormal vaginal bleeding -blood vessel disease or blood clots -breast, cervical, endometrial, ovarian, liver, or uterine cancer -dementia -diabetes -gallbladder disease -heart disease or recent heart attack -high blood pressure -high cholesterol -high level of calcium in the blood -hysterectomy -kidney disease -liver disease -migraine headaches -protein C deficiency -protein S deficiency -stroke -systemic lupus erythematosus (SLE) -tobacco smoker -an unusual or allergic reaction to estrogens, other hormones, medicines, foods, dyes, or preservatives -pregnant or trying to get pregnant -breast-feeding How should I use this medicine? This medicine is for external use only. Follow the directions on the prescription label. Tear open the pouch, do not use scissors. Remove the stiff protective liner covering the adhesive. Try not to touch the adhesive. Apply the patch, sticky side to the skin, to an area that is clean, dry and hairless. Avoid injured, irritated, calloused, or scarred areas. Do not apply the skin patches to your breasts or around the waistline. Use a different site each time to prevent skin irritation. Do not cut or trim the patch. Do not stop using except on the advice of your doctor or health care professional.  Do not wear more than one patch at a time unless you are told to do so by your doctor or health care professional. Contact your pediatrician regarding the use of this medicine in children. Special care may be needed. A patient package insert for the product will be given with each prescription and refill. Read this sheet carefully each time. The sheet may change frequently. Overdosage: If you think you have taken too much of this medicine contact a poison control center or emergency room at once. NOTE: This medicine is only for you. Do not share this medicine with others. What if I miss a dose? If you miss a dose, apply it as soon as you can. If it is almost time for your next dose, apply only that dose. Do not apply double or extra doses. What may interact with this medicine? Do not take this medicine with any of the following medications: -aromatase inhibitors like aminoglutethimide, anastrozole, exemestane, letrozole, testolactone This medicine may also interact with the following medications: -carbamazepine -certain antibiotics used to treat infections -certain barbiturates used for inducing sleep or treating seizures -grapefruit juice -medicines for fungus infections like itraconazole and ketoconazole -raloxifene or tamoxifen -rifabutin, rifampin, or rifapentine -ritonavir -St. John's Wort This list may not describe all possible interactions. Give your health care provider a list of all the medicines, herbs, non-prescription drugs, or dietary supplements you use. Also tell them if you smoke, drink alcohol, or use illegal drugs. Some items may interact with your medicine. What should I watch for while using this medicine? Visit your doctor or health care professional for regular checks on your progress. You will need a regular breast and pelvic exam and Pap smear while on this medicine. You should also   discuss the need for regular mammograms with your health care professional, and follow  his or her guidelines for these tests. This medicine can make your body retain fluid, making your fingers, hands, or ankles swell. Your blood pressure can go up. Contact your doctor or health care professional if you feel you are retaining fluid. If you have any reason to think you are pregnant, stop taking this medicine right away and contact your doctor or health care professional. Smoking increases the risk of getting a blood clot or having a stroke while you are taking this medicine, especially if you are more than 46 years old. You are strongly advised not to smoke. If you wear contact lenses and notice visual changes, or if the lenses begin to feel uncomfortable, consult your eye doctor or health care professional. This medicine can increase the risk of developing a condition (endometrial hyperplasia) that may lead to cancer of the lining of the uterus. Taking progestins, another hormone drug, with this medicine lowers the risk of developing this condition. Therefore, if your uterus has not been removed (by a hysterectomy), your doctor may prescribe a progestin for you to take together with your estrogen. You should know, however, that taking estrogens with progestins may have additional health risks. You should discuss the use of estrogens and progestins with your health care professional to determine the benefits and risks for you. If you are going to have surgery or an MRI, you may need to stop taking this medicine. Consult your health care professional for advice before you schedule the surgery. Contact with water while you are swimming, using a sauna, bathing, or showering may cause the patch to fall off. If your patch falls off reapply it. If you cannot reapply the patch, apply a new patch to another area and continue to follow your usual dose schedule. What side effects may I notice from receiving this medicine? Side effects that you should report to your doctor or health care professional as  soon as possible: -allergic reactions like skin rash, itching or hives, swelling of the face, lips, or tongue -breast tissue changes or discharge -changes in vision -chest pain -confusion, trouble speaking or understanding -dark urine -general ill feeling or flu-like symptoms -light-colored stools -nausea, vomiting -pain, swelling, warmth in the leg -right upper belly pain -severe headaches -shortness of breath -sudden numbness or weakness of the face, arm or leg -trouble walking, dizziness, loss of balance or coordination -unusual vaginal bleeding -yellowing of the eyes or skin Side effects that usually do not require medical attention (report to your doctor or health care professional if they continue or are bothersome): -hair loss -increased hunger or thirst -increased urination -symptoms of vaginal infection like itching, irritation or unusual discharge -unusually weak or tired This list may not describe all possible side effects. Call your doctor for medical advice about side effects. You may report side effects to FDA at 1-800-FDA-1088. Where should I keep my medicine? Keep out of the reach of children. Store at room temperature below 30 degrees C (86 degrees F). Do not store any patches that have been removed from their protective pouch. Throw away any unused medicine after the expiration date. Dispose of used patches properly. Since used patches may still contain active hormones, fold the patch in half so that it sticks to itself prior to disposal. NOTE: This sheet is a summary. It may not cover all possible information. If you have questions about this medicine, talk to your doctor, pharmacist, or   health care provider.  2018 Elsevier/Gold Standard (2016-02-15 12:58:11)  

## 2017-06-29 ENCOUNTER — Other Ambulatory Visit (HOSPITAL_COMMUNITY)
Admission: RE | Admit: 2017-06-29 | Discharge: 2017-06-29 | Disposition: A | Discharge status: Home / Self Care | Payer: BLUE CROSS/BLUE SHIELD | Source: Ambulatory Visit | Attending: Internal Medicine | Admitting: Internal Medicine

## 2017-06-30 LAB — MISC LABCORP TEST (SEND OUT): LABCORP TEST CODE: 8474

## 2017-07-11 LAB — MOLD ORGANISM REFLEX

## 2017-07-12 ENCOUNTER — Other Ambulatory Visit: Payer: Self-pay | Admitting: Internal Medicine

## 2017-07-12 ENCOUNTER — Telehealth: Payer: Self-pay | Admitting: Internal Medicine

## 2017-07-12 LAB — ORGANISM IDENTIFICATION, MOLD

## 2017-07-12 MED ORDER — VORICONAZOLE 200 MG PO TABS: 200.0000 mg | ORAL_TABLET | Freq: Two times a day (BID) | ORAL | 2 refills | Status: AC

## 2017-07-13 ENCOUNTER — Telehealth: Payer: Self-pay | Admitting: *Deleted

## 2017-07-13 NOTE — Telephone Encounter (Signed)
PA request submitted for Voriconazole. [id number] Submitted via cover my meds. Pending decision.

## 2017-07-13 NOTE — Telephone Encounter (Signed)
-----   Message from Laverle Hobby, MD sent at 07/12/2017  5:25 PM EST ----- Regarding: pt needs followup.  Explained to patient bronch results and sent in script for voriconazole. Please have her follow up 1 month after starting the rx. Will need basic metabolic panel and aspergillus IgE 1 week before.

## 2017-07-13 NOTE — Telephone Encounter (Signed)
PA-initiated for Voriconazole. Key: Gideon allow 3 business days for decision.

## 2017-07-14 LAB — ACID FAST CULTURE WITH REFLEXED SENSITIVITIES: ACID FAST CULTURE - AFSCU3: NEGATIVE

## 2017-07-15 LAB — CULTURE, FUNGUS WITHOUT SMEAR

## 2017-07-18 NOTE — Telephone Encounter (Signed)
[employer] has requested additional information. Supporting labs and office note faxed. Pending decision.

## 2017-07-18 NOTE — Telephone Encounter (Signed)
Needs dx for PA for Voriconazole.

## 2017-07-19 ENCOUNTER — Other Ambulatory Visit: Payer: Self-pay | Admitting: *Deleted

## 2017-07-19 ENCOUNTER — Telehealth: Payer: Self-pay | Admitting: *Deleted

## 2017-07-19 DIAGNOSIS — R059 Cough, unspecified: Secondary | ICD-10-CM

## 2017-07-19 DIAGNOSIS — R05 Cough: Secondary | ICD-10-CM

## 2017-07-19 NOTE — Progress Notes (Signed)
Lab orders placed per DR.

## 2017-07-19 NOTE — Telephone Encounter (Signed)
Approved today  Effective from 07/13/2017 through 01/08/2018. Pharmacy notified.  Nothing further needed.

## 2017-07-19 NOTE — Telephone Encounter (Signed)
-----   Message from Laverle Hobby, MD sent at 07/12/2017  5:25 PM EST ----- Regarding: pt needs followup.  Explained to patient bronch results and sent in script for voriconazole. Please have her follow up 1 month after starting the rx. Will need basic metabolic panel and aspergillus IgE 1 week before.

## 2017-07-19 NOTE — Telephone Encounter (Signed)
Appt scheduled 08/21/16. Patient aware that she needs labs 1 week prior to appt. Patient aware medication has been approved by ins.

## 2017-07-20 ENCOUNTER — Telehealth: Payer: Self-pay | Admitting: Internal Medicine

## 2017-07-20 NOTE — Telephone Encounter (Signed)
Pt calling stating the antibiotic was not called in yesterday when we called her  She need [place] to send it to [facility] on [address]

## 2017-07-20 NOTE — Telephone Encounter (Signed)
Patient confirmed pharmacy is [facility] not [facility]. [facility] taken [facility] out [facility] per paitent. Patient advised that [facility] can pull rx [facility]. Nothing further needed.

## 2017-08-13 ENCOUNTER — Telehealth: Payer: Self-pay | Admitting: Internal Medicine

## 2017-08-13 ENCOUNTER — Other Ambulatory Visit: Payer: Self-pay | Admitting: Internal Medicine

## 2017-08-13 ENCOUNTER — Other Ambulatory Visit
Admission: RE | Admit: 2017-08-13 | Discharge: 2017-08-13 | Disposition: A | Discharge status: Home / Self Care | Payer: BLUE CROSS/BLUE SHIELD | Source: Ambulatory Visit | Attending: Internal Medicine | Admitting: Internal Medicine

## 2017-08-13 DIAGNOSIS — R059 Cough, unspecified: Secondary | ICD-10-CM

## 2017-08-13 DIAGNOSIS — R05 Cough: Secondary | ICD-10-CM | POA: Insufficient documentation

## 2017-08-13 LAB — BASIC METABOLIC PANEL
ANION GAP: 11 (ref 5–15)
BUN: 19 mg/dL (ref 6–20)
CALCIUM: 9.7 mg/dL (ref 8.9–10.3)
CO2: 28 mmol/L (ref 22–32)
Chloride: 102 mmol/L (ref 101–111)
Creatinine, Ser: 0.77 mg/dL (ref 0.44–1.00)
GLUCOSE: 123 mg/dL — AB (ref 65–99)
POTASSIUM: 4.3 mmol/L (ref 3.5–5.1)
SODIUM: 141 mmol/L (ref 135–145)

## 2017-08-13 NOTE — Progress Notes (Signed)
Lab called there was a issue with IGe Aspergillus and order needed to be changed to IGg. Dr. Ashby Dawes has changed order.

## 2017-08-13 NOTE — Telephone Encounter (Signed)
[facility] pt is there waiting to get labs drawn  But they have some questions on the labs and would like a call back  Please call

## 2017-08-13 NOTE — Telephone Encounter (Signed)
Lab informed of new order placement.

## 2017-08-20 NOTE — Progress Notes (Signed)
[facility] Consultation      Assessment and Plan:  Intractable cough with acute respiratory disorder. -Cough which has been present for about 2 years, not responding to empiric therapy. S/p bronchoscopy which grew aspergillus.  --Started on voriconazole, now complete.  -Discussed that no cause for her cough is found, this is likely related to chronic bronchitis and/or chronic sinus drainage.  Would continue empiric therapies for cough as needed.  Recommend smoking cessation.  Lung Nodule.  --Tiny right lung nodule, 76m or less on 05/14/17.  -Repeat CT chest in 6 months.  Dysphagia. -Patient describes episodes of choking eating or drinking, which occur daily. She has had a negative swallow evaluation. -Could consider an atypical presentation of vocal cord dysfunction which could be causing her problems.  Nicotine abuse. -Discussed possibility of smoking, continuing to her cough. Spent greater than 3 minutes and discussions    Date: 08/20/2017  MRN# [id number] CONNON5[name], Marcia [name]   Marcia [name] a 47y.o. old female seen in consultation for chief complaint of:    Chief Complaint  Patient presents with  . Follow-up    f/u bronch w/labs: dry cough    HPI:   The patient is a 47year old female referred for persistent cough. She was recently seen by Dr. VPryor Ochoa ENT s/p negative scope. Seen allergist, undergone allergy testing. Her allergy testing was negative.  Tried on multiple inhalers (spiriva, advair, ventolin, proventil, atrovent) which apparently were not helpful.  She apparently had a swallow study done which was normal and was on nexium singulair, xyzal, tessalon, cough syrup, nasal spray, cough drops which were not helpful. Prednisone has not been helpful.   She has since undergone a bronchoscopy which grew aspergillus. Afumigatus IgG negative. She was stared on voriconazole, for one month and has 2 days left. The cough is the same. She  down to smoking to 3 cigs per day and is planning to stop by the end of next month.     She describes the cough as originating in her throat. She has 3 dogs, and has had them for 5 years, they are not in bedroom.   **Spirometry 04/12/2017; FVC is 94% predicted, FEV1 is 84% predicted, ratio is 73%. No bronchodilator was given. Overall test is normal Spirometry 11/24/2013; FVC 92%, FEV1 94%, ratio 79%, no significant changes with bronchodilator. **ABG 11/24/2013; 7.4/36/69/22.3 on room air; normal **CBC 05/19/2016; Eos = 0.  Social Hx:   Social History   Tobacco Use  . Smoking status: Current Every Day Smoker    Packs/day: 0.25    Years: 22.00    Pack years: 5.50    Types: Cigarettes  . Smokeless tobacco: Never Used  . Tobacco comment: Down to .5ppd, trying to quit.  Allergic to chantix and e-cigs 02/05/14  Substance Use Topics  . Alcohol use: No    Alcohol/week: 0.0 oz  . Drug use: No   Medication:    Current Outpatient Medications:  .  ACCU-CHEK AVIVA PLUS test strip, TEST 2 TIMES A DAY, Disp: 100 each, Rfl: 6 .  aspirin EC 81 MG tablet, Take 81 mg by mouth daily., Disp: , Rfl:  .  atorvastatin (LIPITOR) 20 MG tablet, Take 20 mg by mouth at bedtime., Disp: , Rfl:  .  B-D ULTRAFINE III SHORT PEN 31G X 8 MM MISC, USE TWO NEEDLES DAILY, Disp: 100 each, Rfl: 8 .  Blood Glucose Monitoring Suppl (ACCU-CHEK AVIVA PLUS) W/DEVICE KIT, 1 Device by Does not  apply route once., Disp: 1 kit, Rfl: 0 .  canagliflozin (INVOKANA) 300 MG TABS tablet, Take 300 mg by mouth daily before breakfast., Disp: , Rfl:  .  clobetasol ointment (TEMOVATE) 2.29 %, Apply 1 application topically every other day. At night, Disp: 30 g, Rfl: 3 .  cyclobenzaprine (FLEXERIL) 10 MG tablet, Take 10 mg by mouth 3 (three) times daily as needed for muscle spasms., Disp: , Rfl:  .  ergocalciferol (VITAMIN D2) 50000 units capsule, Take 50,000 Units by mouth every Wednesday., Disp: , Rfl:  .  estradiol (MINIVELLE) 0.[name] MG/24HR  patch, Place 1 patch (0.[name] mg total) 2 (two) times a week onto the skin., Disp: 8 patch, Rfl: 12 .  fluconazole (DIFLUCAN) 150 MG tablet, Take 1 tablet (150 mg total) daily by mouth., Disp: 14 tablet, Rfl: 0 .  insulin aspart (NOVOLOG FLEXPEN) 100 UNIT/ML FlexPen, 4 units with breakfast, and 4 units with the evening meal. (Patient taking differently: Inject into the skin 3 (three) times daily with meals. Sliding scale  131-180= 2 units 181-240= 4 units 241-300= 6 untis  301-350= 8 units  >351= 10units), Disp: 15 mL, Rfl: 11 .  Insulin Glargine (LANTUS SOLOSTAR) 100 UNIT/ML Solostar Pen, Inject 27 Units into the skin every morning. (Patient taking differently: Inject 30 Units into the skin at bedtime. ), Disp: 5 pen, Rfl: PRN .  levothyroxine (SYNTHROID, LEVOTHROID) 75 MCG tablet, Take 75 mcg by mouth daily before breakfast., Disp: , Rfl:  .  voriconazole (VFEND) 200 MG tablet, Take 1 tablet (200 mg total) by mouth 2 (two) times daily., Disp: 60 tablet, Rfl: 2 No current facility-administered medications for this visit.   Facility-Administered Medications Ordered in Other Visits:  .  lidocaine (XYLOCAINE) 2 % jelly 1 application, 1 application, Topical, QID PRN, Gillis Ends, MD   Allergies:  Chantix [varenicline] and Septra [sulfamethoxazole-trimethoprim]  Review of Systems: Gen:  Denies  fever, sweats, chills HEENT: Denies blurred vision, double vision. bleeds, sore throat Cvc:  No dizziness, chest pain. Resp:   Deniessputum production, shortness of breath Gi: Denies swallowing difficulty, stomach pain. Gu:  Denies bladder incontinence, burning urine Ext:   No Joint pain, stiffness. Skin: No skin rash,  hives  Endoc:  No polyuria, polydipsia. Psych: No depression, insomnia. Other:  All other systems were reviewed with the patient and were negative other that what is mentioned in the HPI.   Physical Examination:   VS: BP 120/84 (BP Location: Left Arm, Cuff Size: Normal)    Pulse 91   Ht '5\' 2"'$  (1.575 m)   Wt 150 lb (68 kg)   LMP 10/12/2012   SpO2 100%   BMI 27.44 kg/m   General Appearance: No distress  Neuro:without focal findings,  speech normal,  HEENT: PERRLA, EOM intact.   copious wax Pulmonary: normal breath sounds, No wheezing.  CardiovascularNormal S1,S2.  No m/r/g.   Abdomen: Benign, Soft, non-tender. Renal:  No costovertebral tenderness  GU:  No performed at this time. Endoc: No evident thyromegaly, no signs of acromegaly. Skin:   warm, no rashes, no ecchymosis  Extremities: normal, no cyanosis, clubbing.  Other findings:    LABORATORY PANEL:   CBC No results for input(s): WBC, HGB, HCT, PLT in the last 168 hours. ------------------------------------------------------------------------------------------------------------------  Chemistries  No results for input(s): NA, K, CL, CO2, GLUCOSE, BUN, CREATININE, CALCIUM, MG, AST, ALT, ALKPHOS, BILITOT in the last 168 hours.  Invalid input(s): GFRCGP ------------------------------------------------------------------------------------------------------------------  Cardiac Enzymes No results for input(s): TROPONINI in the last  168 hours. ------------------------------------------------------------  RADIOLOGY:  No results found.     Thank  you for the consultation and for allowing [facility] to assist in the care of your patient. Our recommendations are noted above.  Please contact us if we can be of further service.   Marda Stalker, MD.  Board Certified in Internal Medicine, Pulmonary Medicine, [place], and Sleep Medicine.  [facility] Office Number: [phone]  Patricia Pesa, M.D.  Merton Border, M.D  08/20/2017

## 2017-08-21 ENCOUNTER — Encounter: Payer: Self-pay | Admitting: Internal Medicine

## 2017-08-21 ENCOUNTER — Ambulatory Visit (INDEPENDENT_AMBULATORY_CARE_PROVIDER_SITE_OTHER): Payer: BLUE CROSS/BLUE SHIELD | Admitting: Internal Medicine

## 2017-08-21 VITALS — BP 120/84 | HR 91 | Ht 62.0 in | Wt 150.0 lb

## 2017-08-21 DIAGNOSIS — R918 Other nonspecific abnormal finding of lung field: Secondary | ICD-10-CM | POA: Diagnosis not present

## 2017-08-21 DIAGNOSIS — R911 Solitary pulmonary nodule: Secondary | ICD-10-CM

## 2017-08-21 DIAGNOSIS — R05 Cough: Secondary | ICD-10-CM | POA: Diagnosis not present

## 2017-08-21 DIAGNOSIS — Z720 Tobacco use: Secondary | ICD-10-CM

## 2017-08-21 DIAGNOSIS — R059 Cough, unspecified: Secondary | ICD-10-CM

## 2017-08-21 DIAGNOSIS — F17210 Nicotine dependence, cigarettes, uncomplicated: Secondary | ICD-10-CM | POA: Diagnosis not present

## 2017-08-21 NOTE — Patient Instructions (Addendum)
You have chronic bronchitis.  You will need a CT scan in 6 months for lung nodules.

## 2017-08-31 LAB — MISC LABCORP TEST (SEND OUT): Labcorp test code: 660092

## 2017-10-10 ENCOUNTER — Ambulatory Visit: Payer: BLUE CROSS/BLUE SHIELD

## 2017-10-26 ENCOUNTER — Other Ambulatory Visit: Payer: Self-pay | Admitting: Nurse Practitioner

## 2017-10-26 DIAGNOSIS — Z1231 Encounter for screening mammogram for malignant neoplasm of breast: Secondary | ICD-10-CM

## 2017-10-31 ENCOUNTER — Inpatient Hospital Stay: Payer: BLUE CROSS/BLUE SHIELD

## 2017-11-14 ENCOUNTER — Inpatient Hospital Stay: Payer: BLUE CROSS/BLUE SHIELD | Attending: Obstetrics and Gynecology | Admitting: Obstetrics and Gynecology

## 2017-11-14 ENCOUNTER — Encounter: Payer: Self-pay | Admitting: Obstetrics and Gynecology

## 2017-11-14 VITALS — BP 127/76 | HR 96 | Temp 98.1°F | Resp 18 | Ht 62.0 in | Wt 150.9 lb

## 2017-11-14 DIAGNOSIS — A630 Anogenital (venereal) warts: Secondary | ICD-10-CM | POA: Diagnosis not present

## 2017-11-14 DIAGNOSIS — F17210 Nicotine dependence, cigarettes, uncomplicated: Secondary | ICD-10-CM | POA: Diagnosis not present

## 2017-11-14 DIAGNOSIS — Z90710 Acquired absence of both cervix and uterus: Secondary | ICD-10-CM | POA: Diagnosis not present

## 2017-11-14 DIAGNOSIS — D071 Carcinoma in situ of vulva: Secondary | ICD-10-CM | POA: Insufficient documentation

## 2017-11-14 DIAGNOSIS — N901 Moderate vulvar dysplasia: Secondary | ICD-10-CM | POA: Diagnosis not present

## 2017-11-14 DIAGNOSIS — Z90722 Acquired absence of ovaries, bilateral: Secondary | ICD-10-CM | POA: Diagnosis not present

## 2017-11-14 MED ORDER — FLUCONAZOLE 150 MG PO TABS: 150.0000 mg | ORAL_TABLET | Freq: Every day | ORAL | 0 refills | Status: AC

## 2017-11-14 NOTE — Progress Notes (Signed)
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 woman who presents today for follow up of vulvar dysplasia VINII.   Most recently seen in clinic on 04/11/17.  At that time, she had a new 10m wart near introitus at 7:00 which was removed.  Wet prep positive for yeast.  Negative for trichomoniasis and clue cells.   Pathology  04/11/2017:  DIAGNOSIS:  A. VULVA, RIGHT; BIOPSY/REMOVAL:  - LOW-GRADE SQUAMOUS INTRAEPITHELIAL LESION (CONDYLOMA).  - CANDIDIASIS.  - NEGATIVE FOR HIGH-GRADE SQUAMOUS INTRAEPITHELIAL LESION AND MALIGNANCY.  Comment:  There are focal intraepithelial neutrophils, and PAS stain for fungi demonstrates a few intraepithelial pseudohyphae.   She was treated with Diflucan 150 mg daily X 3 days and used clobetasol topically for persistent symptoms.   She had her left ovary removed 05/2017 with Dr. HKenton Kingfisher   Today, she reports continued vulvar irritation and itching that started again, approximately 3 weeks ago. Otherwise she feels well. Unfortunately, she continues to smoke half a pack of cigarettes per day. Her blood sugars continue to remain elevated.   Treatment History:  Prior hysterectomy   08/2009     VIN II                 laser vaporization 03/2010     VIN II                 Aldara started,well tolerated with reduced frequency, 04/2011     infection peri-clitoral area, responding to antibiotic therapy 06/2012   recurrent VIN III on biopsy of left labia minora, PAP negative. 1/14     laser vaporization of raised epithelium consistent with VIN, mainly on the anterior portion of the labia minora. A few other scattered areas around the external genitalia.  04/07/15:  Had laser ablation in OR with Dr STheora Gianotti  "Acetowhite epithelium involving bilateral labia minora of the vulva each approximately 3 cm. On the left the abnormalities extending to the medial aspect of the labia minora. There was a another patch  of acetowhite on right vulva at 3 o'clock as well as another area at the perineum. There were scattered small 1-2 mm areas of acetowhite extending toward the anus."  02/14/17   Vulvar biopsies, right VIN2 grossly removed, left keratosis. DIAGNOSIS:  VULVA, RIGHT ANTERIOR; BIOPSY:  - KERATOSIS AND MILD SQUAMOUS DYSPLASIA.  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  - A PERIPHERAL EDGE IS INVOLVED.   GYN History:   Gravida 3    Para 2    Age at Menarche 89   Regular Pap Smears Yes    History of HPV Infection    Cycle Normal       Additional Hx no other gyn problems in the past   Problem List: Patient Active Problem List   Diagnosis Date Noted  . Left lower quadrant pain 05/22/2017  . Ovarian cyst, complex 05/11/2017  . Leukocytosis 05/19/2016  . Erythrocytosis 05/19/2016  . Neck pain on left side 05/19/2016  . 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 Staphylococcal infection of skin 10/13/2014  . Furuncle of vulva 10/13/2014  . Hyperlipidemia due to type 1 diabetes mellitus ([place] 09/12/2014  . Encounter for preventive health examination 09/12/2014  . Onychomycosis of toenail 09/10/2014  . Type 1 diabetes mellitus with neurological manifestations, uncontrolled ([place] 02/10/2014  . Cough 02/05/2014  . Carpal tunnel  syndrome 12/17/2013  . Chronic bronchitis ([place]) 11/27/2013  . Unspecified constipation 11/11/2013  . Left wrist pain 11/07/2013  . Insomnia secondary to anxiety 07/09/2013  . History of hyperthyroidism 07/01/2013  . Degenerative TFCC tear 05/23/2013  . Hyperthyroidism 04/28/2013  . Right wrist pain 04/28/2013  . Type I diabetes mellitus ([place]) 04/28/2013  . Snoring disorder 12/26/2012  . Tobacco abuse 05/22/2011  . Tobacco abuse counseling 05/22/2011  . ABNORMAL EKG 09/10/2009  . Dyslipidemia 04/02/2006    Past Medical History: Past Medical History:   Diagnosis Date  . Diabetes mellitus   . Hay fever    Allergies  . Heart murmur   . History of HPV infection   . Hypercholesterolemia   . Hypothyroidism   . Melanoma of skin ([place]) 2010   In her vaginal area with surgical resection.   . Myocardial infarction ([place]) 1997   At age 53  . Ovarian cyst 05/09/2017  . Thyroid disease   . Tobacco abuse   . Vulvar lesion     Past Surgical History: Past Surgical History:  Procedure Laterality Date  . ABDOMINAL HYSTERECTOMY  10/12/12  . ANKLE SURGERY Right 2010   Fractued ankle  . BREAST BIOPSY Bilateral @ 2013   core with clips  . FLEXIBLE BRONCHOSCOPY N/A 05/30/2017   Procedure: FLEXIBLE BRONCHOSCOPY;  Surgeon: Laverle Hobby, MD;  Location: [facility] ORS;  Service: Pulmonary;  Laterality: N/A;  . LAPAROSCOPIC SALPINGO OOPHERECTOMY Left 06/14/2017   Procedure: LAPAROSCOPIC SALPINGO OOPHORECTOMY;  Surgeon: Gae Dry, MD;  Location: [facility] ORS;  Service: Gynecology;  Laterality: Left;  . TUBAL LIGATION  2004  . TUBAL LIGATION  2004, [facility]  . VULVAR LESION REMOVAL  10/2009 & 05/2012  . VULVAR LESION REMOVAL N/A 04/07/2015   Procedure: VULVAR LESION;  Surgeon: Gillis Ends, MD;  Location: [facility] ORS;  Service: Gynecology;  Laterality: N/A;  with J-Plasma blade   Family History: Family History  Problem Relation Age of Onset  . Diabetes Father   . Hypertension Mother   . Hyperlipidemia Mother   . Diabetes Mother   . Cancer Other        Aunt-breast; grandmother- lung and ovarian  . Aneurysm Brother   . Breast cancer Neg Hx    Social History: Social History   Socioeconomic History  . Marital status: Married    Spouse name: Not on file  . Number of children: Not on file  . Years of education: Not on file  . Highest education level: Not on file  Occupational History  . Not on file  Social Needs  . Financial resource strain: Not on file  . Food insecurity:    Worry: Not on file    Inability: Not on file  .  Transportation needs:    Medical: Not on file    Non-medical: Not on file  Tobacco Use  . Smoking status: Current Every Day Smoker    Packs/day: 0.25    Years: 22.00    Pack years: 5.50    Types: Cigarettes  . Smokeless tobacco: Never Used  . Tobacco comment: Down to .5ppd, trying to quit.  Allergic to chantix and e-cigs 02/05/14  Substance and Sexual Activity  . Alcohol use: No    Alcohol/week: 0.0 oz  . Drug use: No  . Sexual activity: Yes    Birth control/protection: Surgical  Lifestyle  . Physical activity:    Days per week: Not on file    Minutes per session: Not on file  .  Stress: Not on file  Relationships  . Social connections:    Talks on phone: Not on file    Gets together: Not on file    Attends religious service: Not on file    Active member of club or organization: Not on file    Attends meetings of clubs or organizations: Not on file    Relationship status: Not on file  . Intimate partner violence:    Fear of current or ex partner: Not on file    Emotionally abused: Not on file    Physically abused: Not on file    Forced sexual activity: Not on file  Other Topics Concern  . Not on file  Social History Narrative   No regular exercise.   Lives with spouse.    Allergies: Allergies  Allergen Reactions  . Chantix [Varenicline] Rash  . Septra [Sulfamethoxazole-Trimethoprim] Rash    Current Medications: Current Outpatient Medications  Medication Sig Dispense Refill  . ACCU-CHEK AVIVA PLUS test strip TEST 2 TIMES A DAY 100 each 6  . aspirin EC 81 MG tablet Take 81 mg by mouth daily.    Marland Kitchen atorvastatin (LIPITOR) 20 MG tablet Take 20 mg by mouth at bedtime.    . B-D ULTRAFINE III SHORT PEN 31G X 8 MM MISC USE TWO NEEDLES DAILY 100 each 8  . Blood Glucose Monitoring Suppl (ACCU-CHEK AVIVA PLUS) W/DEVICE KIT 1 Device by Does not apply route once. 1 kit 0  . canagliflozin (INVOKANA) 300 MG TABS tablet Take 300 mg by mouth daily before breakfast.    . clobetasol  ointment (TEMOVATE) 9.52 % Apply 1 application topically every other day. At night 30 g 3  . cyclobenzaprine (FLEXERIL) 10 MG tablet Take 10 mg by mouth 3 (three) times daily as needed for muscle spasms.    . ergocalciferol (VITAMIN D2) 50000 units capsule Take 50,000 Units by mouth every Wednesday.    . estradiol (MINIVELLE) 0.05 MG/24HR patch Place 1 patch (0.05 mg total) 2 (two) times a week onto the skin. 8 patch 12  . insulin aspart (NOVOLOG FLEXPEN) 100 UNIT/ML FlexPen 4 units with breakfast, and 4 units with the evening meal. (Patient taking differently: Inject into the skin 3 (three) times daily with meals. Sliding scale  131-180= 2 units 181-240= 4 units 241-300= 6 untis  301-350= 8 units  >351= 10units) 15 mL 11  . Insulin Degludec (TRESIBA FLEXTOUCH) 200 UNIT/ML SOPN Inject 46 Units into the skin at bedtime.    Marland Kitchen levothyroxine (SYNTHROID, LEVOTHROID) 75 MCG tablet Take 75 mcg by mouth daily before breakfast.     No current facility-administered medications for this visit.    Facility-Administered Medications Ordered in Other Visits  Medication Dose Route Frequency Provider Last Rate Last Dose  . lidocaine (XYLOCAINE) 2 % jelly 1 application  1 application Topical QID PRN Gillis Ends, MD        Review of Systems Pertinent items are noted in HPI.  Objective:  Physical Examination:  LMP 10/12/2012   ECOG Performance Status: 1 - Symptomatic but completely ambulatory  General appearance: alert, cooperative and appears stated age HEENT:PERRLA, neck supple with midline trachea and thyroid without masses Lymph node survey: non-palpable, axillary, inguinal, supraclavicular Cardiovascular: regular rate and rhythm, no murmurs or gallops Respiratory: normal air entry, lungs clear to auscultation Abdomen: soft, non-tender, without masses or organomegaly, no hernias and well healed incision Back: inspection of back is normal Extremities: extremities normal, atraumatic, no  cyanosis or edema Skin exam -  normal coloration and turgor, no rashes, no suspicious skin lesions noted. Neurological exam reveals alert, oriented, normal speech, no focal findings or movement disorder noted.  Pelvic: exam chaperoned by nurse;  Vulva: some white epithelium on both sides anteriorly. Vagina: normal, mild discharge; Adnexa: normal adnexa in size, nontender and no masses; Uterus: absent; Cervix: absent;   Colposcopy showed areas of WE anteriorly on both sides. Looks like LSIL. Biopsy done of left vulva after 1 ml of 2% xylocaine placed. Patient signed consent and time out.  Assessment:  Marcia [name] is a 47 y.o. female with a history of vulvar dysplasia VINIII s/p laser 04/07/15. Vulvar lesion biopsied 7/18 LGSIL (Condyloma) and positive for candidiasis. Unfortunately, she continues to smoke.   Clinically, today has some itching Vulvar biopsy performed at 9:00. Will send diflucan for possible yeast infection.   Plan:   Problem List Items Addressed This Visit      Musculoskeletal and Integument   Vulvar intraepithelial neoplasia (VIN) grade 2    Other Visit Diagnoses    VIN III (vulvar intraepithelial neoplasia III)    -  Primary   Relevant Orders   Surgical pathology       Again, discussed importance of tobacco cessation in controlling VIN. Will treat with Diflucan and see if vulvar biopsy shows any higher grade dysplasia that would warrant laser ablation.    RTC in 6 months.   Beckey Rutter, DNP, AGNP-C [facility] (work cell) [phone] (office) 11/14/17 12:01 PM  CC:  Danelle Berry, NP [address] [place], [place] [address] [phone]   I personally interviewed and examined the patient. Agreed with the above/below plan of care. Patient/family questions were answered.  Mellody Drown, MD

## 2017-11-14 NOTE — Patient Instructions (Signed)

## 2017-11-14 NOTE — Progress Notes (Signed)
Pt still has itching vaginally. After her ovary removed in October she had no itching and then about 3 weeks ago the itching came back. Using cream ordered but it is a short term help and starts back itching before the next dose is due.

## 2017-11-15 ENCOUNTER — Encounter: Payer: Self-pay | Admitting: Obstetrics and Gynecology

## 2017-11-16 ENCOUNTER — Emergency Department
Admission: EM | Admit: 2017-11-16 | Discharge: 2017-11-16 | Disposition: A | Discharge status: Home / Self Care | Payer: BLUE CROSS/BLUE SHIELD | Attending: Emergency Medicine | Admitting: Emergency Medicine

## 2017-11-16 ENCOUNTER — Encounter: Payer: Self-pay | Admitting: Emergency Medicine

## 2017-11-16 DIAGNOSIS — Z5321 Procedure and treatment not carried out due to patient leaving prior to being seen by health care provider: Secondary | ICD-10-CM | POA: Diagnosis not present

## 2017-11-16 DIAGNOSIS — N938 Other specified abnormal uterine and vaginal bleeding: Secondary | ICD-10-CM | POA: Insufficient documentation

## 2017-11-16 LAB — CBC
HEMATOCRIT: 47.7 % — AB (ref 35.0–47.0)
HEMOGLOBIN: 16.4 g/dL — AB (ref 12.0–16.0)
MCH: 31.4 pg (ref 26.0–34.0)
MCHC: 34.3 g/dL (ref 32.0–36.0)
MCV: 91.5 fL (ref 80.0–100.0)
Platelets: 279 10*3/uL (ref 150–440)
RBC: 5.22 MIL/uL — ABNORMAL HIGH (ref 3.80–5.20)
RDW: 13.4 % (ref 11.5–14.5)
WBC: 11.5 10*3/uL — ABNORMAL HIGH (ref 3.6–11.0)

## 2017-11-16 NOTE — ED Notes (Signed)
Patient is not in restrooms or her room. Dr. Corky Downs aware.

## 2017-11-16 NOTE — ED Provider Notes (Signed)
Patient was roomed but apparently left prior to being seen by me   Lavonia Drafts, MD 11/16/17 [phone]

## 2017-11-16 NOTE — ED Triage Notes (Signed)
Pt comes into the ED via POV c/o vaginal bleeding that started 30 minutes ago.  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  Patient had a hysterectomy 4 years ago.  Patient explains that she has changed her pad twice already and it was fully saturated.  Patient in NAD at this time with good color and moist mucous membranes.  Denies any dizziness at this time. Patient is unsure at this time if the bleeding is coming from the biopsy site or internal from the vagina.

## 2017-11-16 NOTE — ED Notes (Signed)
Patient not found in room. Dr. Corky Downs aware.

## 2017-11-16 NOTE — ED Notes (Signed)
Verbal order per Dr. Corky Downs to complete a CBC on patient.

## 2017-11-20 ENCOUNTER — Telehealth: Payer: Self-pay

## 2017-11-20 LAB — SURGICAL PATHOLOGY

## 2017-11-20 NOTE — Telephone Encounter (Signed)
Lauren would you review her pathology and arrange follow up and treatment based on recommendations. Current follow up is 6 months.   Surgical Pathology  CASE: [id number]  PATIENT: Shaquira Cangelosi  Surgical Pathology Report      SPECIMEN SUBMITTED:  A. Vulva, right   CLINICAL HISTORY:  None provided   PRE-OPERATIVE DIAGNOSIS:  VIN III   POST-OPERATIVE DIAGNOSIS:  VIN III      DIAGNOSIS:  A. RIGHT VULVA; BIOPSY:  - HIGH-GRADE SQUAMOUS INTRAEPITHELIAL LESION (VIN III).  - A PERIPHERAL BIOPSY EDGE IS INVOLVED.         Oncology Nurse Navigator Documentation  Navigator Location: [facility] (11/20/17 1600)   )Navigator Encounter Type: Diagnostic Results (11/20/17 1600)                                                    Time Spent with Patient: 15 (11/20/17 1600)

## 2017-11-22 ENCOUNTER — Telehealth: Payer: Self-pay | Admitting: Nurse Practitioner

## 2017-11-22 NOTE — Telephone Encounter (Signed)
Called patient to review results of recent biopsy. No answer. Left message to return call. Per Dr. Fransisca Connors, will plan to have her return to clinic in 3 months for further evaluation.

## 2017-11-26 ENCOUNTER — Ambulatory Visit
Admission: RE | Admit: 2017-11-26 | Discharge: 2017-11-26 | Disposition: A | Discharge status: Home / Self Care | Payer: BLUE CROSS/BLUE SHIELD | Source: Ambulatory Visit | Attending: Nurse Practitioner | Admitting: Nurse Practitioner

## 2017-11-26 DIAGNOSIS — Z1231 Encounter for screening mammogram for malignant neoplasm of breast: Secondary | ICD-10-CM | POA: Insufficient documentation

## 2018-01-25 ENCOUNTER — Telehealth: Payer: Self-pay | Admitting: Internal Medicine

## 2018-01-25 NOTE — Telephone Encounter (Signed)
LMOVM for pt to contact me to arrange scheduling at the patient's desired location.  Unsure if [employer] has spoke to patient about changing locations. Rhonda J Cobb

## 2018-01-25 NOTE — Telephone Encounter (Signed)
[employer] calling to ask for a CT order from Dr. Juanell Fairly be sent to [facility] for patient scheduling.     Called [employer] Scheduling to verify fax information .  [phone] tele  Per imaging department fax to [place] location at [phone]  Please send order so patient can  Be scheduled .

## 2018-01-29 NOTE — Telephone Encounter (Signed)
ATC patient today at 3:06 pm. No answer. Left message for pt to return my call about this message and to schedule CT and ROV. Rhonda J Cobb

## 2018-01-31 NOTE — Telephone Encounter (Signed)
LMOVM for pt to return my call to schedule CT Chest. PA obtained by both insurance which expires on 02/22/18.  Advised patient on VM to please return my call to schedule at [phone]. Rhonda J Cobb

## 2018-02-06 ENCOUNTER — Encounter: Payer: Self-pay | Admitting: Internal Medicine

## 2018-02-06 NOTE — Telephone Encounter (Signed)
ATC to reach patient again to schedule CT Chest and ROV. No answer. LMOVM for pt to return my call to schedule and if she didn't want to schedule to please contact me to advise.  Letter mailed to patient. Marcia [name]

## 2018-02-06 NOTE — Telephone Encounter (Signed)
Letter printed and mailed to patient asking her to contact office. This phone note has been closed to allow pt time to receive letter and respond. Marcia [name]

## 2018-02-20 ENCOUNTER — Ambulatory Visit: Payer: BLUE CROSS/BLUE SHIELD

## 2018-03-20 ENCOUNTER — Inpatient Hospital Stay: Payer: BLUE CROSS/BLUE SHIELD | Attending: Obstetrics and Gynecology | Admitting: Obstetrics and Gynecology

## 2018-03-20 VITALS — BP 129/80 | HR 83 | Temp 97.6°F | Resp 18 | Ht 62.0 in | Wt 158.2 lb

## 2018-03-20 DIAGNOSIS — D071 Carcinoma in situ of vulva: Secondary | ICD-10-CM | POA: Diagnosis present

## 2018-03-20 MED ORDER — CLOBETASOL PROPIONATE 0.05 % EX OINT: 1.0000 "application " | TOPICAL_OINTMENT | CUTANEOUS | 2 refills | Status: DC

## 2018-03-20 NOTE — Progress Notes (Signed)
Gynecologic Oncology Interval Note  Referring Provider: Dr. 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 female, diagnosed with VIN III, who returns to clinic today for continued management.   She was last seen in gyn-onc clinic by Dr. Fransisca Connors on 11/14/17. She complained of some vulvar itching at that time. Vulvar biopsy performed at 9:00 and diflucan was sent for possible yeast infection.   Pathology:  A. RIGHT VULVA; BIOPSY:  - HIGH-GRADE SQUAMOUS INTRAEPITHELIAL LESION (VIN III).  - A PERIPHERAL BIOPSY EDGE IS INVOLVED.   Today, she reports no vulvar symptoms and only uses the clobetasol intermittently.  Stopped smoking 2 weeks ago.   Treatment History:  Prior hysterectomy   08/2009     VIN II                 laser vaporization 03/2010     VIN II                 Aldara started,well tolerated with reduced frequency, 04/2011     infection peri-clitoral area, responding to antibiotic therapy 06/2012   recurrent VIN III on biopsy of left labia minora, PAP negative. 1/14     laser vaporization of raised epithelium consistent with VIN, mainly on the anterior portion of the labia minora. A few other scattered areas around the external genitalia.  04/07/15:  Had laser ablation in OR with Dr Theora Gianotti.  "Acetowhite epithelium involving bilateral labia minora of the vulva each approximately 3 cm. On the left the abnormalities extending to the medial aspect of the labia minora. There was a another patch of acetowhite on right vulva at 3 o'clock as well as another area at the perineum. There were scattered small 1-2 mm areas of acetowhite extending toward the anus."  02/14/17   Vulvar biopsies, right VIN2 grossly removed, left keratosis. DIAGNOSIS: VULVA, RIGHT ANTERIOR; BIOPSY:  - KERATOSIS AND MILD SQUAMOUS DYSPLASIA.  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  - A PERIPHERAL EDGE IS INVOLVED.   Seen in clinic  on 04/11/17.  At that time, she had a new 58m wart near introitus at 7:00 which was removed.  Wet prep positive for yeast.  Negative for trichomoniasis and clue cells.    Pathology  04/11/2017:  DIAGNOSIS:  A. VULVA, RIGHT; BIOPSY/REMOVAL:  - LOW-GRADE SQUAMOUS INTRAEPITHELIAL LESION (CONDYLOMA).  - CANDIDIASIS.  - NEGATIVE FOR HIGH-GRADE SQUAMOUS INTRAEPITHELIAL LESION AND MALIGNANCY.  Comment: There are focal intraepithelial neutrophils, and PAS stain for fungi demonstrates a few intraepithelial pseudohyphae.   She was treated with Diflucan 150 mg daily X 3 days and used clobetasol topically for persistent symptoms.   She had her left ovary removed 05/2017 with Dr. HKenton Kingfisherd/t cyst and pain.    GYN History:   Gravida 3    Para 2    Age at Menarche 89   Regular Pap Smears Yes    History of HPV Infection    Cycle Normal       Additional Hx no other gyn problems in the past   Problem List: Patient Active Problem List   Diagnosis Date Noted  . Left lower quadrant pain 05/22/2017  . Ovarian cyst, complex 05/11/2017  . Leukocytosis 05/19/2016  . Erythrocytosis 05/19/2016  . Neck pain on left side 05/19/2016  . Vulvar intraepithelial neoplasia (VIN) grade 3 03/10/2015  . 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 Staphylococcal infection of skin 10/13/2014  .  Furuncle of vulva 10/13/2014  . Hyperlipidemia due to type 1 diabetes mellitus ([place]) 09/12/2014  . Encounter for preventive health examination 09/12/2014  . Onychomycosis of toenail 09/10/2014  . Type 1 diabetes mellitus with neurological manifestations, uncontrolled ([place]) 02/10/2014  . Cough 02/05/2014  . Carpal tunnel syndrome 12/17/2013  . Chronic bronchitis ([place]) 11/27/2013  . Unspecified constipation 11/11/2013  . Left wrist pain 11/07/2013  . Insomnia secondary to anxiety 07/09/2013  . History of hyperthyroidism 07/01/2013  . Degenerative TFCC tear 05/23/2013  . Hyperthyroidism 04/28/2013  . Right wrist pain  04/28/2013  . Type I diabetes mellitus ([place]) 04/28/2013  . Snoring disorder 12/26/2012  . Tobacco abuse 05/22/2011  . Tobacco abuse counseling 05/22/2011  . ABNORMAL EKG 09/10/2009  . Dyslipidemia 04/02/2006    Past Medical History: Past Medical History:  Diagnosis Date  . Diabetes mellitus   . Hay fever    Allergies  . Heart murmur   . History of HPV infection   . Hypercholesterolemia   . Hypothyroidism   . Melanoma of skin ([place]) 2010   In her vaginal area with surgical resection.   . Myocardial infarction ([place]) 1997   At age 7  . Ovarian cyst 05/09/2017  . Thyroid disease   . Tobacco abuse   . Vulvar lesion     Past Surgical History: Past Surgical History:  Procedure Laterality Date  . ABDOMINAL HYSTERECTOMY  10/12/12  . ANKLE SURGERY Right 2010   Fractued ankle  . BREAST BIOPSY Bilateral @ 2013   core with clips  . FLEXIBLE BRONCHOSCOPY N/A 05/30/2017   Procedure: FLEXIBLE BRONCHOSCOPY;  Surgeon: Laverle Hobby, MD;  Location: [facility] ORS;  Service: Pulmonary;  Laterality: N/A;  . LAPAROSCOPIC SALPINGO OOPHERECTOMY Left 06/14/2017   Procedure: LAPAROSCOPIC SALPINGO OOPHORECTOMY;  Surgeon: Gae Dry, MD;  Location: [facility] ORS;  Service: Gynecology;  Laterality: Left;  . TUBAL LIGATION  2004  . TUBAL LIGATION  2004, [facility]  . VULVAR LESION REMOVAL  10/2009 & 05/2012  . VULVAR LESION REMOVAL N/A 04/07/2015   Procedure: VULVAR LESION;  Surgeon: Gillis Ends, MD;  Location: [facility] ORS;  Service: Gynecology;  Laterality: N/A;  with J-Plasma blade   Family History: Family History  Problem Relation Age of Onset  . Diabetes Father   . Hypertension Mother   . Hyperlipidemia Mother   . Diabetes Mother   . Aneurysm Brother   . Breast cancer Maternal Aunt 40       lung and ovarian   Social History: Social History   Socioeconomic History  . Marital status: Married    Spouse name: Not on file  . Number of children: Not on file  . Years of education: Not  on file  . Highest education level: Not on file  Occupational History  . Not on file  Social Needs  . Financial resource strain: Not on file  . Food insecurity:    Worry: Not on file    Inability: Not on file  . Transportation needs:    Medical: Not on file    Non-medical: Not on file  Tobacco Use  . Smoking status: Current Every Day Smoker    Packs/day: 0.25    Years: 22.00    Pack years: 5.50    Types: Cigarettes  . Smokeless tobacco: Never Used  . Tobacco comment: Down to .5ppd, trying to quit.  Allergic to chantix and e-cigs 02/05/14  Substance and Sexual Activity  . Alcohol use: No    Alcohol/week: 0.0  standard drinks  . Drug use: No  . Sexual activity: Yes    Birth control/protection: Surgical  Lifestyle  . Physical activity:    Days per week: Not on file    Minutes per session: Not on file  . Stress: Not on file  Relationships  . Social connections:    Talks on phone: Not on file    Gets together: Not on file    Attends religious service: Not on file    Active member of club or organization: Not on file    Attends meetings of clubs or organizations: Not on file    Relationship status: Not on file  . Intimate partner violence:    Fear of current or ex partner: Not on file    Emotionally abused: Not on file    Physically abused: Not on file    Forced sexual activity: Not on file  Other Topics Concern  . Not on file  Social History Narrative   No regular exercise.   Lives with spouse.    Allergies: Allergies  Allergen Reactions  . Chantix [Varenicline] Rash  . Septra [Sulfamethoxazole-Trimethoprim] Rash    Current Medications: Current Outpatient Medications  Medication Sig Dispense Refill  . ACCU-CHEK AVIVA PLUS test strip TEST 2 TIMES A DAY 100 each 6  . aspirin EC 81 MG tablet Take 81 mg by mouth daily.    Marland Kitchen atorvastatin (LIPITOR) 20 MG tablet Take 20 mg by mouth at bedtime.    . B-D ULTRAFINE III SHORT PEN 31G X 8 MM MISC USE TWO NEEDLES DAILY 100  each 8  . Blood Glucose Monitoring Suppl (ACCU-CHEK AVIVA PLUS) W/DEVICE KIT 1 Device by Does not apply route once. 1 kit 0  . canagliflozin (INVOKANA) 300 MG TABS tablet Take 300 mg by mouth daily before breakfast.    . clobetasol ointment (TEMOVATE) 2.87 % Apply 1 application topically every other day. At night 30 g 3  . cyclobenzaprine (FLEXERIL) 10 MG tablet Take 10 mg by mouth 3 (three) times daily as needed for muscle spasms.    . ergocalciferol (VITAMIN D2) 50000 units capsule Take 50,000 Units by mouth every Wednesday.    . estradiol (MINIVELLE) 0.05 MG/24HR patch Place 1 patch (0.05 mg total) 2 (two) times a week onto the skin. 8 patch 12  . gabapentin (NEURONTIN) 100 MG capsule Take 100 mg by mouth at bedtime.    . insulin aspart (NOVOLOG FLEXPEN) 100 UNIT/ML FlexPen 4 units with breakfast, and 4 units with the evening meal. (Patient taking differently: Inject into the skin 3 (three) times daily with meals. Sliding scale  131-180= 2 units 181-240= 4 units 241-300= 6 untis  301-350= 8 units  >351= 10units) 15 mL 11  . Insulin Degludec (TRESIBA FLEXTOUCH) 200 UNIT/ML SOPN Inject 46 Units into the skin at bedtime.    Marland Kitchen levothyroxine (SYNTHROID, LEVOTHROID) 75 MCG tablet Take 75 mcg by mouth daily before breakfast.    . PARoxetine Mesylate (BRISDELLE) 7.5 MG CAPS Take 1 capsule by mouth daily before breakfast.     No current facility-administered medications for this visit.    Facility-Administered Medications Ordered in Other Visits  Medication Dose Route Frequency Provider Last Rate Last Dose  . lidocaine (XYLOCAINE) 2 % jelly 1 application  1 application Topical QID PRN Gillis Ends, MD        Review of Systems General:  no complaints Skin: no complaints Eyes: no complaints HEENT: no complaints Breasts: no complaints Pulmonary: no complaints Cardiac:  no complaints Gastrointestinal: no complaints Genitourinary/Sexual: no complaints Ob/Gyn: no  complaints Musculoskeletal: no complaints Hematology: no complaints Neurologic/Psych: no complaints  Objective:   Physical Examination:  BP 129/80   Pulse 83   Temp 97.6 F (36.4 C) (Tympanic)   Resp 18   Ht _0  (1.575 m)   Wt 158 lb 3.2 oz (71.8 kg)   LMP 10/12/2012   BMI 28.94 kg/m   ECOG Performance Status: 1 - Symptomatic but completely ambulatory  General appearance: alert, cooperative and appears stated age HEENT:PERRLA, neck supple with midline trachea and thyroid without masses Lymph node survey: non-palpable, axillary, inguinal, supraclavicular Cardiovascular: regular rate and rhythm, no murmurs or gallops Respiratory: normal air entry, lungs clear to auscultation Abdomen: soft, non-tender, without masses or organomegaly, no hernias and well healed incision Back: inspection of back is normal Extremities: extremities normal, atraumatic, no cyanosis or edema Skin exam - normal coloration and turgor, no rashes, no suspicious skin lesions noted. Neurological exam reveals alert, oriented, normal speech, no focal findings or movement disorder noted.  Pelvic: exam chaperoned by nurse;  Vulva: some white epithelium on both sides anteriorly. Vagina: normal, mild discharge; Adnexa: normal adnexa in size, nontender and no masses; Uterus: absent; Cervix: absent;   Colposcopy showed areas of WE anteriorly on both sides. Looks like LSIL. Biopsy done of left vulva after 1 ml of 2% xylocaine placed. Patient signed consent and time out.  Assessment:  Marcia [name] is a 47 y.o. female with a history of vulvar dysplasia VINIII s/p laser 04/07/15. Vulvar lesion biopsied 7/18 LGSIL.  Vulvar lesion biopsied off 4/19 VINIII and positive margin but no evidence of VIN on exam today. Fortunately, she stopped smoking two weeks ago.   Plan:   Problem List Items Addressed This Visit      Other   Vulvar intraepithelial neoplasia (VIN) grade 3 - Primary       Again, discussed importance  of tobacco cessation in controlling VIN and she stopped 2 weeks ago. No evidence of VIN on exam today, so no further therapy planned.   RTC in 6 months.   Mellody Drown, MD  CC:  Danelle Berry, NP [address] [place],  [phone]   I personally interviewed and examined the patient. Agreed with the above/below plan of care. Patient/family questions were answered.  Mellody Drown, MD

## 2018-03-20 NOTE — Progress Notes (Signed)
Itching at times but not every day and cream that was prescribed helps. Her last day she was itching was Monday of this week

## 2018-04-10 ENCOUNTER — Other Ambulatory Visit: Payer: Self-pay | Admitting: Orthopedic Surgery

## 2018-04-12 NOTE — Pre-Procedure Instructions (Signed)
CHARDAY CAPETILLO  04/12/2018      [facility] [address] ([address]), [place] [address] [place] ([place]) [place] [address] Phone: [phone] Fax: [phone]    Your procedure is scheduled on Thursday September 12th.  Report to [facility] Admitting at [place].M.  Call this number if you have problems the morning of surgery:  [phone]   Remember:  Do not eat or drink after midnight.    Take these medicines the morning of surgery with A SIP OF WATER   Gabapentin  Synthroid  Paroxetine   7 days prior to surgery STOP taking any Aspirin(unless otherwise instructed by your surgeon), Aleve, Naproxen, Ibuprofen, Motrin, Advil, Goody's, BC's, all herbal medications, fish oil, and all vitamins    WHAT DO I DO ABOUT MY DIABETES MEDICATION?   Marland Kitchen Do not take oral diabetes medicines (pills) the morning of surgery. Invokana  . THE NIGHT BEFORE SURGERY, take 2 units of Tresiba insulin.      . The day of surgery, do not take other diabetes injectables, including Byetta (exenatide), Bydureon (exenatide ER), Victoza (liraglutide), or Trulicity (dulaglutide).  . If your CBG is greater than 220 mg/dL, you may take  of your sliding scale (correction) dose of insulin.   How to Manage Your Diabetes Before and After Surgery  Why is it important to control my blood sugar before and after surgery? . Improving blood sugar levels before and after surgery helps healing and can limit problems. . A way of improving blood sugar control is eating a healthy diet by: o  Eating less sugar and carbohydrates o  Increasing activity/exercise o  Talking with your doctor about reaching your blood sugar goals . High blood sugars (greater than 180 mg/dL) can raise your risk of infections and slow your recovery, so you will need to focus on controlling your diabetes during the weeks before surgery. . Make sure that the doctor who takes care of your  diabetes knows about your planned surgery including the date and location.  How do I manage my blood sugar before surgery? . Check your blood sugar at least 4 times a day, starting 2 days before surgery, to make sure that the level is not too high or low. o Check your blood sugar the morning of your surgery when you wake up and every 2 hours until you get to the Short Stay unit. . If your blood sugar is less than 70 mg/dL, you will need to treat for low blood sugar: o Do not take insulin. o Treat a low blood sugar (less than 70 mg/dL) with  cup of clear juice (cranberry or apple), 4 glucose tablets, OR glucose gel. o Recheck blood sugar in 15 minutes after treatment (to make sure it is greater than 70 mg/dL). If your blood sugar is not greater than 70 mg/dL on recheck, call [phone] for further instructions. . Report your blood sugar to the short stay nurse when you get to Short Stay.  . If you are admitted to the hospital after surgery: o Your blood sugar will be checked by the staff and you will probably be given insulin after surgery (instead of oral diabetes medicines) to make sure you have good blood sugar levels. o The goal for blood sugar control after surgery is 80-180 mg/dL.     Do not wear jewelry, make-up or nail polish.  Do not wear lotions, powders, or perfumes, or deodorant.  Do  not shave 48 hours prior to surgery.  Men may shave face and neck.  Do not bring valuables to the hospital.  [facility] is not responsible for any belongings or valuables.  Contacts, dentures or bridgework may not be worn into surgery.  Leave your suitcase in the car.  After surgery it may be brought to your room.  For patients admitted to the hospital, discharge time will be determined by your treatment team.  Patients discharged the day of surgery will not be allowed to drive home.    [facility]- Preparing For Surgery  Before surgery, you can play an important role. Because skin is not  sterile, your skin needs to be as free of germs as possible. You can reduce the number of germs on your skin by washing with CHG (chlorahexidine gluconate) Soap before surgery.  CHG is an antiseptic cleaner which kills germs and bonds with the skin to continue killing germs even after washing.    Oral Hygiene is also important to reduce your risk of infection.  Remember - BRUSH YOUR TEETH THE MORNING OF SURGERY WITH YOUR REGULAR TOOTHPASTE  Please do not use if you have an allergy to CHG or antibacterial soaps. If your skin becomes reddened/irritated stop using the CHG.  Do not shave (including legs and underarms) for at least 48 hours prior to first CHG shower. It is OK to shave your face.  Please follow these instructions carefully.   1. Shower the NIGHT BEFORE SURGERY and the MORNING OF SURGERY with CHG.   2. If you chose to wash your hair, wash your hair first as usual with your normal shampoo.  3. After you shampoo, rinse your hair and body thoroughly to remove the shampoo.  4. Use CHG as you would any other liquid soap. You can apply CHG directly to the skin and wash gently with a scrungie or a clean washcloth.   5. Apply the CHG Soap to your body ONLY FROM THE NECK DOWN.  Do not use on open wounds or open sores. Avoid contact with your eyes, ears, mouth and genitals (private parts). Wash Face and genitals (private parts)  with your normal soap.  6. Wash thoroughly, paying special attention to the area where your surgery will be performed.  7. Thoroughly rinse your body with warm water from the neck down.  8. DO NOT shower/wash with your normal soap after using and rinsing off the CHG Soap.  9. Pat yourself dry with a CLEAN TOWEL.  10. Wear CLEAN PAJAMAS to bed the night before surgery, wear comfortable clothes the morning of surgery  11. Place CLEAN SHEETS on your bed the night of your first shower and DO NOT SLEEP WITH PETS.    Day of Surgery:  Do not apply any  deodorants/lotions.  Please wear clean clothes to the hospital/surgery center.   Remember to brush your teeth WITH YOUR REGULAR TOOTHPASTE.    Please read over the following fact sheets that you were given. Coughing and Deep Breathing, MRSA Information and Surgical Site Infection Prevention

## 2018-04-15 ENCOUNTER — Encounter (HOSPITAL_COMMUNITY): Payer: Self-pay

## 2018-04-15 ENCOUNTER — Encounter (HOSPITAL_COMMUNITY)
Admission: RE | Admit: 2018-04-15 | Discharge: 2018-04-15 | Disposition: A | Discharge status: Home / Self Care | Payer: BLUE CROSS/BLUE SHIELD | Source: Ambulatory Visit | Attending: Orthopedic Surgery | Admitting: Orthopedic Surgery

## 2018-04-15 ENCOUNTER — Other Ambulatory Visit: Payer: Self-pay

## 2018-04-15 DIAGNOSIS — Z79899 Other long term (current) drug therapy: Secondary | ICD-10-CM | POA: Insufficient documentation

## 2018-04-15 DIAGNOSIS — D071 Carcinoma in situ of vulva: Secondary | ICD-10-CM | POA: Insufficient documentation

## 2018-04-15 DIAGNOSIS — E109 Type 1 diabetes mellitus without complications: Secondary | ICD-10-CM | POA: Insufficient documentation

## 2018-04-15 DIAGNOSIS — E785 Hyperlipidemia, unspecified: Secondary | ICD-10-CM | POA: Insufficient documentation

## 2018-04-15 DIAGNOSIS — Z794 Long term (current) use of insulin: Secondary | ICD-10-CM | POA: Diagnosis not present

## 2018-04-15 DIAGNOSIS — Z01812 Encounter for preprocedural laboratory examination: Secondary | ICD-10-CM | POA: Insufficient documentation

## 2018-04-15 HISTORY — DX: Presence of spectacles and contact lenses: Z97.3

## 2018-04-15 HISTORY — DX: Radiculopathy, cervical region: M54.12

## 2018-04-15 HISTORY — DX: Headache, unspecified: R51.9

## 2018-04-15 HISTORY — DX: Headache: R51

## 2018-04-15 LAB — COMPREHENSIVE METABOLIC PANEL
ALBUMIN: 4 g/dL (ref 3.5–5.0)
ALK PHOS: 66 U/L (ref 38–126)
ALT: 25 U/L (ref 0–44)
AST: 28 U/L (ref 15–41)
Anion gap: 11 (ref 5–15)
BUN: 15 mg/dL (ref 6–20)
CO2: 25 mmol/L (ref 22–32)
Calcium: 9.1 mg/dL (ref 8.9–10.3)
Chloride: 105 mmol/L (ref 98–111)
Creatinine, Ser: 0.84 mg/dL (ref 0.44–1.00)
GFR calc Af Amer: >60 mL/min (ref >60)
GFR calc non Af Amer: >60 mL/min (ref >60)
GLUCOSE: 69 mg/dL — AB (ref 70–99)
POTASSIUM: 3.9 mmol/L (ref 3.5–5.1)
SODIUM: 141 mmol/L (ref 135–145)
Total Bilirubin: 0.6 mg/dL (ref 0.3–1.2)
Total Protein: 7.4 g/dL (ref 6.5–8.1)

## 2018-04-15 LAB — CBC WITH DIFFERENTIAL/PLATELET
Abs Immature Granulocytes: 0 10*3/uL (ref 0.0–0.1)
BASOS ABS: 0.1 10*3/uL (ref 0.0–0.1)
Basophils Relative: 1 %
EOS ABS: 0.1 10*3/uL (ref 0.0–0.7)
EOS PCT: 1 %
HEMATOCRIT: 49.3 % — AB (ref 36.0–46.0)
HEMOGLOBIN: 16.1 g/dL — AB (ref 12.0–15.0)
IMMATURE GRANULOCYTES: 0 %
Lymphocytes Relative: 22 %
Lymphs Abs: 2.8 10*3/uL (ref 0.7–4.0)
MCH: 30.8 pg (ref 26.0–34.0)
MCHC: 32.7 g/dL (ref 30.0–36.0)
MCV: 94.3 fL (ref 78.0–100.0)
MONO ABS: 0.8 10*3/uL (ref 0.1–1.0)
Monocytes Relative: 6 %
NEUTROS ABS: 9.2 10*3/uL — AB (ref 1.7–7.7)
Neutrophils Relative %: 70 %
Platelets: 275 10*3/uL (ref 150–400)
RBC: 5.23 MIL/uL — ABNORMAL HIGH (ref 3.87–5.11)
RDW: 13.8 % (ref 11.5–15.5)
WBC: 12.9 10*3/uL — ABNORMAL HIGH (ref 4.0–10.5)

## 2018-04-15 LAB — TYPE AND SCREEN
ABO/RH(D): O POS
ANTIBODY SCREEN: NEGATIVE

## 2018-04-15 LAB — URINALYSIS, ROUTINE W REFLEX MICROSCOPIC
BILIRUBIN URINE: NEGATIVE
Bacteria, UA: NONE SEEN
HGB URINE DIPSTICK: NEGATIVE
KETONES UR: NEGATIVE mg/dL
Leukocytes, UA: NEGATIVE
NITRITE: NEGATIVE
PH: 6 (ref 5.0–8.0)
Protein, ur: NEGATIVE mg/dL
Specific Gravity, Urine: 1.011 (ref 1.005–1.030)

## 2018-04-15 LAB — SURGICAL PCR SCREEN
MRSA, PCR: NEGATIVE
STAPHYLOCOCCUS AUREUS: NEGATIVE

## 2018-04-15 LAB — APTT: aPTT: 39 seconds — ABNORMAL HIGH (ref 24–36)

## 2018-04-15 LAB — PROTIME-INR
INR: 0.97
Prothrombin Time: 12.8 seconds (ref 11.4–15.2)

## 2018-04-15 LAB — GLUCOSE, CAPILLARY: Glucose-Capillary: 83 mg/dL (ref 70–99)

## 2018-04-15 LAB — ABO/RH: ABO/RH(D): O POS

## 2018-04-15 NOTE — Progress Notes (Signed)
Pt denies SOB and chest pain. Pt under the care of Dr. Humphrey Rolls, Cardiology at [facility] in [place]. Pt denies having a cardiac cath but stated that both an echo and stress test were performed. Pt stated that her A1c was 7.2 on 03/22/18 at [facility] ; requested both cardiac records and labs. Pt denies having a chest x ray within the last year. Pt denies labs since August.  Pt chart forwarded to anesthesia to review cardiac history and abnormal labs.

## 2018-04-15 NOTE — Pre-Procedure Instructions (Signed)
Marcia [name]  04/15/2018      [facility] [address]), [place] [address] [place]) [place] [address] Phone: [phone] Fax: [phone]    Your procedure is scheduled on Thursday, April 18, 2018  Report to [facility] Admitting at 5:30 A.M.  Call this number if you have problems the morning of surgery:  [phone]   Remember:  Do not eat or drink after midnight Wednesday, April 17, 2018    Take these medicines the morning of surgery with A SIP OF WATER   Gabapentin  Synthroid  Paroxetine   7 days prior to surgery STOP taking any Aspirin(unless otherwise instructed by your surgeon), Aleve, Naproxen, Ibuprofen, Motrin, Advil, Goody's, BC's, all herbal medications, fish oil, and all vitamins; stop now    WHAT DO I DO ABOUT MY DIABETES MEDICATION?   Marland Kitchen Do not take oral diabetes medicines (pills) the morning of surgery. Invokana  . THE NIGHT BEFORE SURGERY, take 23 units of Tresiba insulin.      . The day of surgery, do not take other diabetes injectables, including Byetta (exenatide), Bydureon (exenatide ER), Victoza (liraglutide), or Trulicity (dulaglutide).  . If your CBG is greater than 220 mg/dL, you may take  of your sliding scale (correction) dose of insulin.   How to Manage Your Diabetes Before and After Surgery  Why is it important to control my blood sugar before and after surgery? . Improving blood sugar levels before and after surgery helps healing and can limit problems. . A way of improving blood sugar control is eating a healthy diet by: o  Eating less sugar and carbohydrates o  Increasing activity/exercise o  Talking with your doctor about reaching your blood sugar goals . High blood sugars (greater than 180 mg/dL) can raise your risk of infections and slow your recovery, so you will need to focus on controlling your diabetes during the weeks before surgery. . Make sure  that the doctor who takes care of your diabetes knows about your planned surgery including the date and location.  How do I manage my blood sugar before surgery? . Check your blood sugar at least 4 times a day, starting 2 days before surgery, to make sure that the level is not too high or low. o Check your blood sugar the morning of your surgery when you wake up and every 2 hours until you get to the Short Stay unit. . If your blood sugar is less than 70 mg/dL, you will need to treat for low blood sugar: o Do not take insulin. o Treat a low blood sugar (less than 70 mg/dL) with  cup of clear juice (cranberry or apple), 4 glucose tablets, OR glucose gel. o Recheck blood sugar in 15 minutes after treatment (to make sure it is greater than 70 mg/dL). If your blood sugar is not greater than 70 mg/dL on recheck, call [phone] for further instructions. . Report your blood sugar to the short stay nurse when you get to Short Stay.  . If you are admitted to the hospital after surgery: o Your blood sugar will be checked by the staff and you will probably be given insulin after surgery (instead of oral diabetes medicines) to make sure you have good blood sugar levels. o The goal for blood sugar control after surgery is 80-180 mg/dL.     Do not wear jewelry, make-up or nail polish.  Do not wear lotions,  powders, or perfumes, or deodorant.  Do not shave 48 hours prior to surgery.  Men may shave face and neck.  Do not bring valuables to the hospital.  [facility] is not responsible for any belongings or valuables.  Contacts, dentures or bridgework may not be worn into surgery.  Leave your suitcase in the car.  After surgery it may be brought to your room.  Patients discharged the day of surgery will not be allowed to drive home.    [place]- Preparing For Surgery  Before surgery, you can play an important role. Because skin is not sterile, your skin needs to be as free of germs as possible.  You can reduce the number of germs on your skin by washing with CHG (chlorahexidine gluconate) Soap before surgery.  CHG is an antiseptic cleaner which kills germs and bonds with the skin to continue killing germs even after washing.    Oral Hygiene is also important to reduce your risk of infection.  Remember - BRUSH YOUR TEETH THE MORNING OF SURGERY WITH YOUR REGULAR TOOTHPASTE  Please do not use if you have an allergy to CHG or antibacterial soaps. If your skin becomes reddened/irritated stop using the CHG.  Do not shave (including legs and underarms) for at least 48 hours prior to first CHG shower. It is OK to shave your face.  Please follow these instructions carefully.   1. Shower the NIGHT BEFORE SURGERY and the MORNING OF SURGERY with CHG.   2. If you chose to wash your hair, wash your hair first as usual with your normal shampoo.  3. After you shampoo, rinse your hair and body thoroughly to remove the shampoo.  4. Use CHG as you would any other liquid soap. You can apply CHG directly to the skin and wash gently with a scrungie or a clean washcloth.   5. Apply the CHG Soap to your body ONLY FROM THE NECK DOWN.  Do not use on open wounds or open sores. Avoid contact with your eyes, ears, mouth and genitals (private parts). Wash Face and genitals (private parts)  with your normal soap.  6. Wash thoroughly, paying special attention to the area where your surgery will be performed.  7. Thoroughly rinse your body with warm water from the neck down.  8. DO NOT shower/wash with your normal soap after using and rinsing off the CHG Soap.  9. Pat yourself dry with a CLEAN TOWEL.  10. Wear CLEAN PAJAMAS to bed the night before surgery, wear comfortable clothes the morning of surgery  11. Place CLEAN SHEETS on your bed the night of your first shower and DO NOT SLEEP WITH PETS.    Day of Surgery:  Do not apply any deodorants/lotions.  Please wear clean clothes to the hospital/surgery  center.   Remember to brush your teeth WITH YOUR REGULAR TOOTHPASTE.    Please read over the following fact sheets that you were given. Coughing and Deep Breathing, MRSA Information and Surgical Site Infection Prevention

## 2018-04-15 NOTE — Progress Notes (Signed)
Pt made aware to stop taking Celebrex now. Pt verbalized understanding.

## 2018-04-16 NOTE — Progress Notes (Signed)
Anesthesia Chart Review:  Case:  [id number] Date/Time:  04/18/18 0715   Procedure:  ANTERIOR CERVICAL DECOMPRESSION FUSION. CERVICAL 4-5, CERVICAL 5-6, CERVICAL 6-7 WITH INSTRUMENTATION AND ALLOGRAFT. (N/A )   Anesthesia type:  General   Pre-op diagnosis:  BILATERAL CERVICAL RADICULOPATHY   Location:  [facility] OR ROOM 05 / [place] [facility]   Surgeon:  Phylliss Bob, MD      DISCUSSION: Patient is a 47 year old female scheduled for the above procedure.   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 S/p laparoscopic left salpingo oophorectomy 06/14/17.  I called and spoke with patient regarding reported cardiac history. She reported that in 1997 she was hospitalized at [facility] with a "mild" heart attack believed caused by stress from her divorce. She denied CHF or arrhythmia. When asked if heart attack was seen on EKG or labs, she replied, "On both." However, she denied having a cardiac cath, but said she was referred to a cardiologist whom she saw for a few times and was then discharged. She got re-established with cardiology in 2011 and later in 2016, both for preoperative cardiology evaluations for abnormal EKG. She reports that she continues to see cardiologist Dr. Neoma Laming every 6-12 months with last visit 09/17/17 and next visit due 09/2018 with routine echo.   She denied chest pain, SOB, edema. She reports she remains active with her 3 dogs and 6 grandchildren. She says she can walk at least a mile. She does have a chronic cough which has been evaluated by pulmonology, GI and ENT (see below). She denied any known anesthesia complications.   Reported being instructed to hold ASA for 7 days prior to surgery.  I do not have hospital records that further clarify reported MI history from 1997, and currently available cardiology records do not provide further insight either. She denied cardiac cath, however.  Since then she has seen at least two different cardiologist and reports on-going cardiology follow-up since 2016--last visit within the past year. (Per [place] cardiology notes, follow-up has been for known abnormal EKG and HTN, and does not list history of MI or CAD.) She denied any CV symptoms and reported METS > 4. She tolerated surgery within the past year. If no acute changes then I anticipate that she can proceed as planned.   VS: BP 131/72   Pulse 77   Temp 36.8 C (Oral)   Ht _0  (1.575 m)   Wt 72.7 kg   LMP 10/12/2012   SpO2 98%   BMI 29.32 kg/m   PROVIDERS: Danelle Berry, NP is PCP at [facility]). Last visit 03/22/18.  Roslyn Smiling, MD is cardiologist at [facility] She was last seen for yearly follow-up on 09/17/17 by Jake Bathe, NP-C. Patient "completely asymptomatic" and cardiac status felt stable. One year follow-up planned with routine echo at that time to reassess mild MR that was seen in 2017 echo. (She previously saw Ida Rogue, MD once on 09/10/09 for a preoperative evaluation and no additional testing ordered.)  - Wallene Huh, MD is pulmonologist [facility]; [place]). See on 11/13/16 for chronic cough. Bronchoscopy results as below. Cori Razor, MD is ENT ([place]). Last 02/11/18. Negative MRI of the neck/face 02/09/18. Chronic cough felt likely neurogenic cough. ST recommended.     LABS: Labs reviewed: Acceptable for surgery.  03/18/18 A1c 7.2% (AMA).  (all labs ordered  are listed, but only abnormal results are displayed)  Labs Reviewed  APTT - Abnormal; Notable for the following components:      Result Value   aPTT 39 (*)    All other components within normal limits  CBC WITH DIFFERENTIAL/PLATELET - Abnormal; Notable for the following components:   WBC 12.9 (*)    RBC 5.23 (*)    Hemoglobin 16.1 (*)    HCT 49.3 (*)    Neutro Abs 9.2 (*)    All other  components within normal limits  COMPREHENSIVE METABOLIC PANEL - Abnormal; Notable for the following components:   Glucose, Bld 69 (*)    All other components within normal limits  URINALYSIS, ROUTINE W REFLEX MICROSCOPIC - Abnormal; Notable for the following components:   Glucose, UA >=500 (*)    All other components within normal limits  SURGICAL PCR SCREEN  GLUCOSE, CAPILLARY  PROTIME-INR  TYPE AND SCREEN  ABO/RH    IMAGES/RESPIRATORY: Flexible Fiberoptic Laryngoscopy 01/11/18:  Findings: Post cricoid edema and interarytenoid edema; otherwise normal examination.   PH probe and manometry are negative.  Bronchoscopy 05/30/17 (for chronic cough, small RLL lung nodules 05/14/17 CT): FINDINGS/SUMMARY:  -Findings of chronic bronchitis with moderate mucosal edema erythema and moderate secretions throughout both airways. -No endobronchial lesions found. -BAL and transbronchial brushings performed of the right middle lobe. -BAL and transbronchial brushings performed of the lingula.  CT Chest 05/14/17: IMPRESSION: - No acute cardiopulmonary disease. No explanation for the patient's chronic cough. - Small nodules in the right lower lobe. Non-contrast chest CT at 3-6 months is recommended. If the nodules are stable at time of repeat CT, then future CT at 18-24 months (from today's scan) is considered optional for low-risk patients, but is recommended for high-risk patients. This recommendation follows the consensus statement: Guidelines for Management of Incidental Pulmonary Nodules Detected on CT Images: From the Fleischner Society 2017; Radiology 2017; 284:228-243. - Early aortic atherosclerosis.  Spirometry 04/12/17: Normal.   EKG: 06/12/17: NSR, anteroseptal infarct (cited on or before 09/07/09).    CV: Echo 03/14/16 (AMA):  Assessment: Suboptimal study due to poor windows. Mildly dilated left atrium with left ventricle, right atrium and ventricle, and aorta appear normal in  size. Normal LV systolic and diastolic functions. LVEF 62%. Normal wall motion. Mild left ventricular hypertrophy. Mild tricuspid regurgitation. Normal pulmonary artery pressure. Mild mitral regurgitation. No pericardial effusion.  Nuclear stress test 03/18/15 (AMA): Impression: Normal study with normal LVEF.  EF 83%, no perfusion defects.   Past Medical History:  Diagnosis Date  . Diabetes mellitus   . Hay fever    Allergies  . Headache    only from neck pain  . Heart murmur    at birth   . History of HPV infection   . Hypercholesterolemia   . Hypothyroidism   . Melanoma of skin ([place]) 2010   In her vaginal area with surgical resection.   . Myocardial infarction ([place]) 1997   At age 31  . Ovarian cyst 05/09/2017  . Radiculopathy of cervical region    B/L  . Thyroid disease   . Tobacco abuse   . Vulvar lesion   . Wears glasses     Past Surgical History:  Procedure Laterality Date  . ABDOMINAL HYSTERECTOMY  10/12/12  . ANKLE SURGERY Right 2010   Fractued ankle  . BREAST BIOPSY Bilateral @ 2013   core with clips  . FLEXIBLE BRONCHOSCOPY N/A 05/30/2017   Procedure: FLEXIBLE BRONCHOSCOPY;  Surgeon: Laverle Hobby, MD;  Location: [facility] ORS;  Service: Pulmonary;  Laterality: N/A;  . LAPAROSCOPIC SALPINGO OOPHERECTOMY Left 06/14/2017   Procedure: LAPAROSCOPIC SALPINGO OOPHORECTOMY;  Surgeon: Gae Dry, MD;  Location: [facility] ORS;  Service: Gynecology;  Laterality: Left;  . TUBAL LIGATION  2004  . TUBAL LIGATION  2004, [facility]  . VULVAR LESION REMOVAL  10/2009 & 05/2012  . VULVAR LESION REMOVAL N/A 04/07/2015   Procedure: VULVAR LESION;  Surgeon: Gillis Ends, MD;  Location: [facility] ORS;  Service: Gynecology;  Laterality: N/A;  with J-Plasma blade    MEDICATIONS: . ACCU-CHEK AVIVA PLUS test strip  . aspirin EC 81 MG tablet  . atorvastatin (LIPITOR) 40 MG tablet  . B-D ULTRAFINE III SHORT PEN 31G X 8 MM MISC  . Blood Glucose Monitoring Suppl (ACCU-CHEK AVIVA  PLUS) W/DEVICE KIT  . canagliflozin (INVOKANA) 300 MG TABS tablet  . celecoxib (CELEBREX) 100 MG capsule  . clobetasol ointment (TEMOVATE) 0.05 %  . ergocalciferol (VITAMIN D2) 50000 units capsule  . estradiol (MINIVELLE) 0.05 MG/24HR patch  . gabapentin (NEURONTIN) 300 MG capsule  . insulin aspart (NOVOLOG FLEXPEN) 100 UNIT/ML FlexPen  . Insulin Degludec (TRESIBA FLEXTOUCH) 200 UNIT/ML SOPN  . levothyroxine (SYNTHROID, LEVOTHROID) 75 MCG tablet  . PARoxetine Mesylate (BRISDELLE) 7.5 MG CAPS   No current facility-administered medications for this encounter.    . lidocaine (XYLOCAINE) 2 % jelly 1 application    George Hugh [facility] Short Stay Center/Anesthesiology Phone [phone] 04/16/2018 4:11 PM

## 2018-04-18 ENCOUNTER — Ambulatory Visit (HOSPITAL_COMMUNITY): Payer: BLUE CROSS/BLUE SHIELD

## 2018-04-18 ENCOUNTER — Ambulatory Visit (HOSPITAL_COMMUNITY): Payer: BLUE CROSS/BLUE SHIELD | Admitting: Anesthesiology

## 2018-04-18 ENCOUNTER — Observation Stay (HOSPITAL_COMMUNITY)
Admission: RE | Admit: 2018-04-18 | Discharge: 2018-04-19 | Disposition: A | Discharge status: Home / Self Care | Payer: BLUE CROSS/BLUE SHIELD | Source: Ambulatory Visit | Attending: Orthopedic Surgery | Admitting: Orthopedic Surgery

## 2018-04-18 ENCOUNTER — Ambulatory Visit (HOSPITAL_COMMUNITY)
Admission: RE | Disposition: A | Discharge status: Home / Self Care | Payer: Self-pay | Source: Ambulatory Visit | Attending: Orthopedic Surgery

## 2018-04-18 ENCOUNTER — Ambulatory Visit (HOSPITAL_COMMUNITY): Payer: BLUE CROSS/BLUE SHIELD | Admitting: Vascular Surgery

## 2018-04-18 DIAGNOSIS — E7800 Pure hypercholesterolemia, unspecified: Secondary | ICD-10-CM | POA: Diagnosis not present

## 2018-04-18 DIAGNOSIS — E119 Type 2 diabetes mellitus without complications: Secondary | ICD-10-CM | POA: Insufficient documentation

## 2018-04-18 DIAGNOSIS — Z79890 Hormone replacement therapy (postmenopausal): Secondary | ICD-10-CM | POA: Diagnosis not present

## 2018-04-18 DIAGNOSIS — I252 Old myocardial infarction: Secondary | ICD-10-CM | POA: Diagnosis not present

## 2018-04-18 DIAGNOSIS — M5412 Radiculopathy, cervical region: Secondary | ICD-10-CM | POA: Diagnosis not present

## 2018-04-18 DIAGNOSIS — M4802 Spinal stenosis, cervical region: Principal | ICD-10-CM | POA: Insufficient documentation

## 2018-04-18 DIAGNOSIS — Z794 Long term (current) use of insulin: Secondary | ICD-10-CM | POA: Insufficient documentation

## 2018-04-18 DIAGNOSIS — M5410 Radiculopathy, site unspecified: Secondary | ICD-10-CM | POA: Diagnosis present

## 2018-04-18 DIAGNOSIS — Z7982 Long term (current) use of aspirin: Secondary | ICD-10-CM | POA: Diagnosis not present

## 2018-04-18 DIAGNOSIS — Z79899 Other long term (current) drug therapy: Secondary | ICD-10-CM | POA: Diagnosis not present

## 2018-04-18 DIAGNOSIS — Z85820 Personal history of malignant melanoma of skin: Secondary | ICD-10-CM | POA: Diagnosis not present

## 2018-04-18 DIAGNOSIS — Z419 Encounter for procedure for purposes other than remedying health state, unspecified: Secondary | ICD-10-CM

## 2018-04-18 DIAGNOSIS — F17210 Nicotine dependence, cigarettes, uncomplicated: Secondary | ICD-10-CM | POA: Diagnosis not present

## 2018-04-18 DIAGNOSIS — Z882 Allergy status to sulfonamides status: Secondary | ICD-10-CM | POA: Diagnosis not present

## 2018-04-18 DIAGNOSIS — E039 Hypothyroidism, unspecified: Secondary | ICD-10-CM | POA: Insufficient documentation

## 2018-04-18 HISTORY — PX: ANTERIOR CERVICAL DECOMPRESSION/DISCECTOMY FUSION 4 LEVELS: SHX5556

## 2018-04-18 LAB — GLUCOSE, CAPILLARY
GLUCOSE-CAPILLARY: 103 mg/dL — AB (ref 70–99)
GLUCOSE-CAPILLARY: 100 mg/dL — AB (ref 70–99)
Glucose-Capillary: 131 mg/dL — ABNORMAL HIGH (ref 70–99)
Glucose-Capillary: 225 mg/dL — ABNORMAL HIGH (ref 70–99)

## 2018-04-18 SURGERY — ANTERIOR CERVICAL DECOMPRESSION/DISCECTOMY FUSION 4 LEVELS
Anesthesia: General

## 2018-04-18 MED ORDER — ONDANSETRON HCL 4 MG PO TABS: 4.0000 mg | ORAL_TABLET | Freq: Four times a day (QID) | ORAL | Status: DC | PRN

## 2018-04-18 MED ORDER — HYDROXYZINE HCL 50 MG/ML IM SOLN
50.0000 mg | Freq: Four times a day (QID) | INTRAMUSCULAR | Status: DC | PRN
  Administered 2018-04-18: 50 mg via INTRAMUSCULAR
  Filled 2018-04-18: qty 1

## 2018-04-18 MED ORDER — PROPOFOL 1000 MG/100ML IV EMUL
INTRAVENOUS | Status: AC
  Filled 2018-04-18: qty 100

## 2018-04-18 MED ORDER — LIDOCAINE 2% (20 MG/ML) 5 ML SYRINGE
INTRAMUSCULAR | Status: AC
  Filled 2018-04-18: qty 5

## 2018-04-18 MED ORDER — ROCURONIUM BROMIDE 50 MG/5ML IV SOSY
PREFILLED_SYRINGE | INTRAVENOUS | Status: AC
  Filled 2018-04-18: qty 5

## 2018-04-18 MED ORDER — FENTANYL CITRATE (PF) 250 MCG/5ML IJ SOLN
INTRAMUSCULAR | Status: AC
  Filled 2018-04-18: qty 5

## 2018-04-18 MED ORDER — GABAPENTIN 300 MG PO CAPS
300.0000 mg | ORAL_CAPSULE | Freq: Three times a day (TID) | ORAL | Status: DC
  Administered 2018-04-18 (×2): 300 mg via ORAL
  Filled 2018-04-18 (×2): qty 1

## 2018-04-18 MED ORDER — HYDROMORPHONE HCL 1 MG/ML IJ SOLN
INTRAMUSCULAR | Status: AC
  Filled 2018-04-18: qty 1

## 2018-04-18 MED ORDER — ALUM & MAG HYDROXIDE-SIMETH 200-200-20 MG/5ML PO SUSP: 30.0000 mL | Freq: Four times a day (QID) | ORAL | Status: DC | PRN

## 2018-04-18 MED ORDER — OXYCODONE HCL 5 MG/5ML PO SOLN: 5.0000 mg | Freq: Once | ORAL | Status: DC | PRN

## 2018-04-18 MED ORDER — BUPIVACAINE-EPINEPHRINE 0.25% -1:200000 IJ SOLN: INTRAMUSCULAR | Status: DC | PRN

## 2018-04-18 MED ORDER — INSULIN ASPART 100 UNIT/ML ~~LOC~~ SOLN: 2.0000 [IU] | Freq: Three times a day (TID) | SUBCUTANEOUS | Status: DC

## 2018-04-18 MED ORDER — OXYCODONE HCL 5 MG PO TABS: 5.0000 mg | ORAL_TABLET | Freq: Once | ORAL | Status: DC | PRN

## 2018-04-18 MED ORDER — ACETAMINOPHEN 325 MG PO TABS: 650.0000 mg | ORAL_TABLET | ORAL | Status: DC | PRN

## 2018-04-18 MED ORDER — INSULIN DEGLUDEC 200 UNIT/ML ~~LOC~~ SOPN: 46.0000 [IU] | PEN_INJECTOR | Freq: Every day | SUBCUTANEOUS | Status: DC

## 2018-04-18 MED ORDER — BUPIVACAINE-EPINEPHRINE (PF) 0.25% -1:200000 IJ SOLN
INTRAMUSCULAR | Status: DC | PRN
  Administered 2018-04-18: 6 mL

## 2018-04-18 MED ORDER — SENNOSIDES-DOCUSATE SODIUM 8.6-50 MG PO TABS: 1.0000 | ORAL_TABLET | Freq: Every evening | ORAL | Status: DC | PRN

## 2018-04-18 MED ORDER — FENTANYL CITRATE (PF) 100 MCG/2ML IJ SOLN
INTRAMUSCULAR | Status: DC | PRN
  Administered 2018-04-18: 150 ug via INTRAVENOUS
  Administered 2018-04-18 (×8): 50 ug via INTRAVENOUS

## 2018-04-18 MED ORDER — ACETAMINOPHEN 650 MG RE SUPP: 650.0000 mg | RECTAL | Status: DC | PRN

## 2018-04-18 MED ORDER — OXYCODONE-ACETAMINOPHEN 5-325 MG PO TABS
1.0000 | ORAL_TABLET | ORAL | Status: DC | PRN
  Administered 2018-04-18: 2 via ORAL
  Filled 2018-04-18 (×2): qty 2

## 2018-04-18 MED ORDER — DIAZEPAM 5 MG PO TABS
5.0000 mg | ORAL_TABLET | Freq: Four times a day (QID) | ORAL | Status: DC | PRN
  Administered 2018-04-18: 5 mg via ORAL
  Filled 2018-04-18: qty 1

## 2018-04-18 MED ORDER — SODIUM CHLORIDE 0.9% FLUSH: 3.0000 mL | INTRAVENOUS | Status: DC | PRN

## 2018-04-18 MED ORDER — THROMBIN (RECOMBINANT) 20000 UNITS EX SOLR
CUTANEOUS | Status: AC
  Filled 2018-04-18: qty 20000

## 2018-04-18 MED ORDER — ALBUTEROL SULFATE HFA 108 (90 BASE) MCG/ACT IN AERS
INHALATION_SPRAY | RESPIRATORY_TRACT | Status: AC
  Filled 2018-04-18: qty 6.7

## 2018-04-18 MED ORDER — ONDANSETRON HCL 4 MG/2ML IJ SOLN
INTRAMUSCULAR | Status: DC | PRN
  Administered 2018-04-18: 4 mg via INTRAVENOUS

## 2018-04-18 MED ORDER — BUPIVACAINE-EPINEPHRINE (PF) 0.25% -1:200000 IJ SOLN
INTRAMUSCULAR | Status: AC
  Filled 2018-04-18: qty 30

## 2018-04-18 MED ORDER — INSULIN ASPART 100 UNIT/ML ~~LOC~~ SOLN: 0.0000 [IU] | Freq: Three times a day (TID) | SUBCUTANEOUS | Status: DC

## 2018-04-18 MED ORDER — ZOLPIDEM TARTRATE 5 MG PO TABS: 5.0000 mg | ORAL_TABLET | Freq: Every evening | ORAL | Status: DC | PRN

## 2018-04-18 MED ORDER — 0.9 % SODIUM CHLORIDE (POUR BTL) OPTIME
TOPICAL | Status: DC | PRN
  Administered 2018-04-18: 1000 mL

## 2018-04-18 MED ORDER — ALBUTEROL SULFATE HFA 108 (90 BASE) MCG/ACT IN AERS
INHALATION_SPRAY | RESPIRATORY_TRACT | Status: DC | PRN
  Administered 2018-04-18 (×5): 2 via RESPIRATORY_TRACT

## 2018-04-18 MED ORDER — HYDROMORPHONE HCL 1 MG/ML IJ SOLN
0.2500 mg | INTRAMUSCULAR | Status: DC | PRN
  Administered 2018-04-18 (×4): 0.5 mg via INTRAVENOUS

## 2018-04-18 MED ORDER — KETAMINE HCL 50 MG/5ML IJ SOSY
PREFILLED_SYRINGE | INTRAMUSCULAR | Status: AC
  Filled 2018-04-18: qty 5

## 2018-04-18 MED ORDER — FLEET ENEMA 7-19 GM/118ML RE ENEM: 1.0000 | ENEMA | Freq: Once | RECTAL | Status: DC | PRN

## 2018-04-18 MED ORDER — PROPOFOL 10 MG/ML IV BOLUS
INTRAVENOUS | Status: DC | PRN
  Administered 2018-04-18: 200 mg via INTRAVENOUS

## 2018-04-18 MED ORDER — BISACODYL 5 MG PO TBEC: 5.0000 mg | DELAYED_RELEASE_TABLET | Freq: Every day | ORAL | Status: DC | PRN

## 2018-04-18 MED ORDER — SUGAMMADEX SODIUM 200 MG/2ML IV SOLN
INTRAVENOUS | Status: DC | PRN
  Administered 2018-04-18: 300 mg via INTRAVENOUS

## 2018-04-18 MED ORDER — CEFAZOLIN SODIUM-DEXTROSE 2-4 GM/100ML-% IV SOLN
2.0000 g | INTRAVENOUS | Status: AC
  Administered 2018-04-18: 2 g via INTRAVENOUS

## 2018-04-18 MED ORDER — PAROXETINE MESYLATE 7.5 MG PO CAPS: 7.5000 mg | ORAL_CAPSULE | Freq: Every day | ORAL | Status: DC

## 2018-04-18 MED ORDER — POVIDONE-IODINE 7.5 % EX SOLN
Freq: Once | CUTANEOUS | Status: DC
  Filled 2018-04-18: qty 118

## 2018-04-18 MED ORDER — ATORVASTATIN CALCIUM 20 MG PO TABS
40.0000 mg | ORAL_TABLET | Freq: Every day | ORAL | Status: DC
  Administered 2018-04-18: 40 mg via ORAL
  Filled 2018-04-18: qty 2

## 2018-04-18 MED ORDER — MIDAZOLAM HCL 2 MG/2ML IJ SOLN
INTRAMUSCULAR | Status: AC
  Filled 2018-04-18: qty 2

## 2018-04-18 MED ORDER — PANTOPRAZOLE SODIUM 40 MG PO TBEC
40.0000 mg | DELAYED_RELEASE_TABLET | Freq: Two times a day (BID) | ORAL | Status: DC
  Administered 2018-04-18 – 2018-04-19 (×2): 40 mg via ORAL
  Filled 2018-04-18 (×2): qty 1

## 2018-04-18 MED ORDER — KETAMINE HCL 10 MG/ML IJ SOLN
INTRAMUSCULAR | Status: DC | PRN
  Administered 2018-04-18: 25 mg via INTRAVENOUS

## 2018-04-18 MED ORDER — DEXMEDETOMIDINE HCL IN NACL 200 MCG/50ML IV SOLN
INTRAVENOUS | Status: DC | PRN
  Administered 2018-04-18 (×2): 8 ug via INTRAVENOUS

## 2018-04-18 MED ORDER — MENTHOL 3 MG MT LOZG: 1.0000 | LOZENGE | OROMUCOSAL | Status: DC | PRN

## 2018-04-18 MED ORDER — MIDAZOLAM HCL 5 MG/5ML IJ SOLN
INTRAMUSCULAR | Status: DC | PRN
  Administered 2018-04-18: 2 mg via INTRAVENOUS

## 2018-04-18 MED ORDER — LEVOTHYROXINE SODIUM 75 MCG PO TABS
75.0000 ug | ORAL_TABLET | Freq: Every day | ORAL | Status: DC
  Administered 2018-04-19: 75 ug via ORAL
  Filled 2018-04-18: qty 1

## 2018-04-18 MED ORDER — VITAMIN D (ERGOCALCIFEROL) 1.25 MG (50000 UNIT) PO CAPS: 50000.0000 [IU] | ORAL_CAPSULE | ORAL | Status: DC

## 2018-04-18 MED ORDER — DOCUSATE SODIUM 100 MG PO CAPS
100.0000 mg | ORAL_CAPSULE | Freq: Two times a day (BID) | ORAL | Status: DC
  Administered 2018-04-18: 100 mg via ORAL
  Filled 2018-04-18: qty 1

## 2018-04-18 MED ORDER — THROMBIN 20000 UNITS EX SOLR
CUTANEOUS | Status: DC | PRN
  Administered 2018-04-18: 20000 [IU] via TOPICAL

## 2018-04-18 MED ORDER — LIDOCAINE 2% (20 MG/ML) 5 ML SYRINGE
INTRAMUSCULAR | Status: DC | PRN
  Administered 2018-04-18: 60 mg via INTRAVENOUS

## 2018-04-18 MED ORDER — ONDANSETRON HCL 4 MG/2ML IJ SOLN: 4.0000 mg | Freq: Four times a day (QID) | INTRAMUSCULAR | Status: DC | PRN

## 2018-04-18 MED ORDER — PHENOL 1.4 % MT LIQD: 1.0000 | OROMUCOSAL | Status: DC | PRN

## 2018-04-18 MED ORDER — LACTATED RINGERS IV SOLN
INTRAVENOUS | Status: DC | PRN
  Administered 2018-04-18 (×2): via INTRAVENOUS

## 2018-04-18 MED ORDER — ROCURONIUM BROMIDE 50 MG/5ML IV SOSY
PREFILLED_SYRINGE | INTRAVENOUS | Status: DC | PRN
  Administered 2018-04-18 (×2): 50 mg via INTRAVENOUS
  Administered 2018-04-18: 30 mg via INTRAVENOUS

## 2018-04-18 MED ORDER — INSULIN GLARGINE 100 UNIT/ML ~~LOC~~ SOLN
46.0000 [IU] | Freq: Every day | SUBCUTANEOUS | Status: DC
  Administered 2018-04-18: 46 [IU] via SUBCUTANEOUS
  Filled 2018-04-18: qty 0.46

## 2018-04-18 MED ORDER — PROPOFOL 10 MG/ML IV BOLUS
INTRAVENOUS | Status: AC
  Filled 2018-04-18: qty 20

## 2018-04-18 MED ORDER — PROMETHAZINE HCL 25 MG/ML IJ SOLN: 6.2500 mg | INTRAMUSCULAR | Status: DC | PRN

## 2018-04-18 MED ORDER — CEFAZOLIN SODIUM-DEXTROSE 2-4 GM/100ML-% IV SOLN
2.0000 g | Freq: Three times a day (TID) | INTRAVENOUS | Status: AC
  Administered 2018-04-18 (×2): 2 g via INTRAVENOUS
  Filled 2018-04-18 (×2): qty 100

## 2018-04-18 MED ORDER — SODIUM CHLORIDE 0.9% FLUSH
3.0000 mL | Freq: Two times a day (BID) | INTRAVENOUS | Status: DC
  Administered 2018-04-18 (×2): 3 mL via INTRAVENOUS

## 2018-04-18 MED ORDER — CANAGLIFLOZIN 300 MG PO TABS
300.0000 mg | ORAL_TABLET | Freq: Every day | ORAL | Status: DC
  Administered 2018-04-19: 300 mg via ORAL
  Filled 2018-04-18: qty 1

## 2018-04-18 MED ORDER — ONDANSETRON HCL 4 MG/2ML IJ SOLN
INTRAMUSCULAR | Status: AC
  Filled 2018-04-18: qty 2

## 2018-04-18 MED ORDER — CEFAZOLIN SODIUM-DEXTROSE 2-4 GM/100ML-% IV SOLN
INTRAVENOUS | Status: AC
  Filled 2018-04-18: qty 100

## 2018-04-18 SURGICAL SUPPLY — 79 items
APL SKNCLS STERI-STRIP NONHPOA (GAUZE/BANDAGES/DRESSINGS) ×1
BENZOIN TINCTURE PRP APPL 2/3 (GAUZE/BANDAGES/DRESSINGS) ×3 IMPLANT
BIT DRILL NEURO 2X3.1 SFT TUCH (MISCELLANEOUS) ×1 IMPLANT
BIT DRILL SRG 14X2.2XFLT CHK (BIT) IMPLANT
BIT DRL SRG 14X2.2XFLT CHK (BIT) ×1
BLADE CLIPPER SURG (BLADE) ×3 IMPLANT
BLADE SURG 15 STRL LF DISP TIS (BLADE) ×1 IMPLANT
BLADE SURG 15 STRL SS (BLADE) ×3
BONE VIVIGEN FORMABLE 1.3CC (Bone Implant) ×6 IMPLANT
BUR MATCHSTICK NEURO 3.0 LAGG (BURR) IMPLANT
CARTRIDGE OIL MAESTRO DRILL (MISCELLANEOUS) ×1 IMPLANT
CLOSURE STERI-STRIP 1/2X4 (GAUZE/BANDAGES/DRESSINGS) ×1
CLOSURE WOUND 1/2 X4 (GAUZE/BANDAGES/DRESSINGS) ×1
CLSR STERI-STRIP ANTIMIC 1/2X4 (GAUZE/BANDAGES/DRESSINGS) ×1 IMPLANT
COVER SURGICAL LIGHT HANDLE (MISCELLANEOUS) ×3 IMPLANT
CRADLE DONUT ADULT HEAD (MISCELLANEOUS) ×3 IMPLANT
DECANTER SPIKE VIAL GLASS SM (MISCELLANEOUS) ×3 IMPLANT
DEVICE ENDSKLTN CRVCL 5MM-0SM (Orthopedic Implant) IMPLANT
DIFFUSER DRILL AIR PNEUMATIC (MISCELLANEOUS) ×3 IMPLANT
DRAIN JACKSON RD 7FR 3/32 (WOUND CARE) IMPLANT
DRAPE C-ARM 42X72 X-RAY (DRAPES) ×3 IMPLANT
DRAPE POUCH INSTRU U-SHP 10X18 (DRAPES) ×3 IMPLANT
DRAPE SURG 17X23 STRL (DRAPES) ×9 IMPLANT
DRILL BIT SKYLINE 14MM (BIT) ×3
DRILL NEURO 2X3.1 SOFT TOUCH (MISCELLANEOUS) ×3
DURAPREP 26ML APPLICATOR (WOUND CARE) ×3 IMPLANT
ELECT COATED BLADE 2.86 ST (ELECTRODE) ×3 IMPLANT
ELECT REM PT RETURN 9FT ADLT (ELECTROSURGICAL) ×3
ELECTRODE REM PT RTRN 9FT ADLT (ELECTROSURGICAL) ×1 IMPLANT
ENDOSKELETON CERVICAL 5MM-0SM (Orthopedic Implant) ×3 IMPLANT
EVACUATOR SILICONE 100CC (DRAIN) IMPLANT
GAUZE 4X4 16PLY RFD (DISPOSABLE) ×3 IMPLANT
GAUZE SPONGE 4X4 12PLY STRL (GAUZE/BANDAGES/DRESSINGS) ×3 IMPLANT
GLOVE BIO SURGEON STRL SZ7 (GLOVE) ×3 IMPLANT
GLOVE BIO SURGEON STRL SZ8 (GLOVE) ×3 IMPLANT
GLOVE BIOGEL PI IND STRL 7.0 (GLOVE) ×2 IMPLANT
GLOVE BIOGEL PI IND STRL 8 (GLOVE) ×1 IMPLANT
GLOVE BIOGEL PI INDICATOR 7.0 (GLOVE) ×4
GLOVE BIOGEL PI INDICATOR 8 (GLOVE) ×2
GOWN STRL REUS W/ TWL LRG LVL3 (GOWN DISPOSABLE) ×1 IMPLANT
GOWN STRL REUS W/ TWL XL LVL3 (GOWN DISPOSABLE) ×1 IMPLANT
GOWN STRL REUS W/TWL LRG LVL3 (GOWN DISPOSABLE) ×3
GOWN STRL REUS W/TWL XL LVL3 (GOWN DISPOSABLE) ×3
GRAFT BNE MATRIX VG FRMBL SM 1 (Bone Implant) IMPLANT
INTERLOCK LRDTC CRVCL VBR 6MM (Bone Implant) IMPLANT
IV CATH 14GX2 1/4 (CATHETERS) ×3 IMPLANT
KIT BASIN OR (CUSTOM PROCEDURE TRAY) ×3 IMPLANT
KIT TURNOVER KIT B (KITS) ×3 IMPLANT
LORDOTIC CERVICAL VBR 6MM SM (Bone Implant) ×6 IMPLANT
MANIFOLD NEPTUNE II (INSTRUMENTS) ×3 IMPLANT
NDL PRECISIONGLIDE 27X1.5 (NEEDLE) ×1 IMPLANT
NDL SPNL 20GX3.5 QUINCKE YW (NEEDLE) ×1 IMPLANT
NEEDLE PRECISIONGLIDE 27X1.5 (NEEDLE) ×3 IMPLANT
NEEDLE SPNL 20GX3.5 QUINCKE YW (NEEDLE) ×3 IMPLANT
NS IRRIG 1000ML POUR BTL (IV SOLUTION) ×3 IMPLANT
OIL CARTRIDGE MAESTRO DRILL (MISCELLANEOUS) ×3
PACK ORTHO CERVICAL (CUSTOM PROCEDURE TRAY) ×3 IMPLANT
PAD ARMBOARD 7.5X6 YLW CONV (MISCELLANEOUS) ×6 IMPLANT
PATTIES SURGICAL .5 X.5 (GAUZE/BANDAGES/DRESSINGS) IMPLANT
PATTIES SURGICAL .5 X1 (DISPOSABLE) IMPLANT
PIN DISTRACTION 14 (PIN) ×4 IMPLANT
PLATE SKYLINE 3LVL 45MM CERV (Plate) ×2 IMPLANT
SCREW SKYLINE CONSTR 14MM (Screw) ×16 IMPLANT
SPONGE INTESTINAL PEANUT (DISPOSABLE) ×6 IMPLANT
SPONGE SURGIFOAM ABS GEL 100 (HEMOSTASIS) ×3 IMPLANT
STRIP CLOSURE SKIN 1/2X4 (GAUZE/BANDAGES/DRESSINGS) ×2 IMPLANT
SURGIFLO W/THROMBIN 8M KIT (HEMOSTASIS) IMPLANT
SUT MNCRL AB 4-0 PS2 18 (SUTURE) ×3 IMPLANT
SUT VIC AB 2-0 CT2 18 VCP726D (SUTURE) ×3 IMPLANT
SYR BULB IRRIGATION 50ML (SYRINGE) ×3 IMPLANT
SYR CONTROL 10ML LL (SYRINGE) ×6 IMPLANT
TAPE CLOTH 4X10 WHT NS (GAUZE/BANDAGES/DRESSINGS) ×3 IMPLANT
TAPE CLOTH SURG 4X10 WHT LF (GAUZE/BANDAGES/DRESSINGS) ×2 IMPLANT
TAPE UMBILICAL COTTON 1/8X30 (MISCELLANEOUS) ×3 IMPLANT
TOWEL OR 17X24 6PK STRL BLUE (TOWEL DISPOSABLE) ×3 IMPLANT
TOWEL OR 17X26 10 PK STRL BLUE (TOWEL DISPOSABLE) ×3 IMPLANT
TRAY FOLEY MTR SLVR 16FR STAT (SET/KITS/TRAYS/PACK) ×3 IMPLANT
WATER STERILE IRR 1000ML POUR (IV SOLUTION) ×3 IMPLANT
YANKAUER SUCT BULB TIP NO VENT (SUCTIONS) ×3 IMPLANT

## 2018-04-18 NOTE — Anesthesia Preprocedure Evaluation (Addendum)
Anesthesia Evaluation  Patient identified by MRN, date of birth, ID band Patient awake    Reviewed: Allergy & Precautions, NPO status , Patient's Chart, lab work & pertinent test results  Airway Mallampati: II  TM Distance: >3 FB Neck ROM: Full    Dental no notable dental hx.    Pulmonary Current Smoker,    Pulmonary exam normal breath sounds clear to auscultation       Cardiovascular + CAD and + Past MI  Normal cardiovascular exam Rhythm:Regular Rate:Normal  ECG: NSR, rate 78  She reports that she continues to see cardiologist Dr. Neoma Laming every 6-12 months with last visit 09/17/17 and next visit due 09/2018 with routine echo.    Neuro/Psych  Headaches, PSYCHIATRIC DISORDERS Anxiety    GI/Hepatic negative GI ROS, Neg liver ROS,   Endo/Other  diabetes, Insulin DependentHypothyroidism   Renal/GU negative Renal ROS     Musculoskeletal negative musculoskeletal ROS (+)   Abdominal   Peds  Hematology HLD   Anesthesia Other Findings BILATERAL CERVICAL RADICULOPATHY  Reproductive/Obstetrics S/p BTL                           Anesthesia Physical Anesthesia Plan  ASA: III  Anesthesia Plan: General   Post-op Pain Management:    Induction: Intravenous  PONV Risk Score and Plan: 2 and Midazolam, Ondansetron and Treatment may vary due to age or medical condition  Airway Management Planned: Oral ETT and Video Laryngoscope Planned  Additional Equipment:   Intra-op Plan:   Post-operative Plan: Extubation in OR  Informed Consent: I have reviewed the patients History and Physical, chart, labs and discussed the procedure including the risks, benefits and alternatives for the proposed anesthesia with the patient or authorized representative who has indicated his/her understanding and acceptance.   Dental advisory given  Plan Discussed with: CRNA  Anesthesia Plan Comments:         Anesthesia Quick Evaluation

## 2018-04-18 NOTE — H&P (Signed)
PREOPERATIVE H&P  Chief Complaint: Bilateral arm pain  HPI: Marcia [name] is a 47 y.o. female who presents with ongoing pain in the bilateral arms  MRI reveals spinal stenosis spanning C4-C7  Patient has failed multiple forms of conservative care and continues to have pain (see office notes for additional details regarding the patient's full course of treatment)  Past Medical History:  Diagnosis Date  . Diabetes mellitus   . Hay fever    Allergies  . Headache    only from neck pain  . Heart murmur    at birth   . History of HPV infection   . Hypercholesterolemia   . Hypothyroidism   . Melanoma of skin ([place]) 2010   In her vaginal area with surgical resection.   . Myocardial infarction ([place]) 1997   At age 69  . Ovarian cyst 05/09/2017  . Radiculopathy of cervical region    B/L  . Thyroid disease   . Tobacco abuse   . Vulvar lesion   . Wears glasses    Past Surgical History:  Procedure Laterality Date  . ABDOMINAL HYSTERECTOMY  10/12/12  . ANKLE SURGERY Right 2010   Fractued ankle  . BREAST BIOPSY Bilateral @ 2013   core with clips  . FLEXIBLE BRONCHOSCOPY N/A 05/30/2017   Procedure: FLEXIBLE BRONCHOSCOPY;  Surgeon: Laverle Hobby, MD;  Location: [facility] ORS;  Service: Pulmonary;  Laterality: N/A;  . LAPAROSCOPIC SALPINGO OOPHERECTOMY Left 06/14/2017   Procedure: LAPAROSCOPIC SALPINGO OOPHORECTOMY;  Surgeon: Gae Dry, MD;  Location: [facility] ORS;  Service: Gynecology;  Laterality: Left;  . TUBAL LIGATION  2004  . TUBAL LIGATION  2004, [facility]  . VULVAR LESION REMOVAL  10/2009 & 05/2012  . VULVAR LESION REMOVAL N/A 04/07/2015   Procedure: VULVAR LESION;  Surgeon: Gillis Ends, MD;  Location: [facility] ORS;  Service: Gynecology;  Laterality: N/A;  with J-Plasma blade   Social History   Socioeconomic History  . Marital status: Married    Spouse name: Not on file  . Number of children: Not on file  . Years of education: Not on file  . Highest  education level: Not on file  Occupational History  . Not on file  Social Needs  . Financial resource strain: Not on file  . Food insecurity:    Worry: Not on file    Inability: Not on file  . Transportation needs:    Medical: Not on file    Non-medical: Not on file  Tobacco Use  . Smoking status: Current Every Day Smoker    Packs/day: 0.25    Years: 22.00    Pack years: 5.50    Types: Cigarettes    Last attempt to quit: 03/07/2018    Years since quitting: 0.1  . Smokeless tobacco: Never Used  . Tobacco comment: 4 cigarettes per day  Substance and Sexual Activity  . Alcohol use: Yes    Alcohol/week: 0.0 standard drinks    Comment: occasional beer  . Drug use: No  . Sexual activity: Yes    Birth control/protection: Surgical  Lifestyle  . Physical activity:    Days per week: Not on file    Minutes per session: Not on file  . Stress: Not on file  Relationships  . Social connections:    Talks on phone: Not on file    Gets together: Not on file    Attends religious service: Not on file    Active member of club or organization:  Not on file    Attends meetings of clubs or organizations: Not on file    Relationship status: Not on file  Other Topics Concern  . Not on file  Social History Narrative   No regular exercise.   Lives with spouse.   Family History  Problem Relation Age of Onset  . Diabetes Father   . Hypertension Mother   . Hyperlipidemia Mother   . Diabetes Mother   . Aneurysm Brother   . Breast cancer Maternal Aunt 40       lung and ovarian   Allergies  Allergen Reactions  . Chantix [Varenicline] Rash  . Septra [Sulfamethoxazole-Trimethoprim] Rash   Prior to Admission medications   Medication Sig Start Date End Date Taking? Authorizing Provider  aspirin EC 81 MG tablet Take 81 mg by mouth daily.   Yes [provider]  atorvastatin (LIPITOR) 40 MG tablet Take 40 mg by mouth at bedtime.    Yes [provider]  canagliflozin (INVOKANA)  300 MG TABS tablet Take 300 mg by mouth daily before breakfast.   Yes [provider]  celecoxib (CELEBREX) 100 MG capsule Take 100 mg by mouth 2 (two) times daily.   Yes [provider]  ergocalciferol (VITAMIN D2) 50000 units capsule Take 50,000 Units by mouth every Saturday.    Yes [provider]  gabapentin (NEURONTIN) 300 MG capsule Take 300 mg by mouth 3 (three) times daily.   Yes [provider]  insulin aspart (NOVOLOG FLEXPEN) 100 UNIT/ML FlexPen 4 units with breakfast, and 4 units with the evening meal. Patient taking differently: Inject 2-12 Units into the skin 3 (three) times daily with meals. Per sliding scale 12/14/14  Yes Renato Shin, MD  Insulin Degludec (TRESIBA FLEXTOUCH) 200 UNIT/ML SOPN Inject 46 Units into the skin at bedtime.   Yes [provider]  levothyroxine (SYNTHROID, LEVOTHROID) 75 MCG tablet Take 75 mcg by mouth daily before breakfast.   Yes [provider]  PARoxetine Mesylate (BRISDELLE) 7.5 MG CAPS Take 7.5 mg by mouth daily before breakfast.    Yes [provider]  ACCU-CHEK AVIVA PLUS test strip TEST 2 TIMES A DAY Patient not taking: Reported on 04/11/2018 06/07/15   Renato Shin, MD  B-D ULTRAFINE III SHORT PEN 31G X 8 MM MISC USE TWO NEEDLES DAILY Patient not taking: Reported on 04/11/2018 05/10/15   Renato Shin, MD  Blood Glucose Monitoring Suppl (ACCU-CHEK AVIVA PLUS) W/DEVICE KIT 1 Device by Does not apply route once. Patient not taking: Reported on 04/11/2018 05/26/14   Renato Shin, MD  clobetasol ointment (TEMOVATE) 1.61 % Apply 1 application topically every other day. At night Patient not taking: Reported on 04/11/2018 03/20/18   Verlon Au, NP  estradiol (MINIVELLE) 0.05 MG/24HR patch Place 1 patch (0.05 mg total) 2 (two) times a week onto the skin. Patient not taking: Reported on 04/11/2018 06/25/17   Gae Dry, MD     All other systems have been reviewed and were otherwise negative  with the exception of those mentioned in the HPI and as above.  Physical Exam: Vitals:   04/18/18 0612  BP: 107/60  Pulse: 71  Resp: 18  Temp: 98.2 F (36.8 C)  SpO2: 95%    There is no height or weight on file to calculate BMI.  General: Alert, no acute distress Cardiovascular: No pedal edema Respiratory: No cyanosis, no use of accessory musculature Skin: No lesions in the area of chief complaint Neurologic: Sensation intact distally  Psychiatric: Patient is competent for consent with normal mood and affect Lymphatic: No axillary or cervical lymphadenopathy   Assessment/Plan: BILATERAL CERVICAL RADICULOPATHY Plan for Procedure(s): ANTERIOR CERVICAL DECOMPRESSION FUSION. CERVICAL 4-5, CERVICAL 5-6, CERVICAL 6-7 WITH INSTRUMENTATION AND ALLOGRAFT.   Sinclair Ship, MD 04/18/2018 6:41 AM

## 2018-04-18 NOTE — Transfer of Care (Signed)
Immediate Anesthesia Transfer of Care Note  Patient: Marcia [name]  Procedure(s) Performed: ANTERIOR CERVICAL DECOMPRESSION FUSION. CERVICAL 4-5, CERVICAL 5-6, CERVICAL 6-7 WITH INSTRUMENTATION AND ALLOGRAFT. (N/A )  Patient Location: PACU  Anesthesia Type:General  Level of Consciousness: awake and patient cooperative  Airway & Oxygen Therapy: Patient Spontanous Breathing and Patient connected to face mask oxygen  Post-op Assessment: Report given to RN and Post -op Vital signs reviewed and stable  Post vital signs: Reviewed and stable  Last Vitals:  Vitals Value Taken Time  BP    Temp    Pulse 80 04/18/2018 10:53 AM  Resp 19 04/18/2018 10:53 AM  SpO2 96 % 04/18/2018 10:53 AM  Vitals shown include unvalidated device data.  Last Pain:  Vitals:   04/18/18 0630  TempSrc:   PainSc: 9          Complications: No apparent anesthesia complications

## 2018-04-18 NOTE — Op Note (Signed)
NAME:  Marcia [name]                MEDICAL RECORD NO:  [id number]  PHYSICIAN:  Phylliss Bob, MD      DATE OF BIRTH:  October 31, 1970  DATE OF PROCEDURE:  04/18/2018                              OPERATIVE REPORT   PREOPERATIVE DIAGNOSES: 1. Bilateral cervical radiculopathy. 2. Spinal stenosis spanning C4-C7  POSTOPERATIVE DIAGNOSES: 1. Bilateral cervical radiculopathy. 2. Spinal stenosis spanning C4-C7  PROCEDURE: 1. Anterior cervical decompression and fusion C4/5, C5/6, C6/7. 2. Placement of anterior instrumentation, C4-C7. 3. Insertion of interbody device x3 (Titan intervertebral spacers). 4. Intraoperative use of fluoroscopy. 5. Use of morselized allograft - ViviGen.  SURGEON:  Phylliss Bob, MD  ASSISTANT:  Pricilla Holm, PA-C.  ANESTHESIA:  General endotracheal anesthesia.  COMPLICATIONS:  None.  DISPOSITION:  Stable.  ESTIMATED BLOOD LOSS:  Minimal.  INDICATIONS FOR SURGERY:  Briefly, Marcia [name] is a pleasant 47 year old female, who did present to me with ongoing severe pain in her bilateral arms.  The patient's MRI did reveal the findings noted above.  Given the patient's ongoing rather debilitating pain and lack of improvement with appropriate treatment measures, we did discuss proceeding with the procedure noted above.  The patient was fully aware of the risks and limitations of surgery as outlined in my preoperative note.  OPERATIVE DETAILS:  On 04/18/2018, the patient was brought to surgery and general endotracheal anesthesia was administered.  The patient was placed supine on the hospital bed. The neck was gently extended.  All bony prominences were meticulously padded.  The neck was prepped and draped in the usual sterile fashion.  At this point, I did make a left-sided transverse incision.  The platysma was incised.  A Smith-Robinson approach was used and the anterior spine was identified. A self-retaining retractor was placed.  I  then subperiosteally exposed the vertebral bodies from C4-C7.  Caspar pins were then placed into the C6 and C7 vertebral bodies and distraction was applied.  A thorough and complete C6-7 intervertebral diskectomy was performed.  The posterior longitudinal ligament was identified and entered using a nerve hook.  I then used #1 followed by #2 Kerrison to perform a thorough and complete intervertebral diskectomy.  The spinal canal was thoroughly decompressed, as was the right and left neuroforamen.  The endplates were then prepared and the appropriate-sized intervertebral spacer was then packed with ViviGen and tamped into position in the usual fashion.  The lower Caspar pin was then removed and placed into the C5 vertebral body and once again, distraction was applied across the C5-6 intervertebral space.  I then again performed a thorough and complete diskectomy, thoroughly decompressing the spinal canal and bilateral neuroforamena.  After preparing the endplates, the appropriate-sized intervertebral spacer was packed with ViviGen and tamped into position.  The lower Caspar pin was then removed and placed into the C4 vertebral body and once again, distraction was applied across the C4-5 intervertebral space.  I then again performed a thorough and complete diskectomy, thoroughly decompressing the spinal canal and bilateral neuroforamena.  After preparing the endplates, the appropriate-sized intervertebral spacer was packed with ViviGen and tamped into position.  The Caspar pins then were removed and bone wax was placed in their place.  The appropriate-sized anterior cervical plate was placed over the anterior spine.  14 mm variable angle screws were  placed, 2 in each vertebral body from C4-C7 for a total of 8 vertebral body screws.  The screws were then locked to the plate using the Cam locking mechanism.  I was very pleased with the final fluoroscopic images.  The wound was then irrigated.   The wound was then explored for any undue bleeding and there was no bleeding noted. The wound was then closed in layers using 2-0 Vicryl, followed by 4-0 Monocryl.  Benzoin and Steri-Strips were applied, followed by sterile dressing.  All instrument counts were correct at the termination of the procedure.  Of note, Pricilla Holm, PA-C, was my assistant throughout surgery, and did aid in retraction, suctioning, and closure from start to finish.     Phylliss Bob, MD

## 2018-04-18 NOTE — Progress Notes (Signed)
Orthopedic Tech Progress Note Patient Details:  Marcia [name] 08/14/1970 [id number]  Ortho Devices Type of Ortho Device: [place] cervical collar   Post Interventions Instructions Provided: Care of device   [name] 04/18/2018, 2:57 PM

## 2018-04-18 NOTE — Anesthesia Procedure Notes (Signed)
Procedure Name: Intubation Date/Time: 04/18/2018 7:41 AM Performed by: Lance Coon, CRNA Pre-anesthesia Checklist: Patient identified, Emergency Drugs available, Suction available, Patient being monitored and Timeout performed Patient Re-evaluated:Patient Re-evaluated prior to induction Oxygen Delivery Method: Circle system utilized Preoxygenation: Pre-oxygenation with 100% oxygen Induction Type: IV induction Ventilation: Mask ventilation without difficulty Laryngoscope Size: Glidescope and 4 Grade View: Grade I Tube type: Oral Tube size: 7.0 mm Number of attempts: 1 Airway Equipment and Method: Stylet Placement Confirmation: ETT inserted through vocal cords under direct vision,  positive ETCO2 and breath sounds checked- equal and bilateral Secured at: 21 cm Tube secured with: Tape Dental Injury: Teeth and Oropharynx as per pre-operative assessment

## 2018-04-18 NOTE — Anesthesia Postprocedure Evaluation (Signed)
Anesthesia Post Note  Patient: Marcia [name]  Procedure(s) Performed: ANTERIOR CERVICAL DECOMPRESSION FUSION. CERVICAL 4-5, CERVICAL 5-6, CERVICAL 6-7 WITH INSTRUMENTATION AND ALLOGRAFT. (N/A )     Patient location during evaluation: PACU Anesthesia Type: General Level of consciousness: awake and alert Pain management: pain level controlled Vital Signs Assessment: post-procedure vital signs reviewed and stable Respiratory status: spontaneous breathing, nonlabored ventilation, respiratory function stable and patient connected to nasal cannula oxygen Cardiovascular status: blood pressure returned to baseline and stable Postop Assessment: no apparent nausea or vomiting Anesthetic complications: no    Last Vitals:  Vitals:   04/18/18 1642 04/18/18 2040  BP: (!) 144/77 129/67  Pulse: 77 77  Resp: 16 20  Temp: 36.7 C 36.9 C  SpO2: 93% 96%    Last Pain:  Vitals:   04/18/18 2040  TempSrc: Oral  PainSc:                  Bronsen Serano P Tanysha Quant

## 2018-04-19 DIAGNOSIS — M4802 Spinal stenosis, cervical region: Secondary | ICD-10-CM | POA: Diagnosis not present

## 2018-04-19 LAB — GLUCOSE, CAPILLARY: Glucose-Capillary: 87 mg/dL (ref 70–99)

## 2018-04-19 MED FILL — Thrombin (Recombinant) For Soln 20000 Unit: CUTANEOUS | Qty: 1 | Status: AC

## 2018-04-19 NOTE — Progress Notes (Signed)
    Patient doing well  Denies arm pain Tolerating PO well   Physical Exam: Vitals:   04/18/18 2337 04/19/18 0308  BP: 128/61 (!) 118/57  Pulse: 71 75  Resp: 16 18  Temp: 98.5 F (36.9 C) 99.1 F (37.3 C)  SpO2: 94% 94%    Neck soft/supple Dressing in place NVI  POD #1 s/p ACDF, doing well  - encourage ambulation - Percocet for pain, Valium for muscle spasms - d/c home today with f/u in 2 weeks

## 2018-04-19 NOTE — Progress Notes (Signed)
Patient alert and oriented, mae's well, voiding adequate amount of urine, swallowing without difficulty, no c/o pain at time of discharge. Patient discharged home with family. Script and discharged instructions given to patient. Patient and family stated understanding of instructions given. Patient has an appointment with Dr. Dumonski 

## 2018-04-23 ENCOUNTER — Encounter (HOSPITAL_COMMUNITY): Payer: Self-pay | Admitting: Orthopedic Surgery

## 2018-05-08 NOTE — Discharge Summary (Signed)
Patient ID: Marcia [name] MRN: [id number] DOB/AGE: 1970/12/31 47 y.o.  Admit date: 04/18/2018 Discharge date: 04/19/2018  Admission Diagnoses:  Active Problems:   Radiculopathy   Discharge Diagnoses:  Same  Past Medical History:  Diagnosis Date  . Diabetes mellitus   . Hay fever    Allergies  . Headache    only from neck pain  . Heart murmur    at birth   . History of HPV infection   . Hypercholesterolemia   . Hypothyroidism   . Melanoma of skin ([place]) 2010   In her vaginal area with surgical resection.   . Myocardial infarction ([place]) 1997   At age 61  . Ovarian cyst 05/09/2017  . Radiculopathy of cervical region    B/L  . Thyroid disease   . Tobacco abuse   . Vulvar lesion   . Wears glasses     Surgeries: Procedure(s): ANTERIOR CERVICAL DECOMPRESSION FUSION. CERVICAL 4-5, CERVICAL 5-6, CERVICAL 6-7 WITH INSTRUMENTATION AND ALLOGRAFT. on 04/18/2018   Consultants: None  Discharged Condition: Improved  Hospital Course: Marcia [name] is an 47 y.o. female who was admitted 04/18/2018 for operative treatment of radiculopathy. Patient has severe unremitting pain that affects sleep, daily activities, and work/hobbies. After pre-op clearance the patient was taken to the operating room on 04/18/2018 and underwent  Procedure(s): ANTERIOR CERVICAL DECOMPRESSION FUSION. CERVICAL 4-5, CERVICAL 5-6, CERVICAL 6-7 WITH INSTRUMENTATION AND ALLOGRAFT.Marland Kitchen    Patient was given perioperative antibiotics:  Anti-infectives (From admission, onward)   Start     Dose/Rate Route Frequency Ordered Stop   04/18/18 1415  ceFAZolin (ANCEF) IVPB 2g/100 mL premix     2 g 200 mL/hr over 30 Minutes Intravenous Every 8 hours 04/18/18 1400 04/18/18 2200   04/18/18 0615  ceFAZolin (ANCEF) IVPB 2g/100 mL premix     2 g 200 mL/hr over 30 Minutes Intravenous On call to O.R. 04/18/18 7619 04/18/18 0754   04/18/18 0614  ceFAZolin (ANCEF) 2-4 GM/100ML-% IVPB    Note to Pharmacy:  Maryjean Ka    : cabinet override      04/18/18 0614 04/18/18 0754       Patient was given sequential compression devices, early ambulation to prevent DVT.  Patient benefited maximally from hospital stay and there were no complications.    Recent vital signs: BP 120/69 (BP Location: Right Arm)   Pulse 84   Temp 98.3 F (36.8 C) (Oral)   Resp 16   LMP 10/12/2012   SpO2 95%    Discharge Medications:   Allergies as of 04/19/2018      Reactions   Chantix [varenicline] Rash   Septra [sulfamethoxazole-trimethoprim] Rash      Medication List    TAKE these medications   ACCU-CHEK AVIVA PLUS test strip Generic drug:  glucose blood TEST 2 TIMES A DAY   ACCU-CHEK AVIVA PLUS w/Device Kit 1 Device by Does not apply route once.   aspirin EC 81 MG tablet Take 81 mg by mouth daily.   atorvastatin 40 MG tablet Commonly known as:  LIPITOR Take 40 mg by mouth at bedtime.   B-D ULTRAFINE III SHORT PEN 31G X 8 MM Misc Generic drug:  Insulin Pen Needle USE TWO NEEDLES DAILY   BRISDELLE 7.5 MG Caps Generic drug:  PARoxetine Mesylate Take 7.5 mg by mouth daily before breakfast.   clobetasol ointment 0.05 % Commonly known as:  TEMOVATE Apply 1 application topically every other day. At night   ergocalciferol 50000 units capsule  Commonly known as:  VITAMIN D2 Take 50,000 Units by mouth every Saturday.   estradiol 0.05 MG/24HR patch Commonly known as:  VIVELLE-DOT Place 1 patch (0.05 mg total) 2 (two) times a week onto the skin.   gabapentin 300 MG capsule Commonly known as:  NEURONTIN Take 300 mg by mouth 3 (three) times daily.   insulin aspart 100 UNIT/ML FlexPen Commonly known as:  NOVOLOG 4 units with breakfast, and 4 units with the evening meal. What changed:    how much to take  how to take this  when to take this  additional instructions   INVOKANA 300 MG Tabs tablet Generic drug:  canagliflozin Take 300 mg by mouth daily before breakfast.   levothyroxine 75 MCG  tablet Commonly known as:  SYNTHROID, LEVOTHROID Take 75 mcg by mouth daily before breakfast.   TRESIBA FLEXTOUCH 200 UNIT/ML Sopn Generic drug:  Insulin Degludec Inject 46 Units into the skin at bedtime.       Diagnostic Studies: Dg Cervical Spine 1 View  Result Date: 04/18/2018 CLINICAL DATA:  C4-C7 ACDF EXAM: DG C-ARM 61-120 MIN; DG CERVICAL SPINE - 1 VIEW COMPARISON:  11/29/2016 FINDINGS: Changes of ACDF from C4-C7. Normal alignment. No hardware complicating feature. IMPRESSION: ACDF C4-C7 with no visible complicating feature. Electronically Signed   By: Rolm Baptise M.D.   On: 04/18/2018 11:03   Dg C-arm 1-60 Min  Result Date: 04/18/2018 CLINICAL DATA:  C4-C7 ACDF EXAM: DG C-ARM 61-120 MIN; DG CERVICAL SPINE - 1 VIEW COMPARISON:  11/29/2016 FINDINGS: Changes of ACDF from C4-C7. Normal alignment. No hardware complicating feature. IMPRESSION: ACDF C4-C7 with no visible complicating feature. Electronically Signed   By: Rolm Baptise M.D.   On: 04/18/2018 11:03    Disposition:    POD #1 s/p ACDF, doing well  - encourage ambulation - Percocet for pain, Valium for muscle spasms -Written scripts for pain signed and in chart -D/C instructions sheet printed and in chart -D/C today  -F/U in office 2 weeks   Signed: Justice Britain 05/08/2018, 1:57 PM

## 2018-05-15 ENCOUNTER — Ambulatory Visit: Payer: BLUE CROSS/BLUE SHIELD

## 2018-08-14 NOTE — Progress Notes (Signed)
[facility] Consultation      Assessment and Plan:  Intractable cough with acute respiratory disorder-resolved. -Cough which has been present for about 2 years, not responding to empiric therapy. S/p bronchoscopy which grew aspergillus.  --Started on voriconazole, now complete.  - Her cough is now resolved.  Lung nodule. --Tiny right lung nodule, 4m or less on 05/14/17.  -Repeat CT chest in 6 months was not performed as patient was having neck surgery at that time.  Discussed today will need to repeat CT chest and follow-up.  Dysphagia. -Patient describes episodes of choking eating or drinking, which occur daily. She has had a negative swallow evaluation. -Could consider an atypical presentation of vocal cord dysfunction which could be causing her problems.  Nicotine abuse. -Currently smoking about half pack a day and considering quitting.  Has set a quit date for February 1.  Preoperative pulmonary examination. - Moderate risk of complications with surgery, these were discussed today.  She can reduce these risk by not smoking for the procedure and by not having a procedure done if she is experiencing a chest cold.  Orders Placed This Encounter  Procedures  . CT CHEST WO CONTRAST   Return in about 3 months (around 11/14/2018) for post ct followup. .   Date: 08/14/2018  MRN# [id number] DRAY508/29/1972   Marcia ABADis a 48y.o. old female seen in consultation for chief complaint of:    Chief Complaint  Patient presents with  . Follow-up    breathing going well, needs surgical clearance     HPI:  The patient is a 48year old female with persistent cough, seen by ENT status post negative scope.  Negative allergy testing and an allergist assessment.  She is been tried on multiple inhalers including Spiriva, Advair, Ventolin, Proventil, Atrovent which were apparently not helpful.  Swallow study was normal.  Her cough has not responded to  usual treatment such as Nexium, Singulair, Xyzal, Tessalon, cough syrup, nasal spray, cough drops, prednisone.  She has undergone a bronchoscopy which grew aspergillus. Afumigatus IgG negative. She was stared on voriconazole, for one month and has 2 days left. The cough is the same. She down to smoking to 3 cigs per day and is planning to stop by the end of next month.    Today the patient is here for surgical clearance, she is planned to undergo right shoulder arthroscopy with rotator cuff debridement distal clavicle resection with possible biceps tenotomy. Since her last visit she feels that her breathing has been doing well, she denies dyspnea. She has been walking on treadmill daily for 3 miles at moderate pace. She does not have dyspnea during these times, she is not using any inhalers.  She had neck surgery fusion about 4 months ago and had no complications, no respiratory issues with that surgery.  She is no longer coughing.  She is smoking about half ppd, and has resolved to quit on Feb 1st. No plan to using medications she developed a rash with chantix.   **Spirometry 04/12/2017; FVC is 94% predicted, FEV1 is 84% predicted, ratio is 73%. No bronchodilator was given. Overall test is normal Spirometry 11/24/2013; FVC 92%, FEV1 94%, ratio 79%, no significant changes with bronchodilator. **ABG 11/24/2013; 7.4/36/69/22.3 on room air; normal **CBC 05/19/2016; Eos = 0.  Social Hx:   Social History   Tobacco Use  . Smoking status: Current Every Day Smoker    Packs/day: 0.25    Years: 22.00  Pack years: 5.50    Types: Cigarettes    Last attempt to quit: 03/07/2018    Years since quitting: 0.4  . Smokeless tobacco: Never Used  . Tobacco comment: 4 cigarettes per day  Substance Use Topics  . Alcohol use: Yes    Alcohol/week: 0.0 standard drinks    Comment: occasional beer  . Drug use: No   Medication:    Current Outpatient Medications:  .  ACCU-CHEK AVIVA PLUS test strip, TEST 2  TIMES A DAY (Patient not taking: Reported on 04/11/2018), Disp: 100 each, Rfl: 6 .  aspirin EC 81 MG tablet, Take 81 mg by mouth daily., Disp: , Rfl:  .  atorvastatin (LIPITOR) 40 MG tablet, Take 40 mg by mouth at bedtime. , Disp: , Rfl:  .  B-D ULTRAFINE III SHORT PEN 31G X 8 MM MISC, USE TWO NEEDLES DAILY (Patient not taking: Reported on 04/11/2018), Disp: 100 each, Rfl: 8 .  Blood Glucose Monitoring Suppl (ACCU-CHEK AVIVA PLUS) W/DEVICE KIT, 1 Device by Does not apply route once. (Patient not taking: Reported on 04/11/2018), Disp: 1 kit, Rfl: 0 .  canagliflozin (INVOKANA) 300 MG TABS tablet, Take 300 mg by mouth daily before breakfast., Disp: , Rfl:  .  clobetasol ointment (TEMOVATE) 3.82 %, Apply 1 application topically every other day. At night (Patient not taking: Reported on 04/11/2018), Disp: 60 g, Rfl: 2 .  ergocalciferol (VITAMIN D2) 50000 units capsule, Take 50,000 Units by mouth every Saturday. , Disp: , Rfl:  .  estradiol (MINIVELLE) 0.05 MG/24HR patch, Place 1 patch (0.05 mg total) 2 (two) times a week onto the skin. (Patient not taking: Reported on 04/11/2018), Disp: 8 patch, Rfl: 12 .  gabapentin (NEURONTIN) 300 MG capsule, Take 300 mg by mouth 3 (three) times daily., Disp: , Rfl:  .  insulin aspart (NOVOLOG FLEXPEN) 100 UNIT/ML FlexPen, 4 units with breakfast, and 4 units with the evening meal. (Patient taking differently: Inject 2-12 Units into the skin 3 (three) times daily with meals. Per sliding scale), Disp: 15 mL, Rfl: 11 .  Insulin Degludec (TRESIBA FLEXTOUCH) 200 UNIT/ML SOPN, Inject 46 Units into the skin at bedtime., Disp: , Rfl:  .  levothyroxine (SYNTHROID, LEVOTHROID) 75 MCG tablet, Take 75 mcg by mouth daily before breakfast., Disp: , Rfl:  .  PARoxetine Mesylate (BRISDELLE) 7.5 MG CAPS, Take 7.5 mg by mouth daily before breakfast. , Disp: , Rfl:  No current facility-administered medications for this visit.   Facility-Administered Medications Ordered in Other Visits:  .   lidocaine (XYLOCAINE) 2 % jelly 1 application, 1 application, Topical, QID PRN, [place], Venida Jarvis, MD   Allergies:  Chantix [varenicline] and Septra [sulfamethoxazole-trimethoprim]      LABORATORY PANEL:   CBC No results for input(s): WBC, HGB, HCT, PLT in the last 168 hours. ------------------------------------------------------------------------------------------------------------------  Chemistries  No results for input(s): NA, K, CL, CO2, GLUCOSE, BUN, CREATININE, CALCIUM, MG, AST, ALT, ALKPHOS, BILITOT in the last 168 hours.  Invalid input(s): GFRCGP ------------------------------------------------------------------------------------------------------------------  Cardiac Enzymes No results for input(s): TROPONINI in the last 168 hours. ------------------------------------------------------------  RADIOLOGY:  No results found.     Thank  you for the consultation and for allowing [facility] to assist in the care of your patient. Our recommendations are noted above.  Please contact us if we can be of further service.  Marda Stalker, M.D., F.C.C.P.  Board Certified in Internal Medicine, Pulmonary Medicine, [place], and Sleep Medicine.  [facility]  Office Number: [phone]   08/14/2018

## 2018-08-15 ENCOUNTER — Encounter: Payer: Self-pay | Admitting: Internal Medicine

## 2018-08-15 ENCOUNTER — Ambulatory Visit (INDEPENDENT_AMBULATORY_CARE_PROVIDER_SITE_OTHER): Payer: BLUE CROSS/BLUE SHIELD | Admitting: Internal Medicine

## 2018-08-15 VITALS — BP 122/78 | HR 86 | Ht 62.0 in | Wt 157.2 lb

## 2018-08-15 DIAGNOSIS — Z01811 Encounter for preprocedural respiratory examination: Secondary | ICD-10-CM

## 2018-08-15 DIAGNOSIS — R911 Solitary pulmonary nodule: Secondary | ICD-10-CM

## 2018-08-15 DIAGNOSIS — R918 Other nonspecific abnormal finding of lung field: Secondary | ICD-10-CM | POA: Diagnosis not present

## 2018-08-15 DIAGNOSIS — Z720 Tobacco use: Secondary | ICD-10-CM

## 2018-08-15 DIAGNOSIS — F17210 Nicotine dependence, cigarettes, uncomplicated: Secondary | ICD-10-CM | POA: Diagnosis not present

## 2018-08-15 NOTE — Patient Instructions (Signed)
Moderate risk of complications with surgery, he can reduce his risk by quitting smoking before the procedure, and also postponing the procedure if you are experiencing a chest cold at that time. He will need a repeat CT chest in about 3 months, follow-up after that.

## 2018-08-22 ENCOUNTER — Other Ambulatory Visit: Payer: Self-pay | Admitting: Nurse Practitioner

## 2018-08-22 DIAGNOSIS — Z1231 Encounter for screening mammogram for malignant neoplasm of breast: Secondary | ICD-10-CM

## 2018-09-03 DIAGNOSIS — M25511 Pain in right shoulder: Secondary | ICD-10-CM | POA: Insufficient documentation

## 2018-09-07 HISTORY — PX: SHOULDER SURGERY: SHX246

## 2018-09-25 ENCOUNTER — Inpatient Hospital Stay: Payer: BLUE CROSS/BLUE SHIELD

## 2018-11-27 ENCOUNTER — Inpatient Hospital Stay: Payer: BLUE CROSS/BLUE SHIELD

## 2018-12-03 ENCOUNTER — Other Ambulatory Visit: Payer: Self-pay

## 2018-12-04 ENCOUNTER — Encounter: Payer: Self-pay | Admitting: Obstetrics and Gynecology

## 2018-12-04 ENCOUNTER — Inpatient Hospital Stay: Payer: BLUE CROSS/BLUE SHIELD | Attending: Obstetrics and Gynecology | Admitting: Obstetrics and Gynecology

## 2018-12-04 DIAGNOSIS — D071 Carcinoma in situ of vulva: Secondary | ICD-10-CM | POA: Diagnosis not present

## 2018-12-04 DIAGNOSIS — F17210 Nicotine dependence, cigarettes, uncomplicated: Secondary | ICD-10-CM | POA: Diagnosis not present

## 2018-12-04 NOTE — Progress Notes (Signed)
Gynecologic Oncology Interval Note Virtual Visit via Telephone Note   Referring Provider: Dr. 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    I connected with Marcia [name] on 12/04/2018 at 9:00 AM EST by video enabled telemedicine visit and verified that I am speaking with the correct person using two identifiers.   I discussed the limitations, risks, security and privacy concerns of performing an evaluation and management service by telemedicine and the availability of in-person appointments. I also discussed with the patient that there may be a patient responsible charge related to this service. The patient expressed understanding and agreed to proceed.   Subjective:  Marcia [name] is a 48 y.o. female, diagnosed with VIN III, who returns to clinic today for continued management.  She was seen in gyn-onc clinic by Dr. Fransisca Connors on 11/14/17. She complained of some vulvar itching at that time. Vulvar biopsy performed at 9:00 and diflucan was sent for possible yeast infection.   Pathology:  A. RIGHT VULVA; BIOPSY:  - HIGH-GRADE SQUAMOUS INTRAEPITHELIAL LESION (VIN III).  - A PERIPHERAL BIOPSY EDGE IS INVOLVED.   8/19 normal exam and asymptomatic. No new complaints today.  No itching, burning or discharge.  No lesions noted.  Treatment History:  Prior hysterectomy   08/2009     VIN II                 laser vaporization 03/2010     VIN II                 Aldara started,well tolerated with reduced frequency, 04/2011     infection peri-clitoral area, responding to antibiotic therapy 06/2012   recurrent VIN III on biopsy of left labia minora,  PAP negative. 1/14     laser vaporization of raised epithelium consistent with VIN, mainly on the anterior portion of the labia minora. A few other scattered areas around the external genitalia.  04/07/15:  Had laser ablation in OR with Dr Theora Gianotti.  "Acetowhite epithelium involving bilateral labia minora of the vulva each approximately 3 cm. On the left the abnormalities extending to the medial aspect of the labia minora. There was a another patch of acetowhite on right vulva at 3 o'clock as well as another area at the perineum. There were scattered small 1-2 mm areas of acetowhite extending toward the anus."  02/14/17   Vulvar biopsies, right VIN2 grossly removed, left keratosis. DIAGNOSIS: VULVA, RIGHT ANTERIOR; BIOPSY:  - KERATOSIS AND MILD SQUAMOUS DYSPLASIA.  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  - A PERIPHERAL EDGE IS INVOLVED.   Seen in clinic on 04/11/17.  At that time, she had a new 47m wart near introitus at 7:00 which was removed.  Wet prep positive for yeast.  Negative for trichomoniasis and clue cells.    Pathology  04/11/2017:  DIAGNOSIS:  A. VULVA, RIGHT; BIOPSY/REMOVAL:  - LOW-GRADE SQUAMOUS INTRAEPITHELIAL LESION (CONDYLOMA).  - CANDIDIASIS.  - NEGATIVE FOR HIGH-GRADE SQUAMOUS INTRAEPITHELIAL LESION AND MALIGNANCY.  Comment: There are focal intraepithelial neutrophils, and PAS stain for fungi demonstrates a few intraepithelial pseudohyphae.   She was treated with Diflucan 150 mg daily X 3 days and used clobetasol topically  for persistent symptoms.   She had her left ovary removed 05/2017 with Dr. Kenton Kingfisher d/t cyst and pain.    GYN History:   Gravida 3    Para 2    Age at Menarche 8    Regular Pap Smears Yes    History of HPV Infection    Cycle Normal       Additional Hx no other gyn problems in the past   Problem List: Patient Active Problem List   Diagnosis Date Noted  . Radiculopathy 04/18/2018  . Left lower  quadrant pain 05/22/2017  . Ovarian cyst, complex 05/11/2017  . Leukocytosis 05/19/2016  . Erythrocytosis 05/19/2016  . Neck pain on left side 05/19/2016  . Vulvar intraepithelial neoplasia (VIN) grade 3 03/10/2015  . 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 Staphylococcal infection of skin 10/13/2014  . Furuncle of vulva 10/13/2014  . Hyperlipidemia due to type 1 diabetes mellitus ([place]) 09/12/2014  . Encounter for preventive health examination 09/12/2014  . Onychomycosis of toenail 09/10/2014  . Type 1 diabetes mellitus with neurological manifestations, uncontrolled ([place]) 02/10/2014  . Cough 02/05/2014  . Carpal tunnel syndrome 12/17/2013  . Chronic bronchitis ([place]) 11/27/2013  . Unspecified constipation 11/11/2013  . Left wrist pain 11/07/2013  . Insomnia secondary to anxiety 07/09/2013  . History of hyperthyroidism 07/01/2013  . Degenerative TFCC tear 05/23/2013  . Hyperthyroidism 04/28/2013  . Right wrist pain 04/28/2013  . Type I diabetes mellitus ([place]) 04/28/2013  . Snoring disorder 12/26/2012  . Tobacco abuse 05/22/2011  . Tobacco abuse counseling 05/22/2011  . ABNORMAL EKG 09/10/2009  . Dyslipidemia 04/02/2006    Past Medical History: Past Medical History:  Diagnosis Date  . Diabetes mellitus   . Hay fever    Allergies  . Headache    only from neck pain  . Heart murmur    at birth   . History of HPV infection   . Hypercholesterolemia   . Hypothyroidism   . Melanoma of skin ([place]) 2010   In her vaginal area with surgical resection.   . Myocardial infarction ([place]) 1997   At age 58  . Ovarian cyst 05/09/2017  . Radiculopathy of cervical region    B/L  . Thyroid disease   . Tobacco abuse   . Vulvar lesion   . Wears glasses     Past Surgical History: Past Surgical History:  Procedure Laterality Date  . ABDOMINAL HYSTERECTOMY  10/12/12  . ANKLE SURGERY Right 2010   Fractued ankle  . ANTERIOR CERVICAL DECOMPRESSION/DISCECTOMY FUSION 4 LEVELS N/A  04/18/2018   Procedure: ANTERIOR CERVICAL DECOMPRESSION FUSION. CERVICAL 4-5, CERVICAL 5-6, CERVICAL 6-7 WITH INSTRUMENTATION AND ALLOGRAFT.;  Surgeon: Phylliss Bob, MD;  Location: [facility];  Service: Orthopedics;  Laterality: N/A;  . BREAST BIOPSY Bilateral @ 2013   core with clips  . FLEXIBLE BRONCHOSCOPY N/A 05/30/2017   Procedure: FLEXIBLE BRONCHOSCOPY;  Surgeon: Laverle Hobby, MD;  Location: [facility];  Service: Pulmonary;  Laterality: N/A;  . LAPAROSCOPIC SALPINGO OOPHERECTOMY Left 06/14/2017   Procedure: LAPAROSCOPIC SALPINGO OOPHORECTOMY;  Surgeon: Gae Dry, MD;  Location: [facility];  Service: Gynecology;  Laterality: Left;  . SHOULDER SURGERY Right 09/2018  . TUBAL LIGATION  2004  . TUBAL LIGATION  2004, [facility]  . VULVAR LESION REMOVAL  10/2009 & 05/2012  . VULVAR LESION REMOVAL N/A 04/07/2015   Procedure: VULVAR LESION;  Surgeon: Gillis Ends, MD;  Location: [facility];  Service: Gynecology;  Laterality: N/A;  with J-Plasma blade  Family History: Family History  Problem Relation Age of Onset  . Diabetes Father   . Hypertension Mother   . Hyperlipidemia Mother   . Diabetes Mother   . Aneurysm Brother   . Breast cancer Maternal Aunt 40       lung and ovarian   Social History: Social History   Socioeconomic History  . Marital status: Married    Spouse name: Not on file  . Number of children: Not on file  . Years of education: Not on file  . Highest education level: Not on file  Occupational History  . Not on file  Social Needs  . Financial resource strain: Not on file  . Food insecurity:    Worry: Not on file    Inability: Not on file  . Transportation needs:    Medical: Not on file    Non-medical: Not on file  Tobacco Use  . Smoking status: Current Every Day Smoker    Packs/day: 0.50    Years: 22.00    Pack years: 11.00    Types: Cigarettes    Last attempt to quit: 03/07/2018    Years since quitting: 0.7  . Smokeless tobacco: Never Used   Substance and Sexual Activity  . Alcohol use: Yes    Alcohol/week: 0.0 standard drinks    Comment: occasional beer  . Drug use: No  . Sexual activity: Yes    Birth control/protection: Surgical  Lifestyle  . Physical activity:    Days per week: Not on file    Minutes per session: Not on file  . Stress: Not on file  Relationships  . Social connections:    Talks on phone: Not on file    Gets together: Not on file    Attends religious service: Not on file    Active member of club or organization: Not on file    Attends meetings of clubs or organizations: Not on file    Relationship status: Not on file  . Intimate partner violence:    Fear of current or ex partner: Not on file    Emotionally abused: Not on file    Physically abused: Not on file    Forced sexual activity: Not on file  Other Topics Concern  . Not on file  Social History Narrative   No regular exercise.   Lives with spouse.    Allergies: Allergies  Allergen Reactions  . Chantix [Varenicline] Rash  . Septra [Sulfamethoxazole-Trimethoprim] Rash    Current Medications: Current Outpatient Medications  Medication Sig Dispense Refill  . ACCU-CHEK AVIVA PLUS test strip TEST 2 TIMES A DAY 100 each 6  . aspirin EC 81 MG tablet Take 81 mg by mouth daily.    Marland Kitchen atorvastatin (LIPITOR) 40 MG tablet Take 40 mg by mouth at bedtime.     . B-D ULTRAFINE III SHORT PEN 31G X 8 MM MISC USE TWO NEEDLES DAILY 100 each 8  . Blood Glucose Monitoring Suppl (ACCU-CHEK AVIVA PLUS) W/DEVICE KIT 1 Device by Does not apply route once. 1 kit 0  . canagliflozin (INVOKANA) 300 MG TABS tablet Take 300 mg by mouth daily before breakfast.    . clobetasol ointment (TEMOVATE) 6.76 % Apply 1 application topically every other day. At night (Patient taking differently: Apply 1 application topically every other day. At night. Takes as needed) 60 g 2  . ergocalciferol (VITAMIN D2) 50000 units capsule Take 50,000 Units by mouth every Saturday.     .  insulin aspart (NOVOLOG  FLEXPEN) 100 UNIT/ML FlexPen 4 units with breakfast, and 4 units with the evening meal. (Patient taking differently: Inject 2-12 Units into the skin 3 (three) times daily with meals. Per sliding scale) 15 mL 11  . Insulin Degludec (TRESIBA FLEXTOUCH) 200 UNIT/ML SOPN Inject 46 Units into the skin at bedtime.    Marland Kitchen levothyroxine (SYNTHROID, LEVOTHROID) 75 MCG tablet Take 75 mcg by mouth daily before breakfast.    . PARoxetine Mesylate (BRISDELLE) 7.5 MG CAPS Take 7.5 mg by mouth daily before breakfast.      No current facility-administered medications for this visit.    Facility-Administered Medications Ordered in Other Visits  Medication Dose Route Frequency Provider Last Rate Last Dose  . lidocaine (XYLOCAINE) 2 % jelly 1 application  1 application Topical QID PRN Gillis Ends, MD        Review of Systems General:  no complaints, no fever Skin: no complaints Eyes: no complaints HEENT: no complaints Breasts: no complaints Pulmonary: no complaints, no cough dyspnea.  Cardiac: no complaints Gastrointestinal: no complaints Genitourinary/Sexual: no complaints Ob/Gyn: no complaints Musculoskeletal: no complaints Hematology: no complaints Neurologic/Psych: no complaints   Objective:   Physical Examination:   ECOG Performance Status: 0 asymptomatic  Exam not performed due to televideo visit.  Assessment:  Marcia [name] is a 48 y.o. female with a history of vulvar dysplasia VINIII s/p laser 04/07/15. Vulvar lesion biopsied 7/18 [facility].  Vulvar lesion biopsied off 4/19 VINIII and positive margin but no evidence of VIN on exam 8/19. Fortunately, she remains asymptomatic.   No symptoms concerning for COVID infection. She is appropriately sheltering in place and husband working but wearing mask.  Plan:   Problem List Items Addressed This Visit    None    Visit Diagnoses    VIN III (vulvar intraepithelial neoplasia III)    -  Primary        Again, discussed importance of tobacco cessation in controlling VIN.  She will continue to shelter in place and we will see her in about 3 months for exam.  She can return sooner if any new symptoms.     I discussed the assessment and treatment plan with the patient. The patient was provided an opportunity to ask questions and all were answered. The patient agreed with the plan and demonstrated an understanding of the instructions.   The patient was advised to call back or seek an in-person evaluation if the symptoms worsen or if the condition fails to improve as anticipated.   I provided 15 minutes of face-to-face video visit time, during this encounter, and > 50% was spent counseling as documented under my assessment & plan.     Verlon Au, NP  CC:  Danelle Berry, NP [address] [place], [place] [address] [phone]   I personally interviewed and examined the patient. Agreed with the above/below plan of care. Patient/family questions were answered.  Mellody Drown, MD

## 2019-01-27 ENCOUNTER — Other Ambulatory Visit: Payer: Self-pay

## 2019-01-27 ENCOUNTER — Ambulatory Visit
Admission: RE | Admit: 2019-01-27 | Discharge: 2019-01-27 | Disposition: A | Discharge status: Home / Self Care | Payer: BLUE CROSS/BLUE SHIELD | Source: Ambulatory Visit | Attending: Nurse Practitioner | Admitting: Nurse Practitioner

## 2019-01-27 DIAGNOSIS — Z1231 Encounter for screening mammogram for malignant neoplasm of breast: Secondary | ICD-10-CM | POA: Insufficient documentation

## 2019-01-28 ENCOUNTER — Other Ambulatory Visit: Payer: Self-pay | Admitting: Nurse Practitioner

## 2019-02-26 ENCOUNTER — Inpatient Hospital Stay: Payer: BLUE CROSS/BLUE SHIELD | Attending: Obstetrics and Gynecology | Admitting: Obstetrics and Gynecology

## 2019-02-26 ENCOUNTER — Other Ambulatory Visit: Payer: Self-pay

## 2019-02-26 VITALS — BP 116/79 | HR 94 | Temp 98.7°F | Wt 158.9 lb

## 2019-02-26 DIAGNOSIS — E785 Hyperlipidemia, unspecified: Secondary | ICD-10-CM | POA: Diagnosis not present

## 2019-02-26 DIAGNOSIS — D071 Carcinoma in situ of vulva: Secondary | ICD-10-CM | POA: Insufficient documentation

## 2019-02-26 DIAGNOSIS — Z79899 Other long term (current) drug therapy: Secondary | ICD-10-CM | POA: Diagnosis not present

## 2019-02-26 DIAGNOSIS — E1149 Type 2 diabetes mellitus with other diabetic neurological complication: Secondary | ICD-10-CM | POA: Diagnosis not present

## 2019-02-26 DIAGNOSIS — F17210 Nicotine dependence, cigarettes, uncomplicated: Secondary | ICD-10-CM | POA: Diagnosis not present

## 2019-02-26 DIAGNOSIS — A630 Anogenital (venereal) warts: Secondary | ICD-10-CM | POA: Diagnosis not present

## 2019-02-26 DIAGNOSIS — Z7982 Long term (current) use of aspirin: Secondary | ICD-10-CM | POA: Diagnosis not present

## 2019-02-26 DIAGNOSIS — E039 Hypothyroidism, unspecified: Secondary | ICD-10-CM | POA: Insufficient documentation

## 2019-02-26 DIAGNOSIS — Z90710 Acquired absence of both cervix and uterus: Secondary | ICD-10-CM | POA: Diagnosis not present

## 2019-02-26 DIAGNOSIS — Z794 Long term (current) use of insulin: Secondary | ICD-10-CM | POA: Insufficient documentation

## 2019-02-26 DIAGNOSIS — F419 Anxiety disorder, unspecified: Secondary | ICD-10-CM | POA: Diagnosis not present

## 2019-02-26 DIAGNOSIS — I252 Old myocardial infarction: Secondary | ICD-10-CM | POA: Diagnosis not present

## 2019-02-26 NOTE — Progress Notes (Signed)
Gynecologic Oncology Interval Note  Referring Provider: Dr. 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   She spoke with Dr. Fransisca Connors via telemedicine on 12/04/2018 due to covid 19 pandemic. She was asymptomatic at that time and presents today for follow up. Previously, she had had VIN III on 11/2017 biopsy with positive margins and short term active surveillance recommended since she had stopped smoking and it was anticipate the lesion would improve. She saw Dr. Fransisca Connors 3 months later and had a  negative exam.  She continues to have some intermittent itching which improves with clobetasol. She uses clobetsol roughly every 3-4 days. The itching never completely goes away. She had stopped smoking for 3 months and unfortunately restarted 5 months ago. Otherwise she feels well and denies specific complaints.    Treatment History:  Prior hysterectomy-     08/2009     VIN II                 laser vaporization 03/2010     VIN II                 Aldara started,well tolerated with reduced frequency, 04/2011     Infection peri-clitoral area, responding to antibiotic therapy 06/2012   recurrent VIN III on biopsy of left labia minora, PAP negative. 08/2012     laser vaporization of raised epithelium consistent with VIN, mainly on the anterior portion of the labia minora. A few other scattered areas around the external genitalia.  04/07/15:  Had laser ablation in OR with Dr Theora Gianotti.  "Acetowhite epithelium involving bilateral labia minora of the vulva each approximately 3 cm. On the left the abnormalities extending to the medial aspect of the labia minora. There was a another patch of acetowhite on right vulva at 3 o'clock as well as another area at the perineum. There were scattered small 1-2 mm areas of acetowhite  extending toward the anus."  02/14/17   Vulvar biopsies, right VIN2 grossly removed, left keratosis. DIAGNOSIS: VULVA, RIGHT ANTERIOR; BIOPSY:  - KERATOSIS AND MILD SQUAMOUS DYSPLASIA.  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  - A PERIPHERAL EDGE IS INVOLVED.   04/11/17- New 57m wart near introitus at 7:00 which was removed.  Wet prep positive for yeast.  Negative for trichomoniasis and clue cells.    Pathology  04/11/2017:  DIAGNOSIS:  A. VULVA, RIGHT; BIOPSY/REMOVAL:  - LOW-GRADE SQUAMOUS INTRAEPITHELIAL LESION (CONDYLOMA).  - CANDIDIASIS.  - NEGATIVE FOR HIGH-GRADE SQUAMOUS INTRAEPITHELIAL LESION AND MALIGNANCY.  Comment: There are focal intraepithelial neutrophils, and PAS stain for fungi demonstrates a few intraepithelial pseudohyphae.   She was treated with Diflucan 150 mg daily X 3 days and used clobetasol topically for persistent symptoms.   She had her left ovary removed 05/2017 with Dr. HKenton Kingfisherd/t cyst and pain.    11/14/17 she saw Dr. BFransisca Connors . She complained of some vulvar itching at that time. Vulvar biopsy performed at 9:00 and diflucan was sent for possible yeast infection.   Pathology:  A. RIGHT VULVA; BIOPSY:  - HIGH-GRADE SQUAMOUS INTRAEPITHELIAL LESION (VIN III).  - A PERIPHERAL BIOPSY EDGE IS INVOLVED.   03/2018- NED on exam with Dr. BFransisca Connors  GYN History:   Gravida 3    Para 2    Age at  Menarche 13    Regular Pap Smears Yes    History of HPV Infection    Cycle Normal       Additional Hx no other gyn problems in the past   Problem List: Patient Active Problem List   Diagnosis Date Noted  . Radiculopathy 04/18/2018  . Left lower quadrant pain 05/22/2017  . Ovarian cyst, complex 05/11/2017  . Leukocytosis 05/19/2016  . Erythrocytosis 05/19/2016  . Neck pain on left side 05/19/2016  . Vulvar intraepithelial neoplasia (VIN) grade 3 03/10/2015  . 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 Staphylococcal  infection of skin 10/13/2014  . Furuncle of vulva 10/13/2014  . Hyperlipidemia due to type 1 diabetes mellitus ([place]) 09/12/2014  . Encounter for preventive health examination 09/12/2014  . Onychomycosis of toenail 09/10/2014  . Type 1 diabetes mellitus with neurological manifestations, uncontrolled ([place]) 02/10/2014  . Cough 02/05/2014  . Carpal tunnel syndrome 12/17/2013  . Chronic bronchitis ([place]) 11/27/2013  . Unspecified constipation 11/11/2013  . Left wrist pain 11/07/2013  . Insomnia secondary to anxiety 07/09/2013  . History of hyperthyroidism 07/01/2013  . Degenerative TFCC tear 05/23/2013  . Hyperthyroidism 04/28/2013  . Right wrist pain 04/28/2013  . Type I diabetes mellitus ([place]) 04/28/2013  . Snoring disorder 12/26/2012  . Tobacco abuse 05/22/2011  . Tobacco abuse counseling 05/22/2011  . ABNORMAL EKG 09/10/2009  . Dyslipidemia 04/02/2006    Past Medical History: Past Medical History:  Diagnosis Date  . Diabetes mellitus   . Hay fever    Allergies  . Headache    only from neck pain  . Heart murmur    at birth   . History of HPV infection   . Hypercholesterolemia   . Hypothyroidism   . Melanoma of skin ([place]) 2010   In her vaginal area with surgical resection.   . Myocardial infarction ([place]) 1997   At age 69  . Ovarian cyst 05/09/2017  . Radiculopathy of cervical region    B/L  . Thyroid disease   . Tobacco abuse   . Vulvar lesion   . Wears glasses     Past Surgical History: Past Surgical History:  Procedure Laterality Date  . ABDOMINAL HYSTERECTOMY  10/12/12  . ANKLE SURGERY Right 2010   Fractued ankle  . ANTERIOR CERVICAL DECOMPRESSION/DISCECTOMY FUSION 4 LEVELS N/A 04/18/2018   Procedure: ANTERIOR CERVICAL DECOMPRESSION FUSION. CERVICAL 4-5, CERVICAL 5-6, CERVICAL 6-7 WITH INSTRUMENTATION AND ALLOGRAFT.;  Surgeon: Phylliss Bob, MD;  Location: [facility];  Service: Orthopedics;  Laterality: N/A;  . BREAST BIOPSY Bilateral @ 2013   core with clips  .  FLEXIBLE BRONCHOSCOPY N/A 05/30/2017   Procedure: FLEXIBLE BRONCHOSCOPY;  Surgeon: Laverle Hobby, MD;  Location: [facility];  Service: Pulmonary;  Laterality: N/A;  . LAPAROSCOPIC SALPINGO OOPHERECTOMY Left 06/14/2017   Procedure: LAPAROSCOPIC SALPINGO OOPHORECTOMY;  Surgeon: Gae Dry, MD;  Location: [facility];  Service: Gynecology;  Laterality: Left;  . SHOULDER SURGERY Right 09/2018  . TUBAL LIGATION  2004  . TUBAL LIGATION  2004, [facility]  . VULVAR LESION REMOVAL  10/2009 & 05/2012  . VULVAR LESION REMOVAL N/A 04/07/2015   Procedure: VULVAR LESION;  Surgeon: Gillis Ends, MD;  Location: [facility];  Service: Gynecology;  Laterality: N/A;  with J-Plasma blade   Family History: Family History  Problem Relation Age of Onset  . Diabetes Father   . Hypertension Mother   . Hyperlipidemia Mother   . Diabetes Mother   . Aneurysm Brother   . Breast  cancer Maternal Aunt 40       lung and ovarian   Social History: Social History   Socioeconomic History  . Marital status: Married    Spouse name: Not on file  . Number of children: Not on file  . Years of education: Not on file  . Highest education level: Not on file  Occupational History  . Not on file  Social Needs  . Financial resource strain: Not on file  . Food insecurity    Worry: Not on file    Inability: Not on file  . Transportation needs    Medical: Not on file    Non-medical: Not on file  Tobacco Use  . Smoking status: Current Every Day Smoker    Packs/day: 0.50    Years: 22.00    Pack years: 11.00    Types: Cigarettes    Last attempt to quit: 03/07/2018    Years since quitting: 0.9  . Smokeless tobacco: Never Used  Substance and Sexual Activity  . Alcohol use: Yes    Alcohol/week: 0.0 standard drinks    Comment: occasional beer  . Drug use: No  . Sexual activity: Yes    Birth control/protection: Surgical  Lifestyle  . Physical activity    Days per week: Not on file    Minutes per session: Not on  file  . Stress: Not on file  Relationships  . Social Herbalist on phone: Not on file    Gets together: Not on file    Attends religious service: Not on file    Active member of club or organization: Not on file    Attends meetings of clubs or organizations: Not on file    Relationship status: Not on file  . Intimate partner violence    Fear of current or ex partner: Not on file    Emotionally abused: Not on file    Physically abused: Not on file    Forced sexual activity: Not on file  Other Topics Concern  . Not on file  Social History Narrative   No regular exercise.   Lives with spouse.    Allergies: Allergies  Allergen Reactions  . Chantix [Varenicline] Rash  . Septra [Sulfamethoxazole-Trimethoprim] Rash    Current Medications: Current Outpatient Medications  Medication Sig Dispense Refill  . ACCU-CHEK AVIVA PLUS test strip TEST 2 TIMES A DAY 100 each 6  . aspirin EC 81 MG tablet Take 81 mg by mouth daily.    Marland Kitchen atorvastatin (LIPITOR) 40 MG tablet Take 40 mg by mouth at bedtime.     . B-D ULTRAFINE III SHORT PEN 31G X 8 MM MISC USE TWO NEEDLES DAILY 100 each 8  . Blood Glucose Monitoring Suppl (ACCU-CHEK AVIVA PLUS) W/DEVICE KIT 1 Device by Does not apply route once. 1 kit 0  . canagliflozin (INVOKANA) 300 MG TABS tablet Take 300 mg by mouth daily before breakfast.    . clobetasol ointment (TEMOVATE) 8.50 % APPLY 1 APPLICATION TOPICALLY EVERY OTHER DAY AT NIGHT 60 g 0  . ergocalciferol (VITAMIN D2) 50000 units capsule Take 50,000 Units by mouth every Saturday.     . insulin aspart (NOVOLOG FLEXPEN) 100 UNIT/ML FlexPen 4 units with breakfast, and 4 units with the evening meal. (Patient taking differently: Inject 2-12 Units into the skin 3 (three) times daily with meals. Per sliding scale) 15 mL 11  . Insulin Degludec (TRESIBA FLEXTOUCH) 200 UNIT/ML SOPN Inject 46 Units into the skin at bedtime.    Marland Kitchen  levothyroxine (SYNTHROID, LEVOTHROID) 75 MCG tablet Take 75 mcg  by mouth daily before breakfast.    . PARoxetine Mesylate (BRISDELLE) 7.5 MG CAPS Take 7.5 mg by mouth daily before breakfast.      No current facility-administered medications for this visit.    Facility-Administered Medications Ordered in Other Visits  Medication Dose Route Frequency Provider Last Rate Last Dose  . lidocaine (XYLOCAINE) 2 % jelly 1 application  1 application Topical QID PRN Gillis Ends, MD       Review of Systems General:  no complaints Skin: no complaints Eyes: no complaints HEENT: no complaints Breasts: no complaints Pulmonary: no complaints Cardiac: no complaints Gastrointestinal: no complaints Genitourinary/Sexual: no complaints Ob/Gyn: itching per HPI Musculoskeletal: no complaints Hematology: no complaints Neurologic/Psych: no complaints   Objective:  Physical Examination:  Today's Vitals   02/26/19 1036  BP: 116/79  Pulse: 94  Temp: 98.7 F (37.1 C)  TempSrc: Tympanic  Weight: 158 lb 14.4 oz (72.1 kg)  PainSc: 0-No pain   Body mass index is 29.06 kg/m. LMP 10/12/2012   ECOG Performance Status: 1 - Symptomatic but completely ambulatory   General appearance: alert, cooperative and appears stated age 56: ATNC Lymph node survey: non-palpable inguinal nodes Abdomen: SNDNT. Incision all well healed. No hernias, no masses, no ascites.  Extremities:No edema Neurological exam reveals alert, oriented, normal speech, no focal findings or movement disorder noted  Pelvic: chaperoned by NP; Vulva: scattered white epithelium diffusely with atrophic appearing tissue, small 3 mm vulvar lesion involving the right labia minora (~10 o'clock), no tenderness; Vagina: normal; Adnexa: negative for masses or nodularity; Uterus and Cervix: surgically absent; Rectal: deferred  VULVAR BIOPSY:  The risks and benefits of the procedure were reviewed and informed consent obtained. Time out was performed. The patient received pre-procedure teaching and  expressed understanding. The post-procedure instructions were reviewed with the patient and she expressed understanding. The patient does not have any barriers to learning.  The right vulvar 3 mm lesion area was numbed with lidocaine jelly and injected with lidocaine. A biopsy forceps was used to obtained a biopsy.  Hemostasis was obtained with silver nitrate. She tolerated the procedure well.   Post-procedure evaluation the patient was stable without complaints.     Assessment:  Marcia [name] is a 48 y.o. female with a history of vulvar dysplasia VINIII s/p laser 04/07/15. Vulvar lesion biopsied 7/18 L[facility]IL.  Vulvar lesion biopsied off 4/19 VINIII and positive margin with negative exam on followup. Exam today consistent with recurrent VIN, vs dermatitis vs lichen schlerosus.   Tobacco use.   Plan:   Problem List Items Addressed This Visit      Genitourinary   Vulvar intraepithelial neoplasia (VIN) grade 3 - Primary       Follow up biopsy and treat accordingly based on findings of pathology. We reviewed importance of tobacco cessation in controlling VIN. She had previously declined referral to quit smart program.   RTC scheduled in 3 months but will be determined based on pathology.    Verlon Au, NP   I personally had a face to face interaction and evaluated the patient jointly with the NP, Ms. Beckey Rutter.  I have reviewed her history and available records and have performed the key portions of the physical exam including lymph node survey, abdominal exam, pelvic exam with my findings confirming those documented above by the APP.  I have discussed the case with the APP and the patient.  I agree with the  above documentation, assessment and plan which was fully formulated by me.  Counseling was completed by me.   I personally saw the patient and performed a substantive portion of this encounter in conjunction with the listed APP as documented above.  A total of 15 minutes (not  included biopsy time) were spent with the patient/family today; >50% was spent in education, counseling and coordination of care for dysplasia.   [place] Gaetana Michaelis, MD

## 2019-03-03 LAB — SURGICAL PATHOLOGY

## 2019-03-10 ENCOUNTER — Telehealth: Payer: Self-pay | Admitting: Nurse Practitioner

## 2019-03-10 MED ORDER — CLOBETASOL PROPIONATE 0.05 % EX OINT: TOPICAL_OINTMENT | CUTANEOUS | 0 refills | Status: DC

## 2019-03-10 NOTE — Telephone Encounter (Signed)
Called patient with results of biopsy which showed chronic dermatitis. Discussed findings with Dr. Theora Gianotti who recommends vulvar hygiene and interruption of scratch-itch-scratch cycle by using topical steroids such as clobetasol.   Start clobetasol 0.05% ointment topically BID for two weeks. Apply sparingly (62mm wide dot in thin film over affected area). I will see her via mychart virtual visit in 2 weeks to reassess symptoms. If symptoms improved at that time I will taper to nightly for 6-12 weeks then 1-3 times a week for maintenance. If symptoms are not improved then I would go up to TID for 2 weeks then begin taper back to BID, etc.   Patient expressed understanding and is in agreement with this plan. I asked her to call the clinic if any questions or concerns.

## 2019-03-25 ENCOUNTER — Inpatient Hospital Stay: Payer: BLUE CROSS/BLUE SHIELD | Attending: Nurse Practitioner | Admitting: Nurse Practitioner

## 2019-03-27 ENCOUNTER — Telehealth: Payer: Self-pay | Admitting: *Deleted

## 2019-03-27 NOTE — Telephone Encounter (Signed)
Called patient back and rescheduled the appointment for Friday, 04/04/19 at 10:00, due to Beckey Rutter, NP covering [place] on 03/28/19. Patient verbalized understanding of new appointment day and time.

## 2019-03-27 NOTE — Telephone Encounter (Signed)
Spoke to patient via telephone, per Beckey Rutter, NP's request, re: her missed MyChart video visit from 03/25/19. Patient stated that she totally forgot and she was so sorry. Appointment rescheduled for Friday, 03/28/19 at 10:00. Patient verbalized understanding of new appointment day and time.    dhs

## 2019-03-27 NOTE — Telephone Encounter (Signed)
-----   Message from Verlon Au, NP sent at 03/26/2019  9:03 AM EDT ----- Marcia [name]- could you follow up with patient re: her missed appt yesterday and get her rescheduled? Please and thank you!  Ander Purpura

## 2019-03-31 ENCOUNTER — Telehealth: Payer: BLUE CROSS/BLUE SHIELD | Admitting: Nurse Practitioner

## 2019-04-04 ENCOUNTER — Inpatient Hospital Stay: Payer: BLUE CROSS/BLUE SHIELD | Admitting: Nurse Practitioner

## 2019-04-11 ENCOUNTER — Encounter: Payer: Self-pay | Admitting: Nurse Practitioner

## 2019-04-11 ENCOUNTER — Inpatient Hospital Stay: Payer: BLUE CROSS/BLUE SHIELD | Attending: Nurse Practitioner | Admitting: Nurse Practitioner

## 2019-04-11 ENCOUNTER — Other Ambulatory Visit: Payer: Self-pay

## 2019-04-11 DIAGNOSIS — D071 Carcinoma in situ of vulva: Secondary | ICD-10-CM | POA: Diagnosis not present

## 2019-04-11 DIAGNOSIS — L309 Dermatitis, unspecified: Secondary | ICD-10-CM

## 2019-04-11 MED ORDER — CLOBETASOL PROPIONATE 0.05 % EX OINT: TOPICAL_OINTMENT | CUTANEOUS | 0 refills | Status: DC

## 2019-04-11 NOTE — Progress Notes (Signed)
Virtual Visit Progress Note  Symptom Management Clinic [facility]  [phone] [phone]  I connected with Marcia [name] on 04/11/19 at 10:30 AM EDT by video enabled telemedicine visit and verified that I am speaking with the correct person using two identifiers.   I discussed the limitations, risks, security and privacy concerns of performing an evaluation and management service by telemedicine and the availability of in-person appointments. I also discussed with the patient that there may be a patient responsible charge related to this service. The patient expressed understanding and agreed to proceed.   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    Interval history- Marcia [name], 48 year old female, diagnosed with VIN 3 and chronic vulvar dermatitis, who presents to symptom management clinic for reevaluation of clobetasol.   She was seen by Dr. Theora Gianotti on 02/26/2019.  Biopsy was obtained at that time which showed squamous hyperplasia with hyperparakeratosis consistent with chronic dermatitis.  No findings suggestive of lichen sclerosis. She was started on clobetasol 0.05% twice a day and has been using topically since early August.   Today, she says that the itching stops when she applies the medicine but occurs throughout the day.  She says she is tolerating medication well without significant side effects.  ECOG FS:1 - Symptomatic but completely ambulatory   Review of systems- Review of Systems  Constitutional: Negative for chills, fever and malaise/fatigue.  Respiratory: Negative for cough and shortness of breath.   Cardiovascular: Negative for chest pain and palpitations.  Gastrointestinal: Negative for abdominal pain, constipation and diarrhea.  Genitourinary: Negative for dysuria, frequency and urgency.  Musculoskeletal: Negative for falls and myalgias.  Skin: Positive for itching. Negative for rash.  Psychiatric/Behavioral: Negative for depression. Suicidal ideas:  The patient is not nervous/anxious.       Allergies  Allergen Reactions  . Chantix [Varenicline] Rash  . Septra [Sulfamethoxazole-Trimethoprim] Rash    Past Medical History:  Diagnosis Date  . Diabetes mellitus   . Hay fever    Allergies  . Headache    only from neck pain  . Heart murmur    at birth   . History of HPV infection   . Hypercholesterolemia   . Hypothyroidism   . Melanoma of skin ([place]) 2010   In her vaginal area with surgical resection.   . Myocardial infarction ([place]) 1997   At age 50  . Ovarian cyst 05/09/2017  . Radiculopathy of cervical region    B/L  . Thyroid disease   . Tobacco abuse   . Vulvar lesion   . Wears glasses     Past Surgical History:  Procedure Laterality Date  . ABDOMINAL HYSTERECTOMY  10/12/12  . ANKLE SURGERY Right 2010   Fractued ankle  . ANTERIOR CERVICAL DECOMPRESSION/DISCECTOMY FUSION 4 LEVELS N/A 04/18/2018   Procedure: ANTERIOR CERVICAL DECOMPRESSION FUSION. CERVICAL 4-5, CERVICAL 5-6, CERVICAL 6-7 WITH INSTRUMENTATION AND ALLOGRAFT.;  Surgeon: Phylliss Bob, MD;  Location: [facility];  Service: Orthopedics;  Laterality: N/A;  .  BREAST BIOPSY Bilateral @ 2013   core with clips  . FLEXIBLE BRONCHOSCOPY N/A 05/30/2017   Procedure: FLEXIBLE BRONCHOSCOPY;  Surgeon: Laverle Hobby, MD;  Location: [facility];  Service: Pulmonary;  Laterality: N/A;  . LAPAROSCOPIC SALPINGO OOPHERECTOMY Left 06/14/2017   Procedure:  LAPAROSCOPIC SALPINGO OOPHORECTOMY;  Surgeon: Gae Dry, MD;  Location: [facility];  Service: Gynecology;  Laterality: Left;  . SHOULDER SURGERY Right 09/2018  . TUBAL LIGATION  2004  . TUBAL LIGATION  2004, [facility]  . VULVAR LESION REMOVAL  10/2009 & 05/2012  . VULVAR LESION REMOVAL N/A 04/07/2015   Procedure: VULVAR LESION;  Surgeon: Gillis Ends, MD;  Location: [facility];  Service: Gynecology;  Laterality: N/A;  with J-Plasma blade    Social History   Socioeconomic History  . Marital status: Married    Spouse name: Not on file  . Number of children: Not on file  . Years of education: Not on file  . Highest education level: Not on file  Occupational History  . Not on file  Social Needs  . Financial resource strain: Not on file  . Food insecurity    Worry: Not on file    Inability: Not on file  . Transportation needs    Medical: Not on file    Non-medical: Not on file  Tobacco Use  . Smoking status: Current Every Day Smoker    Packs/day: 0.50    Years: 22.00    Pack years: 11.00    Types: Cigarettes    Last attempt to quit: 03/07/2018    Years since quitting: 1.0  . Smokeless tobacco: Never Used  Substance and Sexual Activity  . Alcohol use: Yes    Alcohol/week: 0.0 standard drinks    Comment: occasional beer  . Drug use: No  . Sexual activity: Yes    Birth control/protection: Surgical  Lifestyle  . Physical activity    Days per week: Not on file    Minutes per session: Not on file  . Stress: Not on file  Relationships  . Social Herbalist on phone: Not on file    Gets together: Not on file    Attends religious service: Not on file    Active member of club or organization: Not on file    Attends meetings of clubs or organizations: Not on file    Relationship status: Not on file  . Intimate partner violence    Fear of current or ex partner: Not on file    Emotionally abused: Not on file    Physically abused: Not on file    Forced sexual  activity: Not on file  Other Topics Concern  . Not on file  Social History Narrative   No regular exercise.   Lives with spouse.    Family History  Problem Relation Age of Onset  . Diabetes Father   . Hypertension Mother   . Hyperlipidemia Mother   . Diabetes Mother   . Aneurysm Brother   . Breast cancer Maternal Aunt 40       lung and ovarian     Current Outpatient Medications:  .  ACCU-CHEK AVIVA PLUS test strip, TEST 2 TIMES A DAY, Disp: 100 each, Rfl: 6 .  aspirin EC 81 MG tablet, Take 81 mg by mouth daily., Disp: , Rfl:  .  atorvastatin (LIPITOR) 40 MG tablet, Take 40 mg by mouth at bedtime. , Disp: , Rfl:  .  B-D ULTRAFINE III SHORT PEN 31G  X 8 MM MISC, USE TWO NEEDLES DAILY, Disp: 100 each, Rfl: 8 .  Blood Glucose Monitoring Suppl (ACCU-CHEK AVIVA PLUS) W/DEVICE KIT, 1 Device by Does not apply route once., Disp: 1 kit, Rfl: 0 .  canagliflozin (INVOKANA) 300 MG TABS tablet, Take 300 mg by mouth daily before breakfast., Disp: , Rfl:  .  clobetasol ointment (TEMOVATE) 0.05 %, Apply a 18m wide dot in thin layer topically to affected area three times a day for two weeks., Disp: 30 g, Rfl: 0 .  ergocalciferol (VITAMIN D2) 50000 units capsule, Take 50,000 Units by mouth every Saturday. , Disp: , Rfl:  .  insulin aspart (NOVOLOG FLEXPEN) 100 UNIT/ML FlexPen, 4 units with breakfast, and 4 units with the evening meal. (Patient taking differently: Inject 2-12 Units into the skin 3 (three) times daily with meals. Per sliding scale), Disp: 15 mL, Rfl: 11 .  Insulin Degludec (TRESIBA FLEXTOUCH) 200 UNIT/ML SOPN, Inject 46 Units into the skin at bedtime., Disp: , Rfl:  .  levothyroxine (SYNTHROID, LEVOTHROID) 75 MCG tablet, Take 75 mcg by mouth daily before breakfast., Disp: , Rfl:  .  PARoxetine Mesylate (BRISDELLE) 7.5 MG CAPS, Take 7.5 mg by mouth daily before breakfast. , Disp: , Rfl:  No current facility-administered medications for this visit.   Facility-Administered Medications  Ordered in Other Visits:  .  lidocaine (XYLOCAINE) 2 % jelly 1 application, 1 application, Topical, QID PRN, SGillis Ends MD  Physical exam: Exam limited due to telemedicine Alert, well appearing female, no acute distress Oriented x 3.    CMP Latest Ref Rng & Units 04/15/2018  Glucose 70 - 99 mg/dL 69(L)  BUN 6 - 20 mg/dL 15  Creatinine 0.44 - 1.00 mg/dL 0.84  Sodium 135 - 145 mmol/L 141  Potassium 3.5 - 5.1 mmol/L 3.9  Chloride 98 - 111 mmol/L 105  CO2 22 - 32 mmol/L 25  Calcium 8.9 - 10.3 mg/dL 9.1  Total Protein 6.5 - 8.1 g/dL 7.4  Total Bilirubin 0.3 - 1.2 mg/dL 0.6  Alkaline Phos 38 - 126 U/L 66  AST 15 - 41 U/L 28  ALT 0 - 44 U/L 25   CBC Latest Ref Rng & Units 04/15/2018  WBC 4.0 - 10.5 K/uL 12.9(H)  Hemoglobin 12.0 - 15.0 g/dL 16.1(H)  Hematocrit 36.0 - 46.0 % 49.3(H)  Platelets 150 - 400 K/uL 275    No images are attached to the encounter.  No results found.  Assessment and plan- Patient is a 48y.o. female with chronic vulvar dermatitis, currently on clobetasol s/p 3.5 weeks of twice daily topical application and continuing to have symptoms of itching. Based on recommendation of Dr. STheora Gianottiand UpToDate, increase clobetasol to 3 times a day application for next 2 weeks.  At that time we will reevaluate her symptoms.  If improved would decrease to twice daily with goal of breaking the scratch itch scratch cycle  Disposition:  RTC in 2 weeks for virtual re-evaluation  Visit Diagnosis 1. Vulvar dermatitis    Patient expressed understanding and was in agreement with this plan. She also understands that She can call clinic at any time with any questions, concerns, or complaints.   I discussed the assessment and treatment plan with the patient. The patient was provided an opportunity to ask questions and all were answered. The patient agreed with the plan and demonstrated an understanding of the instructions.   The patient was advised to call back or seek an  in-person evaluation if the  symptoms worsen or if the condition fails to improve as anticipated.   I provided 10 minutes of face-to-face video visit time during this encounter, and > 50% was spent counseling as documented under my assessment & plan.  Thank you for allowing me to participate in the care of this very pleasant patient.   Beckey Rutter, DNP, AGNP-C [place] [facility] [place] (work cell) [phone] (office)  CC: Dr. Fransisca Connors

## 2019-04-14 ENCOUNTER — Encounter: Payer: Self-pay | Admitting: Nurse Practitioner

## 2019-04-25 ENCOUNTER — Inpatient Hospital Stay (HOSPITAL_BASED_OUTPATIENT_CLINIC_OR_DEPARTMENT_OTHER): Payer: BLUE CROSS/BLUE SHIELD | Admitting: Nurse Practitioner

## 2019-04-25 DIAGNOSIS — D071 Carcinoma in situ of vulva: Secondary | ICD-10-CM | POA: Diagnosis not present

## 2019-04-25 DIAGNOSIS — L309 Dermatitis, unspecified: Secondary | ICD-10-CM

## 2019-04-25 NOTE — Progress Notes (Signed)
Virtual Visit Progress Note  Symptom Management Clinic [facility]  [phone] [phone]  I connected with [name] on 04/25/19 at 10:30 AM EDT by video enabled telemedicine visit and verified that I am speaking with the correct person using two identifiers.   I discussed the limitations, risks, security and privacy concerns of performing an evaluation and management service by telemedicine and the availability of in-person appointments. I also discussed with the patient that there may be a patient responsible charge related to this service. The patient expressed understanding and agreed to proceed.   Other persons participating in the visit and their role in the encounter: none  Patient's location: home Provider's location: clinic    Patient Care Team: Danelle Berry, NP as PCP - General (Nurse Practitioner) Clent Jacks, RN as Registered Nurse   Name of the patient: Marcia [name]  [id number]  01-22-1971   Date of visit: 04/25/19  Diagnosis- Hx of VIN & Chronic Vulvar Dermatitis  Chief complaint/ Reason for visit- Chronic Vulvar Dermatitis  Heme/Onc history:  Oncology History   No history exists.    Interval history- Marcia [name], 48 year old female, diagnosed with VIN 3 and chronic vulvar dermatitis, who presents to symptom management clinic for reevaluation of clobetasol.   She was seen by Dr. Theora Gianotti on 02/26/2019.  Biopsy was obtained at that time which showed squamous hyperplasia with hyperparakeratosis consistent with chronic dermatitis.  No findings suggestive of lichen sclerosis. She was started on clobetasol 0.05% twice a day and has been using topically since early August.   Today, she continues clobetasol 3 times a day.  In the interim, she was treated for vaginal yeast infection.  She says that her itching has improved and she continues to tolerate clobetasol well without significant side effects.   ECOG FS:1 - Symptomatic but completely ambulatory  Review of systems- Review of Systems  Constitutional: Negative for chills, fever and malaise/fatigue.  Respiratory: Negative for cough and shortness of breath.   Cardiovascular: Negative for chest pain and palpitations.  Gastrointestinal: Negative for abdominal pain, constipation and diarrhea.  Genitourinary: Negative for dysuria, frequency and urgency.  Musculoskeletal: Negative for falls and myalgias.  Skin: Negative for itching and rash.  Psychiatric/Behavioral: Negative for depression. Suicidal ideas:  The patient is not nervous/anxious.       Allergies  Allergen Reactions  . Chantix [Varenicline] Rash  . Septra [Sulfamethoxazole-Trimethoprim] Rash    Past Medical History:  Diagnosis Date  . Diabetes mellitus   . Hay fever    Allergies  . Headache    only from neck pain  . Heart murmur    at birth   . History of HPV infection   . Hypercholesterolemia   . Hypothyroidism   . Melanoma of skin ([place]) 2010   In her vaginal area with surgical resection.   . Myocardial infarction ([place]) 1997   At age 28  . Ovarian cyst 05/09/2017  . Radiculopathy of cervical region    B/L  . Thyroid disease   . Tobacco abuse   . Vulvar lesion   . Wears glasses     Past Surgical History:  Procedure Laterality Date  . ABDOMINAL HYSTERECTOMY  10/12/12  . ANKLE SURGERY Right 2010   Fractued ankle  . ANTERIOR CERVICAL DECOMPRESSION/DISCECTOMY FUSION 4 LEVELS N/A 04/18/2018   Procedure: ANTERIOR CERVICAL DECOMPRESSION FUSION. CERVICAL 4-5, CERVICAL 5-6, CERVICAL 6-7 WITH INSTRUMENTATION AND ALLOGRAFT.;  Surgeon: Phylliss Bob, MD;  [facility]: [facility];  Service: Orthopedics;  Laterality: N/A;  . BREAST BIOPSY Bilateral @ 2013   core with clips  . FLEXIBLE BRONCHOSCOPY N/A 05/30/2017   Procedure: FLEXIBLE BRONCHOSCOPY;  Surgeon: Laverle Hobby, MD;  Location: [facility];  Service: Pulmonary;  Laterality: N/A;  . LAPAROSCOPIC SALPINGO  OOPHERECTOMY Left 06/14/2017   Procedure: LAPAROSCOPIC SALPINGO OOPHORECTOMY;  Surgeon: Gae Dry, MD;  Location: [facility];  Service: Gynecology;  Laterality: Left;  . SHOULDER SURGERY Right 09/2018  . TUBAL LIGATION  2004  . TUBAL LIGATION  2004, [facility]  . VULVAR LESION REMOVAL  10/2009 & 05/2012  . VULVAR LESION REMOVAL N/A 04/07/2015   Procedure: VULVAR LESION;  Surgeon: Gillis Ends, MD;  Location: [facility] OR[facility];  Service: Gynecology;  Laterality: N/A;  with J-Plasma blade    Social History   Socioeconomic History  . Marital status: Married    Spouse name: Not on file  . Number of children: Not on file  . Years of education: Not on file  . Highest education level: Not on file  Occupational History  . Not on file  Social Needs  . Financial resource strain: Not on file  . Food insecurity    Worry: Not on file    Inability: Not on file  . Transportation needs    Medical: Not on file    Non-medical: Not on file  Tobacco Use  . Smoking status: Current Every Day Smoker    Packs/day: 0.50    Years: 22.00    Pack years: 11.00    Types: Cigarettes    Last attempt to quit: 03/07/2018    Years since quitting: 1.1  . Smokeless tobacco: Never Used  Substance and Sexual Activity  . Alcohol use: Yes    Alcohol/week: 0.0 standard drinks    Comment: occasional beer  . Drug use: No  . Sexual activity: Yes    Birth control/protection: Surgical  Lifestyle  . Physical activity    Days per week: Not on file    Minutes per session: Not on file  . Stress: Not on file  Relationships  . Social Herbalist on phone: Not on file    Gets together: Not on file    Attends religious service: Not on file    Active member of club or organization: Not on file    Attends meetings of clubs or organizations: Not on file    Relationship status: Not on file  . Intimate partner violence    Fear of current or ex partner: Not on file    Emotionally abused: Not on file    Physically  abused: Not on file    Forced sexual activity: Not on file  Other Topics Concern  . Not on file  Social History Narrative   No regular exercise.   Lives with spouse.    Family History  Problem Relation Age of Onset  . Diabetes Father   . Hypertension Mother   . Hyperlipidemia Mother   . Diabetes Mother   . Aneurysm Brother   . Breast cancer Maternal Aunt 40       lung and ovarian     Current Outpatient Medications:  .  ACCU-CHEK AVIVA PLUS test strip, TEST 2 TIMES A DAY, Disp: 100 each, Rfl: 6 .  aspirin EC 81 MG tablet, Take 81 mg by mouth daily., Disp: , Rfl:  .  atorvastatin (LIPITOR) 40 MG tablet, Take 40 mg by mouth at bedtime. , Disp: , Rfl:  .  B-D ULTRAFINE III SHORT PEN 31G X 8 MM MISC, USE TWO NEEDLES DAILY, Disp: 100 each, Rfl: 8 .  Blood Glucose Monitoring Suppl (ACCU-CHEK AVIVA PLUS) W/DEVICE KIT, 1 Device by Does not apply route once., Disp: 1 kit, Rfl: 0 .  canagliflozin (INVOKANA) 300 MG TABS tablet, Take 300 mg by mouth daily before breakfast., Disp: , Rfl:  .  clobetasol ointment (TEMOVATE) 0.05 %, Apply a 39m wide dot in thin layer topically to affected area three times a day for two weeks., Disp: 30 g, Rfl: 0 .  ergocalciferol (VITAMIN D2) 50000 units capsule, Take 50,000 Units by mouth every Saturday. , Disp: , Rfl:  .  insulin aspart (NOVOLOG FLEXPEN) 100 UNIT/ML FlexPen, 4 units with breakfast, and 4 units with the evening meal. (Patient taking differently: Inject 2-12 Units into the skin 3 (three) times daily with meals. Per sliding scale), Disp: 15 mL, Rfl: 11 .  Insulin Degludec (TRESIBA FLEXTOUCH) 200 UNIT/ML SOPN, Inject 46 Units into the skin at bedtime., Disp: , Rfl:  .  levothyroxine (SYNTHROID, LEVOTHROID) 75 MCG tablet, Take 75 mcg by mouth daily before breakfast., Disp: , Rfl:  .  PARoxetine Mesylate (BRISDELLE) 7.5 MG CAPS, Take 7.5 mg by mouth daily before breakfast. , Disp: , Rfl:  No current facility-administered medications for this visit.    Facility-Administered Medications Ordered in Other Visits:  .  lidocaine (XYLOCAINE) 2 % jelly 1 application, 1 application, Topical, QID PRN, SGillis Ends MD  Physical exam: Exam limited due to telemedicine Alert and oriented.  Well-appearing female.  No acute distress.   CMP Latest Ref Rng & Units 04/15/2018  Glucose 70 - 99 mg/dL 69(L)  BUN 6 - 20 mg/dL 15  Creatinine 0.44 - 1.00 mg/dL 0.84  Sodium 135 - 145 mmol/L 141  Potassium 3.5 - 5.1 mmol/L 3.9  Chloride 98 - 111 mmol/L 105  CO2 22 - 32 mmol/L 25  Calcium 8.9 - 10.3 mg/dL 9.1  Total Protein 6.5 - 8.1 g/dL 7.4  Total Bilirubin 0.3 - 1.2 mg/dL 0.6  Alkaline Phos 38 - 126 U/L 66  AST 15 - 41 U/L 28  ALT 0 - 44 U/L 25   CBC Latest Ref Rng & Units 04/15/2018  WBC 4.0 - 10.5 K/uL 12.9(H)  Hemoglobin 12.0 - 15.0 g/dL 16.1(H)  Hematocrit 36.0 - 46.0 % 49.3(H)  Platelets 150 - 400 K/uL 275    No images are attached to the encounter.  No results found.  Assessment and plan- Patient is a 48y.o. female with chronic vulvar dermatitis, currently on clobetasol, s/p 3 weeks of twice daily topical application, symptoms persisted, increased to three times day, who returns to clinic for re-evaluation. Symptoms improved on three time a day dosing and interval treatment of vaginal yeast infection. Decrease clobetasol application to 2 times a day. Will re-evaluation in 2 weeks. If improved consider once a day dosing at that time with continued goal of breaking the scratch-itch-scratch cycle.   Disposition:  RTC in 2 weeks for virtual re-evaluation  Visit Diagnosis 1. Vulvar dermatitis    Patient expressed understanding and was in agreement with this plan. She also understands that She can call clinic at any time with any questions, concerns, or complaints.   I discussed the assessment and treatment plan with the patient. The patient was provided an opportunity to ask questions and all were answered. The patient agreed with the  plan and demonstrated an understanding of the instructions.   The patient  was advised to call back or seek an in-person evaluation if the symptoms worsen or if the condition fails to improve as anticipated.   I provided 5 minutes of face-to-face video visit time during this encounter, and > 50% was spent counseling as documented under my assessment & plan.  Thank you for allowing me to participate in the care of this very pleasant patient.   Beckey Rutter, DNP, AGNP-C [place] [facility] [phone] (work cell) [phone] (office)

## 2019-05-08 ENCOUNTER — Encounter: Payer: Self-pay | Admitting: Nurse Practitioner

## 2019-05-08 ENCOUNTER — Other Ambulatory Visit: Payer: Self-pay

## 2019-05-08 NOTE — Progress Notes (Signed)
Called patient today for Telehealth visit tomorrow via [facility] for follow up.  Patient denies any nausea, vomiting, diarrhea, constipation, decrease in appetite or pain. Patient has no new concerns.

## 2019-05-09 ENCOUNTER — Inpatient Hospital Stay: Payer: BLUE CROSS/BLUE SHIELD | Attending: Nurse Practitioner | Admitting: Nurse Practitioner

## 2019-05-27 ENCOUNTER — Other Ambulatory Visit: Payer: Self-pay

## 2019-05-27 NOTE — Progress Notes (Signed)
Patient pre screened for office appointment, no questions or concerns today. 

## 2019-05-28 ENCOUNTER — Encounter: Payer: Self-pay | Admitting: Obstetrics and Gynecology

## 2019-05-28 ENCOUNTER — Other Ambulatory Visit: Payer: Self-pay

## 2019-05-28 ENCOUNTER — Inpatient Hospital Stay: Payer: BLUE CROSS/BLUE SHIELD | Attending: Obstetrics and Gynecology | Admitting: Obstetrics and Gynecology

## 2019-05-28 VITALS — BP 123/94 | HR 81 | Temp 97.6°F | Resp 16 | Wt 162.9 lb

## 2019-05-28 DIAGNOSIS — Z90710 Acquired absence of both cervix and uterus: Secondary | ICD-10-CM | POA: Diagnosis not present

## 2019-05-28 DIAGNOSIS — Z90722 Acquired absence of ovaries, bilateral: Secondary | ICD-10-CM | POA: Insufficient documentation

## 2019-05-28 DIAGNOSIS — N903 Dysplasia of vulva, unspecified: Secondary | ICD-10-CM | POA: Insufficient documentation

## 2019-05-28 DIAGNOSIS — F17210 Nicotine dependence, cigarettes, uncomplicated: Secondary | ICD-10-CM | POA: Insufficient documentation

## 2019-05-28 DIAGNOSIS — Z7982 Long term (current) use of aspirin: Secondary | ICD-10-CM | POA: Diagnosis not present

## 2019-05-28 DIAGNOSIS — D071 Carcinoma in situ of vulva: Secondary | ICD-10-CM

## 2019-05-28 DIAGNOSIS — A630 Anogenital (venereal) warts: Secondary | ICD-10-CM | POA: Insufficient documentation

## 2019-05-28 DIAGNOSIS — Z79899 Other long term (current) drug therapy: Secondary | ICD-10-CM | POA: Diagnosis not present

## 2019-05-28 NOTE — Progress Notes (Signed)
No gyn concerns 

## 2019-05-28 NOTE — Progress Notes (Signed)
Gynecologic Oncology Interval Note  Referring Provider: Dr. 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 female, diagnosed with VIN III, who returns to clinic today for continued management.   More recently had vulvar itching and scratching. 7/22 visit had colposcopy with small 3 mm vulvar lesion involving the right labia minora. Biopsy showed only hyperparakeratosis. Treated with clobetasol. Feels better now and stopped using it.  No other complaints.    Treatment History:  Prior hysterectomy   08/2009     VIN II                 laser vaporization 03/2010     VIN II                 Aldara started,well tolerated with reduced frequency, 04/2011     infection peri-clitoral area, responding to antibiotic therapy 06/2012   recurrent VIN III on biopsy of left labia minora, PAP negative. 1/14     laser vaporization of raised epithelium consistent with VIN, mainly on the anterior portion of the labia minora. A few other scattered areas around the external genitalia.  04/07/15:  Had laser ablation in OR with Dr Theora Gianotti.  "Acetowhite epithelium involving bilateral labia minora of the vulva each approximately 3 cm. On the left the abnormalities extending to the medial aspect of the labia minora. There was a another patch of acetowhite on right vulva at 3 o'clock as well as another area at the perineum. There were scattered small 1-2 mm areas of acetowhite extending toward the anus."  02/14/17   Vulvar biopsies, right VIN2 grossly removed, left keratosis. DIAGNOSIS: VULVA, RIGHT ANTERIOR; BIOPSY:  - KERATOSIS AND MILD SQUAMOUS DYSPLASIA.  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  - A PERIPHERAL EDGE IS INVOLVED.   Seen in clinic on 04/11/17.  At that time, she had a new 84m wart near introitus at 7:00 which was removed.  Wet prep positive for yeast.  Negative for trichomoniasis and clue cells.    Pathology  04/11/2017:   DIAGNOSIS:  A. VULVA, RIGHT; BIOPSY/REMOVAL:  - LOW-GRADE SQUAMOUS INTRAEPITHELIAL LESION (CONDYLOMA).  - CANDIDIASIS.  - NEGATIVE FOR HIGH-GRADE SQUAMOUS INTRAEPITHELIAL LESION AND MALIGNANCY.  Comment: There are focal intraepithelial neutrophils, and PAS stain for fungi demonstrates a few intraepithelial pseudohyphae.   She was treated with Diflucan 150 mg daily X 3 days and used clobetasol topically for persistent symptoms.   She had her left ovary removed 05/2017 with Dr. HKenton Kingfisherd/t cyst and pain.   4/19 Pathology:  A. RIGHT VULVA; BIOPSY:  - HIGH-GRADE SQUAMOUS INTRAEPITHELIAL LESION (VIN III).  - A PERIPHERAL BIOPSY EDGE IS INVOLVED.  8/19 - normal exam and asymptomatic.   4/20 - telemedicine visit with no symptoms.  GYN History:   Gravida 3    Para 2    Age at Menarche 89   Regular Pap Smears Yes    History of HPV Infection    Cycle Normal       Additional Hx no other gyn problems in the past   Problem List: Patient Active Problem List   Diagnosis Date Noted  . Vulvar dermatitis 04/11/2019  . Right shoulder pain 09/03/2018  . Radiculopathy 04/18/2018  . Left lower quadrant pain 05/22/2017  . Ovarian cyst, complex 05/11/2017  . Leukocytosis 05/19/2016  . Erythrocytosis 05/19/2016  . Neck pain on left side 05/19/2016  . Vulvar intraepithelial neoplasia (VIN) grade  3 03/10/2015  . 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 Staphylococcal infection of skin 10/13/2014  . Furuncle of vulva 10/13/2014  . Hyperlipidemia due to type 1 diabetes mellitus ([place]) 09/12/2014  . Encounter for preventive health examination 09/12/2014  . Onychomycosis of toenail 09/10/2014  . Type 1 diabetes mellitus with neurological manifestations, uncontrolled ([place]) 02/10/2014  . Chronic cough 02/05/2014  . Carpal tunnel syndrome 12/17/2013  . Chronic bronchitis ([place]) 11/27/2013  . Unspecified constipation 11/11/2013  . Left wrist pain 11/07/2013  . Insomnia secondary to  anxiety 07/09/2013  . History of hyperthyroidism 07/01/2013  . Degenerative TFCC tear 05/23/2013  . Hyperthyroidism 04/28/2013  . Right wrist pain 04/28/2013  . Type I diabetes mellitus ([place]) 04/28/2013  . Snoring disorder 12/26/2012  . Tobacco abuse 05/22/2011  . Tobacco abuse counseling 05/22/2011  . ABNORMAL EKG 09/10/2009  . Dyslipidemia 04/02/2006    Past Medical History: Past Medical History:  Diagnosis Date  . Diabetes mellitus   . Hay fever    Allergies  . Headache    only from neck pain  . Heart murmur    at birth   . History of HPV infection   . Hypercholesterolemia   . Hypothyroidism   . Melanoma of skin ([place]) 2010   In her vaginal area with surgical resection.   . Myocardial infarction ([place]) 1997   At age 72  . Ovarian cyst 05/09/2017  . Radiculopathy of cervical region    B/L  . Thyroid disease   . Tobacco abuse   . Vulvar lesion   . Wears glasses     Past Surgical History: Past Surgical History:  Procedure Laterality Date  . ABDOMINAL HYSTERECTOMY  10/12/12  . ANKLE SURGERY Right 2010   Fractued ankle  . ANTERIOR CERVICAL DECOMPRESSION/DISCECTOMY FUSION 4 LEVELS N/A 04/18/2018   Procedure: ANTERIOR CERVICAL DECOMPRESSION FUSION. CERVICAL 4-5, CERVICAL 5-6, CERVICAL 6-7 WITH INSTRUMENTATION AND ALLOGRAFT.;  Surgeon: Phylliss Bob, MD;  Location: [facility];  Service: Orthopedics;  Laterality: N/A;  . BREAST BIOPSY Bilateral @ 2013   core with clips  . FLEXIBLE BRONCHOSCOPY N/A 05/30/2017   Procedure: FLEXIBLE BRONCHOSCOPY;  Surgeon: Laverle Hobby, MD;  Location: [facility];  Service: Pulmonary;  Laterality: N/A;  . LAPAROSCOPIC SALPINGO OOPHERECTOMY Left 06/14/2017   Procedure: LAPAROSCOPIC SALPINGO OOPHORECTOMY;  Surgeon: Gae Dry, MD;  Location: [facility];  Service: Gynecology;  Laterality: Left;  . SHOULDER SURGERY Right 09/2018  . TUBAL LIGATION  2004  . TUBAL LIGATION  2004, [facility]  . VULVAR LESION REMOVAL  10/2009 & 05/2012  . VULVAR LESION  REMOVAL N/A 04/07/2015   Procedure: VULVAR LESION;  Surgeon: Gillis Ends, MD;  Location: [facility];  Service: Gynecology;  Laterality: N/A;  with J-Plasma blade   Family History: Family History  Problem Relation Age of Onset  . Diabetes Father   . Hypertension Mother   . Hyperlipidemia Mother   . Diabetes Mother   . Aneurysm Brother   . Breast cancer Maternal Aunt 40       lung and ovarian   Social History: Social History   Socioeconomic History  . Marital status: Married    Spouse name: Not on file  . Number of children: Not on file  . Years of education: Not on file  . Highest education level: Not on file  Occupational History  . Not on file  Social Needs  . Financial resource strain: Not on file  . Food insecurity    Worry: Not  on file    Inability: Not on file  . Transportation needs    Medical: Not on file    Non-medical: Not on file  Tobacco Use  . Smoking status: Current Every Day Smoker    Packs/day: 0.50    Years: 22.00    Pack years: 11.00    Types: Cigarettes    Last attempt to quit: 03/07/2018    Years since quitting: 1.2  . Smokeless tobacco: Never Used  Substance and Sexual Activity  . Alcohol use: Yes    Alcohol/week: 0.0 standard drinks    Comment: occasional beer  . Drug use: No  . Sexual activity: Yes    Birth control/protection: Surgical  Lifestyle  . Physical activity    Days per week: Not on file    Minutes per session: Not on file  . Stress: Not on file  Relationships  . Social Herbalist on phone: Not on file    Gets together: Not on file    Attends religious service: Not on file    Active member of club or organization: Not on file    Attends meetings of clubs or organizations: Not on file    Relationship status: Not on file  . Intimate partner violence    Fear of current or ex partner: Not on file    Emotionally abused: Not on file    Physically abused: Not on file    Forced sexual activity: Not on file   Other Topics Concern  . Not on file  Social History Narrative   No regular exercise.   Lives with spouse.    Allergies: Allergies  Allergen Reactions  . Chantix [Varenicline] Rash  . Septra [Sulfamethoxazole-Trimethoprim] Rash    Current Medications: Current Outpatient Medications  Medication Sig Dispense Refill  . ACCU-CHEK AVIVA PLUS test strip TEST 2 TIMES A DAY 100 each 6  . aspirin EC 81 MG tablet Take 81 mg by mouth daily.    Marland Kitchen atorvastatin (LIPITOR) 40 MG tablet Take 40 mg by mouth at bedtime.     . B-D ULTRAFINE III SHORT PEN 31G X 8 MM MISC USE TWO NEEDLES DAILY 100 each 8  . Blood Glucose Monitoring Suppl (ACCU-CHEK AVIVA PLUS) W/DEVICE KIT 1 Device by Does not apply route once. 1 kit 0  . canagliflozin (INVOKANA) 300 MG TABS tablet Take 300 mg by mouth daily before breakfast.    . clobetasol ointment (TEMOVATE) 0.05 % Apply a 49m wide dot in thin layer topically to affected area three times a day for two weeks. 30 g 0  . ergocalciferol (VITAMIN D2) 50000 units capsule Take 50,000 Units by mouth every Saturday.     . insulin aspart (NOVOLOG FLEXPEN) 100 UNIT/ML FlexPen 4 units with breakfast, and 4 units with the evening meal. (Patient taking differently: Inject 2-12 Units into the skin 3 (three) times daily with meals. Per sliding scale) 15 mL 11  . Insulin Degludec (TRESIBA FLEXTOUCH) 200 UNIT/ML SOPN Inject 46 Units into the skin at bedtime.    .Marland Kitchenlevothyroxine (SYNTHROID, LEVOTHROID) 75 MCG tablet Take 75 mcg by mouth daily before breakfast.    . PARoxetine Mesylate (BRISDELLE) 7.5 MG CAPS Take 7.5 mg by mouth daily before breakfast.      No current facility-administered medications for this visit.    Facility-Administered Medications Ordered in Other Visits  Medication Dose Route Frequency Provider Last Rate Last Dose  . lidocaine (XYLOCAINE) 2 % jelly 1 application  1 application  Topical QID PRN Gillis Ends, MD        Review of Systems General:  no  complaints, no fever Skin: no complaints Eyes: no complaints HEENT: no complaints Breasts: no complaints Pulmonary: no complaints, no cough dyspnea.  Cardiac: no complaints Gastrointestinal: no complaints Genitourinary/Sexual: no complaints Ob/Gyn: no complaints Musculoskeletal: no complaints Hematology: no complaints Neurologic/Psych: no complaints   Objective:   Physical Examination:  Vitals:   05/28/19 0959  BP: (!) 123/94  Pulse: 81  Resp: 16  Temp: 97.6 F (36.4 C)   ECOG Performance Status: 0 asymptomatic Physical Exam General appearance: alert, cooperative and appears stated age 60: ATNC Lymph node survey: non-palpable inguinal nodes Abdomen: SNDNT.  No hernias, no masses, no ascites.  Extremities:No edema Neurological exam reveals alert, oriented, normal speech, no focal findings or movement disorder noted  Pelvic: chaperoned by NP; Vulva: no new lesions; Vagina: normal; Adnexa: negative for masses or nodularity; Uterus and Cervix: surgically absent; Rectal: deferred  Assessment:  Marcia [name] is a 48 y.o. female with a history of vulvar dysplasia VINIII s/p laser 04/07/15. Vulvar lesion biopsied 7/18 LGSIL.  Vulvar lesion biopsied off 4/19 VINIII and positive margin but no evidence of VIN on exam 8/19. Had some itching in 7/20 and biopsy negative.  She used clobetasol with resolution of symptoms.  She remains asymptomatic now.  Normal exam today.   Plan:   Problem List Items Addressed This Visit      Genitourinary   Vulvar intraepithelial neoplasia (VIN) grade 3 - Primary       We will see her in about 6 months for exam.  She can return sooner if any new symptoms.     I discussed the assessment and treatment plan with the patient. The patient was provided an opportunity to ask questions and all were answered. The patient agreed with the plan and demonstrated an understanding of the instructions.    Mellody Drown, MD  CC:  Danelle Berry,  [facility] [address] [place],  [place] [address] [phone]

## 2019-06-13 IMAGING — CT CT CHEST W/ CM
1 series · 15 of 34 positions shown, 19 images · IV contrast (iopamidol)
Comparison: None.

CLINICAL DATA: Persistent dry cough.

EXAM:
CT CHEST WITH CONTRAST
TECHNIQUE: Multidetector CT imaging of the chest was performed during
intravenous contrast administration.
CONTRAST:  75mL [id number] IOPAMIDOL ([id number]) INJECTION 61%

[Series 2: axial st · axial · 0.68mm/px · z∈[-570,-290]mm · 15 of 166 slices shown, 19 images]
[im 13/166  mediastinal]
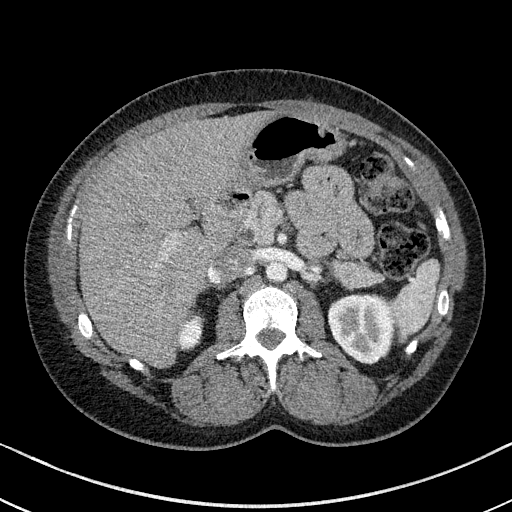
[im 13/166  lung]
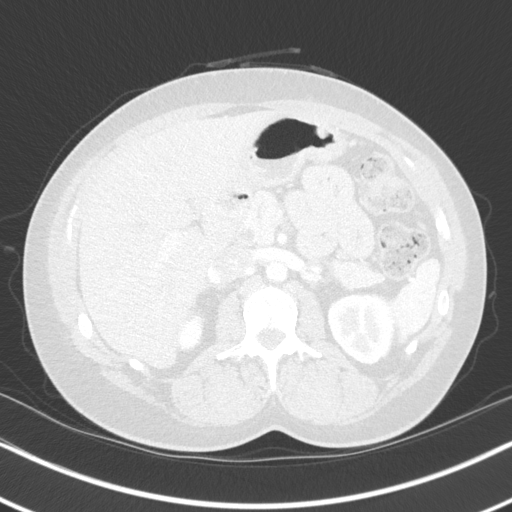
[im 25/166  lung]
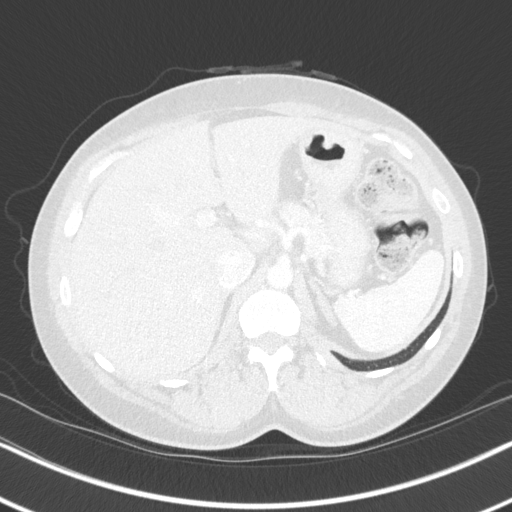
[im 34/166  lung]
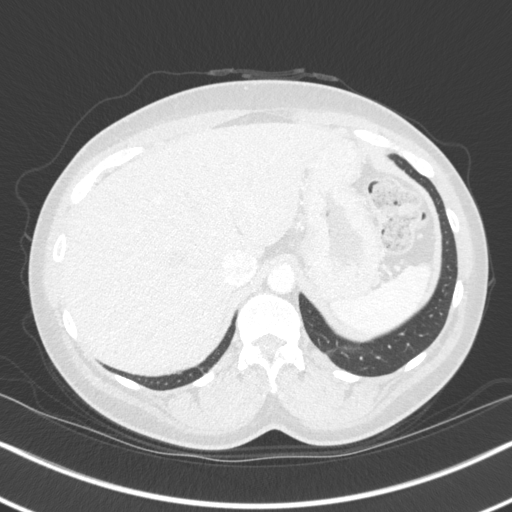
[im 43/166  lung]
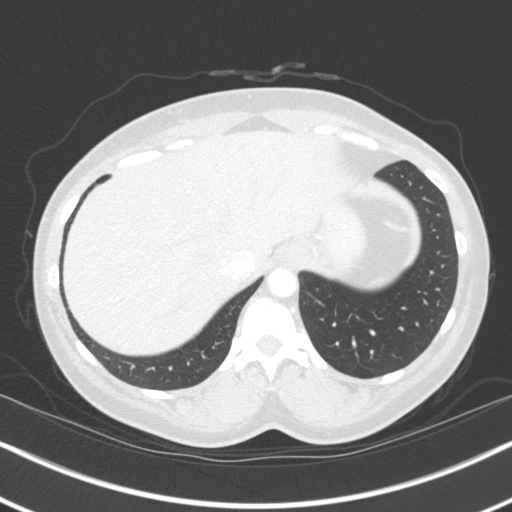
[im 56/166  mediastinal]
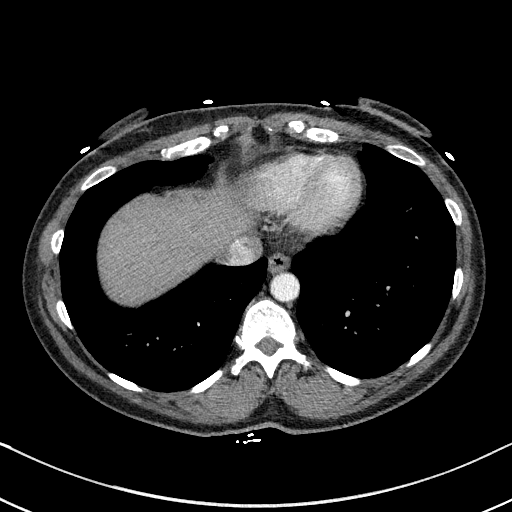
[im 56/166  lung]
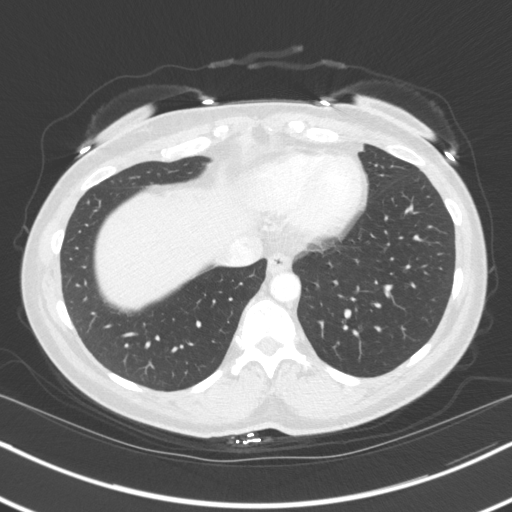
[im 67/166  lung]
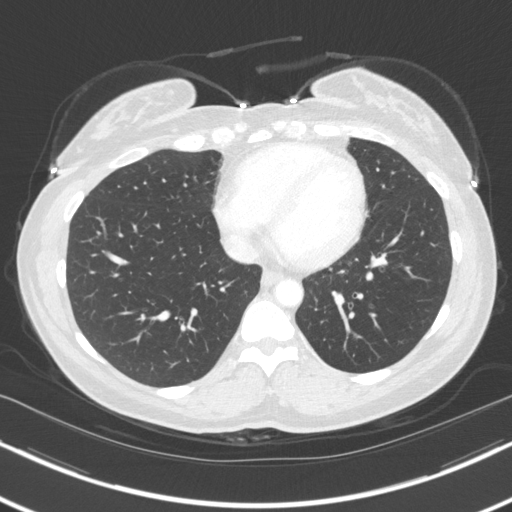
[im 74/166  lung]
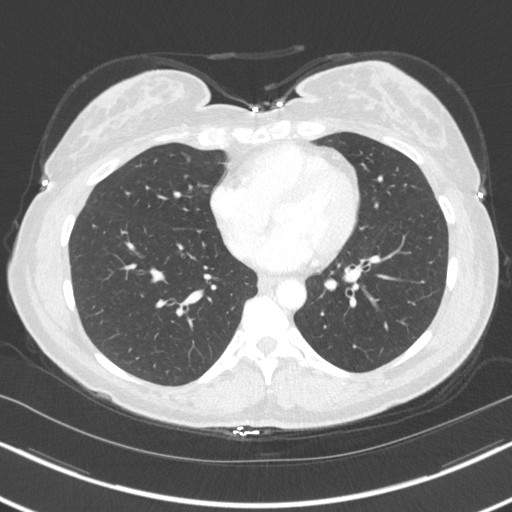
[im 86/166  lung]
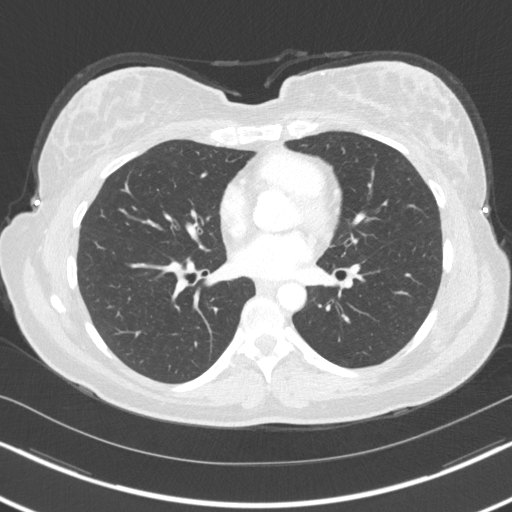
[im 92/166  mediastinal]
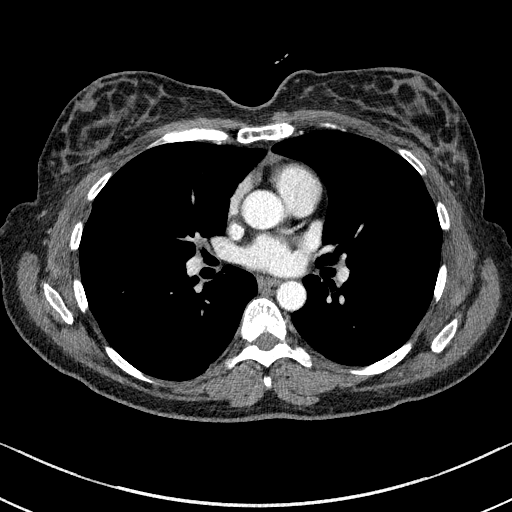
[im 92/166  lung]
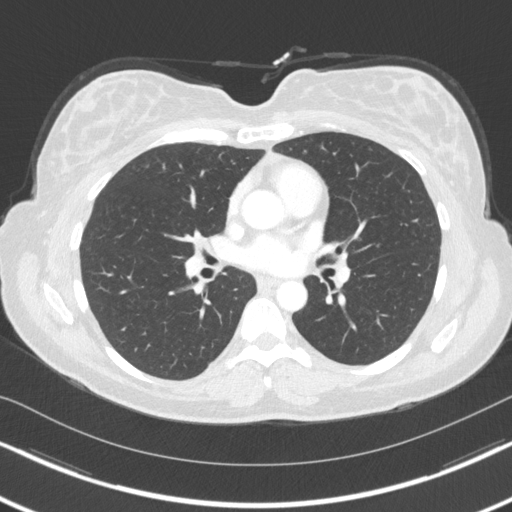
[im 100/166  lung]
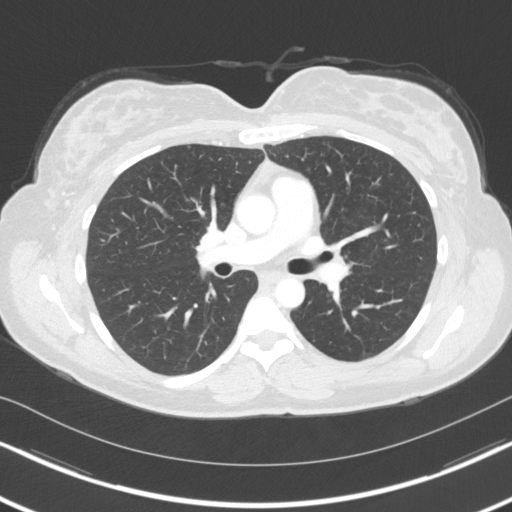
[im 111/166  lung]
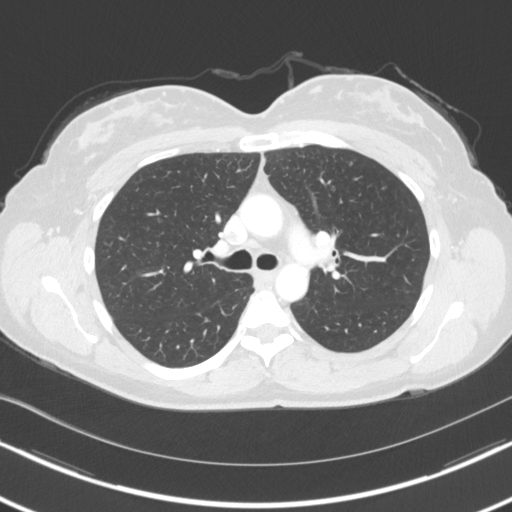
[im 123/166  lung]
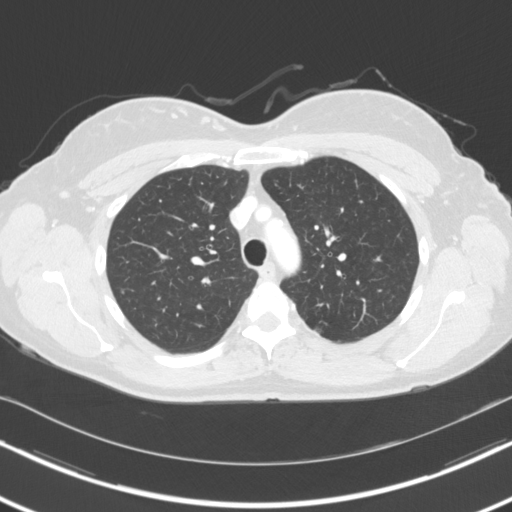
[im 133/166  mediastinal]
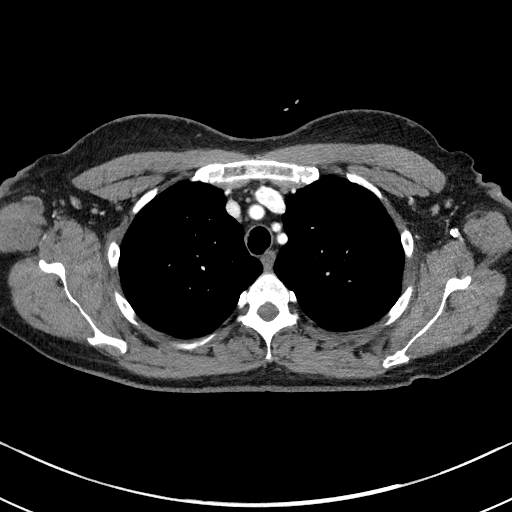
[im 133/166  lung]
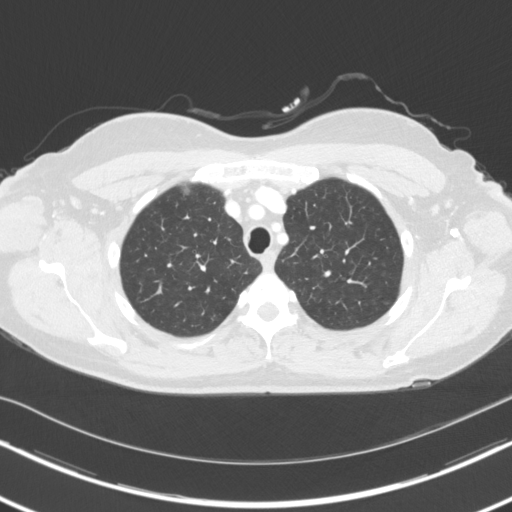
[im 141/166  lung]
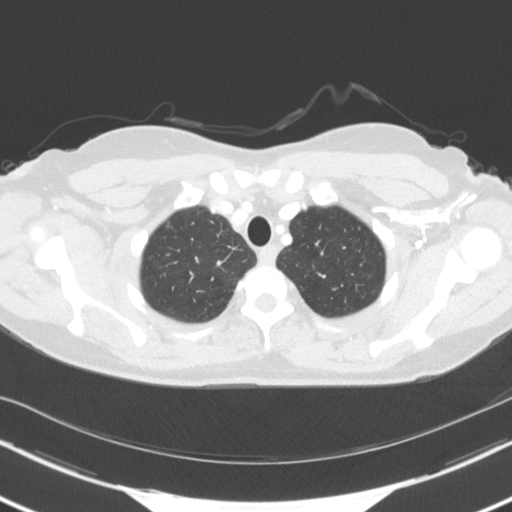
[im 153/166  lung]
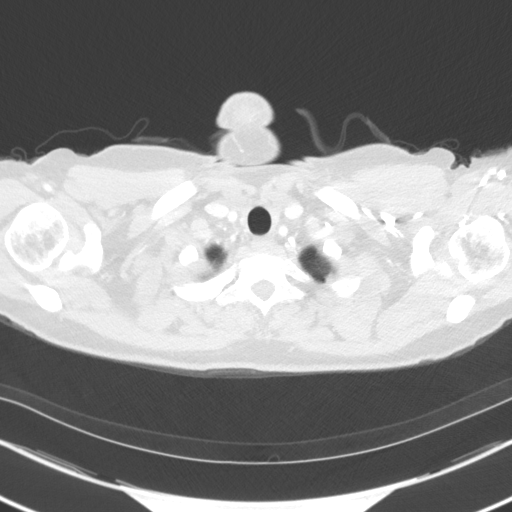

[15 of 34 positions shown; findings below may reference images not displayed]

FINDINGS: Cardiovascular: Heart is normal size. Aorta is normal caliber.
Scattered aortic arch and descending thoracic aortic calcifications.

Mediastinum/Nodes: No mediastinal, hilar, or axillary adenopathy.
Trachea and esophagus are unremarkable.

Lungs/Pleura: Lungs are clear. No focal airspace opacities. No
effusions. 6 mm subpleural nodule in the right lower lobe along the
major fissure is triangular in shape and most likely reflects
subpleural fibrosis. 2-3 other smaller scattered nodules noted in
the right lower lobe.

Upper Abdomen: Imaging into the upper abdomen shows no acute
findings.

Musculoskeletal: Chest wall soft tissues are unremarkable. No acute
bony abnormality.
IMPRESSION: No acute cardiopulmonary disease. No explanation for the patient's
chronic cough.

Small nodules in the right lower lobe. Non-contrast chest CT at 3-6
months is recommended. If the nodules are stable at time of repeat
CT, then future CT at 18-24 months (from today's scan) is considered
optional for low-risk patients, but is recommended for high-risk
patients. This recommendation follows the consensus statement:
Guidelines for Management of Incidental Pulmonary Nodules Detected
[DATE].

Early aortic atherosclerosis.

## 2019-07-09 ENCOUNTER — Other Ambulatory Visit: Payer: Self-pay

## 2019-07-09 DIAGNOSIS — R918 Other nonspecific abnormal finding of lung field: Secondary | ICD-10-CM

## 2019-08-27 ENCOUNTER — Ambulatory Visit: Payer: BLUE CROSS/BLUE SHIELD

## 2019-11-02 ENCOUNTER — Ambulatory Visit: Payer: BLUE CROSS/BLUE SHIELD

## 2019-11-15 ENCOUNTER — Ambulatory Visit: Payer: BLUE CROSS/BLUE SHIELD | Attending: Internal Medicine

## 2019-11-15 DIAGNOSIS — Z23 Encounter for immunization: Secondary | ICD-10-CM

## 2019-11-15 NOTE — Progress Notes (Signed)
   Covid-19 Vaccination Clinic  Name:  Marcia [name]    MRN: [id number] DOB: [birth year]  11/15/2019  [name] was observed post Covid-19 immunization for 15 minutes without incident. She was provided with Vaccine Information Sheet and instruction to access the V-Safe system.   [name] was instructed to call 911 with any severe reactions post vaccine: Marland Kitchen Difficulty breathing  . Swelling of face and throat  . A fast heartbeat  . A bad rash all over body  . Dizziness and weakness   Immunizations Administered    Name Date Dose VIS Date Route   Pfizer COVID-19 Vaccine 11/15/2019 10:03 AM 0.3 mL 07/18/2019 Intramuscular   Manufacturer: [place]   Lot: [id number]   [place]: [id number]

## 2019-11-26 ENCOUNTER — Encounter: Payer: Self-pay | Admitting: Obstetrics and Gynecology

## 2019-11-26 ENCOUNTER — Other Ambulatory Visit: Payer: Self-pay

## 2019-11-26 ENCOUNTER — Inpatient Hospital Stay: Payer: BLUE CROSS/BLUE SHIELD | Attending: Obstetrics and Gynecology | Admitting: Obstetrics and Gynecology

## 2019-11-26 VITALS — BP 115/56 | HR 90 | Temp 97.5°F | Resp 16 | Wt 162.7 lb

## 2019-11-26 DIAGNOSIS — Z90710 Acquired absence of both cervix and uterus: Secondary | ICD-10-CM | POA: Insufficient documentation

## 2019-11-26 DIAGNOSIS — Z87891 Personal history of nicotine dependence: Secondary | ICD-10-CM | POA: Diagnosis not present

## 2019-11-26 DIAGNOSIS — D071 Carcinoma in situ of vulva: Secondary | ICD-10-CM | POA: Insufficient documentation

## 2019-11-26 NOTE — Progress Notes (Signed)
Gynecologic Oncology Interval Note  Referring Provider: Dr. 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 female, diagnosed with VIN III, who returns to clinic today for continued management.   Stopped smoking recently.  No vulvar symptoms presently.  7/22 visit had colposcopy with small 3 mm vulvar lesion involving the right labia minora. Biopsy showed only hyperparakeratosis. Treated with clobetasol and symptoms resolved. No other complaints.    Treatment History:  Prior hysterectomy   08/2009     VIN II                 laser vaporization 03/2010     VIN II                 Aldara started,well tolerated with reduced frequency, 04/2011     infection peri-clitoral area, responding to antibiotic therapy 06/2012   recurrent VIN III on biopsy of left labia minora, PAP negative. 1/14     laser vaporization of raised epithelium consistent with VIN, mainly on the anterior portion of the labia minora. A few other scattered areas around the external genitalia.  04/07/15:  Had laser ablation in OR with Dr Theora Gianotti.  "Acetowhite epithelium involving bilateral labia minora of the vulva each approximately 3 cm. On the left the abnormalities extending to the medial aspect of the labia minora. There was a another patch of acetowhite on right vulva at 3 o'clock as well as another area at the perineum. There were scattered small 1-2 mm areas of acetowhite extending toward the anus."  02/14/17   Vulvar biopsies, right VIN2 grossly removed, left keratosis. DIAGNOSIS: VULVA, RIGHT ANTERIOR; BIOPSY:  - KERATOSIS AND MILD SQUAMOUS DYSPLASIA.  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  - A PERIPHERAL EDGE IS INVOLVED.   Seen in clinic on 04/11/17.  At that time, she had a new 89m wart near introitus at 7:00 which was removed.  Wet prep positive for yeast.  Negative for trichomoniasis and clue cells.    Pathology  04/11/2017:   DIAGNOSIS:  A. VULVA, RIGHT; BIOPSY/REMOVAL:  - LOW-GRADE SQUAMOUS INTRAEPITHELIAL LESION (CONDYLOMA).  - CANDIDIASIS.  - NEGATIVE FOR HIGH-GRADE SQUAMOUS INTRAEPITHELIAL LESION AND MALIGNANCY.  Comment: There are focal intraepithelial neutrophils, and PAS stain for fungi demonstrates a few intraepithelial pseudohyphae.   She was treated with Diflucan 150 mg daily X 3 days and used clobetasol topically for persistent symptoms.   She had her left ovary removed 05/2017 with Dr. HKenton Kingfisherd/t cyst and pain.   4/19 Pathology:  A. RIGHT VULVA; BIOPSY:  - HIGH-GRADE SQUAMOUS INTRAEPITHELIAL LESION (VIN III).  - A PERIPHERAL BIOPSY EDGE IS INVOLVED.  8/19 - normal exam and asymptomatic.   4/20 - telemedicine visit with no symptoms.  GYN History:   Gravida 3    Para 2    Age at Menarche 89   Regular Pap Smears Yes    History of HPV Infection    Cycle Normal       Additional Hx no other gyn problems in the past   Problem List: Patient Active Problem List   Diagnosis Date Noted  . Vulvar dermatitis 04/11/2019  . Right shoulder pain 09/03/2018  . Radiculopathy 04/18/2018  . Left lower quadrant pain 05/22/2017  . Ovarian cyst, complex 05/11/2017  . Leukocytosis 05/19/2016  . Erythrocytosis 05/19/2016  . Neck pain on left side 05/19/2016  . Vulvar intraepithelial neoplasia (VIN) grade 3 03/10/2015  .  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 Staphylococcal infection of skin 10/13/2014  . Furuncle of vulva 10/13/2014  . Hyperlipidemia due to type 1 diabetes mellitus ([place]) 09/12/2014  . Encounter for preventive health examination 09/12/2014  . Onychomycosis of toenail 09/10/2014  . Type 1 diabetes mellitus with neurological manifestations, uncontrolled ([place]) 02/10/2014  . Chronic cough 02/05/2014  . Carpal tunnel syndrome 12/17/2013  . Chronic bronchitis ([place]) 11/27/2013  . Unspecified constipation 11/11/2013  . Left wrist pain 11/07/2013  . Insomnia secondary to  anxiety 07/09/2013  . History of hyperthyroidism 07/01/2013  . Degenerative TFCC tear 05/23/2013  . Hyperthyroidism 04/28/2013  . Right wrist pain 04/28/2013  . Type I diabetes mellitus ([place]) 04/28/2013  . Snoring disorder 12/26/2012  . Tobacco abuse 05/22/2011  . Tobacco abuse counseling 05/22/2011  . ABNORMAL EKG 09/10/2009  . Dyslipidemia 04/02/2006    Past Medical History: Past Medical History:  Diagnosis Date  . Diabetes mellitus   . [place] fever    Allergies  . Headache    only from neck pain  . Heart murmur    at birth   . History of HPV infection   . Hypercholesterolemia   . Hypothyroidism   . Melanoma of skin ([place]) 2010   In her vaginal area with surgical resection.   . Myocardial infarction ([place]) 1997   At age 23  . Ovarian cyst 05/09/2017  . Radiculopathy of cervical region    B/L  . Thyroid disease   . Tobacco abuse   . VIN III (vulvar intraepithelial neoplasia III)   . Vulvar lesion   . Wears glasses     Past Surgical History: Past Surgical History:  Procedure Laterality Date  . ABDOMINAL HYSTERECTOMY  10/12/12  . ANKLE SURGERY Right 2010   Fractued ankle  . ANTERIOR CERVICAL DECOMPRESSION/DISCECTOMY FUSION 4 LEVELS N/A 04/18/2018   Procedure: ANTERIOR CERVICAL DECOMPRESSION FUSION. CERVICAL 4-5, CERVICAL 5-6, CERVICAL 6-7 WITH INSTRUMENTATION AND ALLOGRAFT.;  Surgeon: Phylliss Bob, MD;  Location: [facility];  Service: Orthopedics;  Laterality: N/A;  . BREAST BIOPSY Bilateral @ 2013   core with clips  . FLEXIBLE BRONCHOSCOPY N/A 05/30/2017   Procedure: FLEXIBLE BRONCHOSCOPY;  Surgeon: Laverle Hobby, MD;  Location: [facility];  Service: Pulmonary;  Laterality: N/A;  . LAPAROSCOPIC SALPINGO OOPHERECTOMY Left 06/14/2017   Procedure: LAPAROSCOPIC SALPINGO OOPHORECTOMY;  Surgeon: Gae Dry, MD;  Location: [facility];  Service: Gynecology;  Laterality: Left;  . SHOULDER SURGERY Right 09/2018  . TUBAL LIGATION  2004  . TUBAL LIGATION  2004, [facility]  .  VULVAR LESION REMOVAL  10/2009 & 05/2012  . VULVAR LESION REMOVAL N/A 04/07/2015   Procedure: VULVAR LESION;  Surgeon: Gillis Ends, MD;  Location: [facility];  Service: Gynecology;  Laterality: N/A;  with J-Plasma blade   Family History: Family History  Problem Relation Age of Onset  . Diabetes Father   . Hypertension Mother   . Hyperlipidemia Mother   . Diabetes Mother   . Aneurysm Brother   . Breast cancer Maternal Aunt 40       lung and ovarian   Social History: Social History   Socioeconomic History  . Marital status: Married    Spouse name: Not on file  . Number of children: Not on file  . Years of education: Not on file  . Highest education level: Not on file  Occupational History  . Not on file  Tobacco Use  . Smoking status: Former Smoker    Packs/day: 0.50  Years: 22.00    Pack years: 11.00    Types: Cigarettes    Quit date: 11/06/2019    Years since quitting: 0.0  . Smokeless tobacco: Never Used  Substance and Sexual Activity  . Alcohol use: Yes    Alcohol/week: 0.0 standard drinks    Comment: occasional beer  . Drug use: No  . Sexual activity: Yes    Birth control/protection: Surgical  Other Topics Concern  . Not on file  Social History Narrative   No regular exercise.   Lives with spouse.   Social Determinants of Health   Financial Resource Strain:   . Difficulty of Paying Living Expenses:   Food Insecurity:   . Worried About Charity fundraiser in the Last Year:   . Arboriculturist in the Last Year:   Transportation Needs:   . Film/video editor (Medical):   Marland Kitchen Lack of Transportation (Non-Medical):   Physical Activity:   . Days of Exercise per Week:   . Minutes of Exercise per Session:   Stress:   . Feeling of Stress :   Social Connections:   . Frequency of Communication with Friends and Family:   . Frequency of Social Gatherings with Friends and Family:   . Attends Religious Services:   . Active Member of Clubs or  Organizations:   . Attends Archivist Meetings:   Marland Kitchen Marital Status:   Intimate Partner Violence:   . Fear of Current or Ex-Partner:   . Emotionally Abused:   Marland Kitchen Physically Abused:   . Sexually Abused:     Allergies: Allergies  Allergen Reactions  . Chantix [Varenicline] Rash  . Septra [Sulfamethoxazole-Trimethoprim] Rash    Current Medications: Current Outpatient Medications  Medication Sig Dispense Refill  . ACCU-CHEK AVIVA PLUS test strip TEST 2 TIMES A DAY 100 each 6  . aspirin EC 81 MG tablet Take 81 mg by mouth daily.    Marland Kitchen atorvastatin (LIPITOR) 40 MG tablet Take 40 mg by mouth at bedtime.     . B-D ULTRAFINE III SHORT PEN 31G X 8 MM MISC USE TWO NEEDLES DAILY 100 each 8  . Blood Glucose Monitoring Suppl (ACCU-CHEK AVIVA PLUS) W/DEVICE KIT 1 Device by Does not apply route once. 1 kit 0  . canagliflozin (INVOKANA) 300 MG TABS tablet Take 300 mg by mouth daily before breakfast.    . ergocalciferol (VITAMIN D2) 50000 units capsule Take 50,000 Units by mouth every Saturday.     . insulin aspart (NOVOLOG FLEXPEN) 100 UNIT/ML FlexPen 4 units with breakfast, and 4 units with the evening meal. (Patient taking differently: Inject 2-12 Units into the skin 3 (three) times daily with meals. Per sliding scale) 15 mL 11  . Insulin Degludec (TRESIBA FLEXTOUCH) 200 UNIT/ML SOPN Inject 46 Units into the skin at bedtime.    Marland Kitchen levothyroxine (SYNTHROID, LEVOTHROID) 75 MCG tablet Take 75 mcg by mouth daily before breakfast.    . PARoxetine Mesylate (BRISDELLE) 7.5 MG CAPS Take 7.5 mg by mouth daily before breakfast.      No current facility-administered medications for this visit.   Facility-Administered Medications Ordered in Other Visits  Medication Dose Route Frequency Provider Last Rate Last Admin  . lidocaine (XYLOCAINE) 2 % jelly 1 application  1 application Topical QID PRN Gillis Ends, MD        Review of Systems General:  no complaints, no fever Skin: no  complaints Eyes: no complaints HEENT: no complaints Breasts: no complaints Pulmonary:  no complaints, no cough dyspnea.  Cardiac: no complaints Gastrointestinal: no complaints Genitourinary/Sexual: no complaints Ob/Gyn: no complaints Musculoskeletal: no complaints Hematology: no complaints Neurologic/Psych: no complaints   Objective:   Physical Examination:  There were no vitals filed for this visit. ECOG Performance Status: 0 asymptomatic Physical Exam General appearance: alert, cooperative and appears stated age 32: ATNC Lymph node survey: non-palpable inguinal nodes Abdomen: SNDNT.  No hernias, no masses, no ascites.  Extremities:No edema Neurological exam reveals alert, oriented, normal speech, no focal findings or movement disorder noted  Pelvic: chaperoned by NP; Vulva: no new lesions; Vagina: normal; Adnexa: negative for masses or nodularity; Uterus and Cervix: surgically absent; Rectal: deferred  Assessment:  Marcia [name] is a 49 y.o. female with a history of vulvar dysplasia VINIII s/p laser 04/07/15. Vulvar lesion biopsied 7/18 LGSIL.  Vulvar lesion biopsied off 4/19 VINIII and positive margin but no evidence of VIN on exam 8/19. Had some itching in 7/20 and biopsy negative.  She used clobetasol with resolution of symptoms.  She remains asymptomatic now.  Normal exam today.   Plan:   Problem List Items Addressed This Visit      Genitourinary   Vulvar intraepithelial neoplasia (VIN) grade 3 - Primary       We will see her in about 6 months for exam.  She can return sooner if any new symptoms.     I discussed the assessment and treatment plan with the patient. The patient was provided an opportunity to ask questions and all were answered. The patient agreed with the plan and demonstrated an understanding of the instructions.    Mellody Drown, MD  CC:  Danelle Berry, [place] [address] [place],  [place] [address] [phone]

## 2019-11-26 NOTE — Progress Notes (Signed)
Pt has no itching,irritation at all in vulvar

## 2019-11-30 LAB — IGP, APTIMA HPV: HPV Aptima: NEGATIVE

## 2019-12-10 ENCOUNTER — Ambulatory Visit: Payer: BLUE CROSS/BLUE SHIELD | Attending: Internal Medicine

## 2019-12-10 ENCOUNTER — Other Ambulatory Visit: Payer: Self-pay

## 2019-12-10 DIAGNOSIS — Z23 Encounter for immunization: Secondary | ICD-10-CM

## 2019-12-10 NOTE — Progress Notes (Signed)
   Covid-19 Vaccination Clinic  Name:  Marcia [name]    MRN: [id number] DOB: 1971-06-15  12/10/2019  Marcia [name] was observed post Covid-19 immunization for 15 minutes without incident. She was provided with Vaccine Information Sheet and instruction to access the V-Safe system.   Marcia [name] was instructed to call 911 with any severe reactions post vaccine: Marland Kitchen Difficulty breathing  . Swelling of face and throat  . A fast heartbeat  . A bad rash all over body  . Dizziness and weakness   Immunizations Administered    Name Date Dose VIS Date Route   Pfizer COVID-19 Vaccine 12/10/2019  3:31 PM 0.3 mL 10/01/2018 Intramuscular   Manufacturer: [employer], [employer]   Lot: [id number]   [place]: [id number]

## 2019-12-26 ENCOUNTER — Ambulatory Visit: Payer: Medicaid Other

## 2019-12-26 ENCOUNTER — Encounter: Payer: BLUE CROSS/BLUE SHIELD | Admitting: Podiatry

## 2019-12-26 ENCOUNTER — Other Ambulatory Visit: Payer: Self-pay

## 2020-01-13 NOTE — Progress Notes (Signed)
This encounter was created in error - please disregard.

## 2020-02-10 ENCOUNTER — Other Ambulatory Visit: Payer: Self-pay | Admitting: Nurse Practitioner

## 2020-02-10 DIAGNOSIS — Z1231 Encounter for screening mammogram for malignant neoplasm of breast: Secondary | ICD-10-CM

## 2020-02-24 ENCOUNTER — Ambulatory Visit
Admission: RE | Admit: 2020-02-24 | Discharge: 2020-02-24 | Disposition: A | Discharge status: Home / Self Care | Payer: BLUE CROSS/BLUE SHIELD | Source: Ambulatory Visit | Attending: Nurse Practitioner | Admitting: Nurse Practitioner

## 2020-02-24 DIAGNOSIS — Z1231 Encounter for screening mammogram for malignant neoplasm of breast: Secondary | ICD-10-CM | POA: Insufficient documentation

## 2020-04-06 ENCOUNTER — Encounter: Payer: Self-pay | Admitting: Nurse Practitioner

## 2020-04-13 ENCOUNTER — Telehealth: Payer: Self-pay | Admitting: Nurse Practitioner

## 2020-04-13 NOTE — Telephone Encounter (Signed)
Spoke to patient regarding [employer]. From perspective of her history of VIN, hormonal treatment for sexual dysfunction wouldn't be a contraindication. May want to discuss other risks vs benefits of hormonal therapies as well as alternative options. I recommended that she speak with her gynecologist, Dr. Kenton Kingfisher for this. She is in agreement and will contact Dr. Kenton Kingfisher. If questions or concerns though I am happy to see her at the [facility] to discuss as well.

## 2020-05-26 ENCOUNTER — Inpatient Hospital Stay: Payer: BLUE CROSS/BLUE SHIELD

## 2020-06-16 ENCOUNTER — Inpatient Hospital Stay: Payer: BLUE CROSS/BLUE SHIELD | Attending: Obstetrics and Gynecology | Admitting: Obstetrics and Gynecology

## 2020-06-16 VITALS — BP 104/66 | HR 90 | Temp 98.0°F | Resp 20 | Wt 126.4 lb

## 2020-06-16 DIAGNOSIS — A630 Anogenital (venereal) warts: Secondary | ICD-10-CM | POA: Diagnosis not present

## 2020-06-16 DIAGNOSIS — E119 Type 2 diabetes mellitus without complications: Secondary | ICD-10-CM | POA: Diagnosis not present

## 2020-06-16 DIAGNOSIS — Z79899 Other long term (current) drug therapy: Secondary | ICD-10-CM | POA: Diagnosis not present

## 2020-06-16 DIAGNOSIS — E039 Hypothyroidism, unspecified: Secondary | ICD-10-CM | POA: Diagnosis not present

## 2020-06-16 DIAGNOSIS — E785 Hyperlipidemia, unspecified: Secondary | ICD-10-CM | POA: Insufficient documentation

## 2020-06-16 DIAGNOSIS — Z87891 Personal history of nicotine dependence: Secondary | ICD-10-CM | POA: Diagnosis not present

## 2020-06-16 DIAGNOSIS — Z7982 Long term (current) use of aspirin: Secondary | ICD-10-CM | POA: Diagnosis not present

## 2020-06-16 DIAGNOSIS — I252 Old myocardial infarction: Secondary | ICD-10-CM | POA: Insufficient documentation

## 2020-06-16 DIAGNOSIS — E7800 Pure hypercholesterolemia, unspecified: Secondary | ICD-10-CM | POA: Insufficient documentation

## 2020-06-16 DIAGNOSIS — D071 Carcinoma in situ of vulva: Secondary | ICD-10-CM | POA: Diagnosis not present

## 2020-06-16 NOTE — Patient Instructions (Signed)
At this time you can be released from the cancer center. Please follow up with Dr. Kenton Kingfisher at [facility] in the next 6-12 months.

## 2020-06-16 NOTE — Progress Notes (Signed)
Gynecologic Oncology Interval Note  Referring Provider: Dr. 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 female, diagnosed with VIN III, s/p laser in 2016 who returns to clinic for continued surveillance.   No vulvar symptoms today.   Treatment History:  Prior hysterectomy   08/2009     VIN II                 laser vaporization 03/2010     VIN II                 Aldara started,well tolerated with reduced frequency, 04/2011     infection peri-clitoral area, responding to antibiotic therapy 06/2012   recurrent VIN III on biopsy of left labia minora, PAP negative. 1/14     laser vaporization of raised epithelium consistent with VIN, mainly on the anterior portion of the labia minora. A few other scattered areas around the external genitalia.  04/07/15:  Had laser ablation in OR with Dr Theora Gianotti.  "Acetowhite epithelium involving bilateral labia minora of the vulva each approximately 3 cm. On the left the abnormalities extending to the medial aspect of the labia minora. There was a another patch of acetowhite on right vulva at 3 o'clock as well as another area at the perineum. There were scattered small 1-2 mm areas of acetowhite extending toward the anus."  02/14/17   Vulvar biopsies, right VIN2 grossly removed, left keratosis. DIAGNOSIS: VULVA, RIGHT ANTERIOR; BIOPSY:  - KERATOSIS AND MILD SQUAMOUS DYSPLASIA.  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  - A PERIPHERAL EDGE IS INVOLVED.   Seen in clinic on 04/11/17.  At that time, she had a new 15m wart near introitus at 7:00 which was removed.  Wet prep positive for yeast.  Negative for trichomoniasis and clue cells.    Pathology  04/11/2017:  DIAGNOSIS:  A. VULVA, RIGHT; BIOPSY/REMOVAL:  - LOW-GRADE SQUAMOUS INTRAEPITHELIAL LESION (CONDYLOMA).  - CANDIDIASIS.  - NEGATIVE FOR HIGH-GRADE SQUAMOUS INTRAEPITHELIAL LESION AND MALIGNANCY.  Comment: There are  focal intraepithelial neutrophils, and PAS stain for fungi demonstrates a few intraepithelial pseudohyphae.   She was treated with Diflucan 150 mg daily X 3 days and used clobetasol topically for persistent symptoms.   She had her left ovary removed 05/2017 with Dr. HKenton Kingfisherd/t cyst and pain.   4/19 Pathology:  A. RIGHT VULVA; BIOPSY:  - HIGH-GRADE SQUAMOUS INTRAEPITHELIAL LESION (VIN III).  - A PERIPHERAL BIOPSY EDGE IS INVOLVED.  8/19 - normal exam and asymptomatic.   4/20 - telemedicine visit with no symptoms.  7/20 visit had colposcopy with small 3 mm vulvar lesion involving the right labia minora. Biopsy showed only hyperparakeratosis. Treated with clobetasol and symptoms resolved.  Stopped smoking.  GYN History:   Gravida 3    Para 2    Age at Menarche 89   Regular Pap Smears Yes    History of HPV Infection    Cycle Normal       Additional Hx no other gyn problems in the past   Problem List: Patient Active Problem List   Diagnosis Date Noted  . Vulvar dermatitis 04/11/2019  . Right shoulder pain 09/03/2018  . Radiculopathy 04/18/2018  . Left lower quadrant pain 05/22/2017  . Ovarian cyst, complex 05/11/2017  . Leukocytosis 05/19/2016  . Erythrocytosis 05/19/2016  . Neck pain on left side 05/19/2016  . Vulvar intraepithelial neoplasia (VIN) grade 3 03/10/2015  . Allergy  to antibacterial drug 10/20/2014  . Staphylococcal infection of skin 10/13/2014  . Furuncle of vulva 10/13/2014  . Hyperlipidemia due to type 1 diabetes mellitus ([place]) 09/12/2014  . Encounter for preventive health examination 09/12/2014  . Onychomycosis of toenail 09/10/2014  . Type 1 diabetes mellitus with neurological manifestations, uncontrolled ([place]) 02/10/2014  . Chronic cough 02/05/2014  . Carpal tunnel syndrome 12/17/2013  . Chronic bronchitis ([place]) 11/27/2013  . Unspecified constipation 11/11/2013  . Left wrist pain 11/07/2013  . Insomnia secondary to anxiety 07/09/2013  .  History of hyperthyroidism 07/01/2013  . Degenerative TFCC tear 05/23/2013  . Hyperthyroidism 04/28/2013  . Right wrist pain 04/28/2013  . Type I diabetes mellitus ([place]) 04/28/2013  . Snoring disorder 12/26/2012  . Tobacco abuse 05/22/2011  . Tobacco abuse counseling 05/22/2011  . ABNORMAL EKG 09/10/2009  . Dyslipidemia 04/02/2006    Past Medical History: Past Medical History:  Diagnosis Date  . Diabetes mellitus   . Hay fever    Allergies  . Headache    only from neck pain  . Heart murmur    at birth   . History of HPV infection   . Hypercholesterolemia   . Hypothyroidism   . Melanoma of skin ([place]) 2010   In her vaginal area with surgical resection.   . Myocardial infarction ([place]) 1997   At age 21  . Ovarian cyst 05/09/2017  . Radiculopathy of cervical region    B/L  . Thyroid disease   . Tobacco abuse   . VIN III (vulvar intraepithelial neoplasia III)   . Vulvar lesion   . Wears glasses     Past Surgical History: Past Surgical History:  Procedure Laterality Date  . ABDOMINAL HYSTERECTOMY  10/12/12  . ANKLE SURGERY Right 2010   Fractued ankle  . ANTERIOR CERVICAL DECOMPRESSION/DISCECTOMY FUSION 4 LEVELS N/A 04/18/2018   Procedure: ANTERIOR CERVICAL DECOMPRESSION FUSION. CERVICAL 4-5, CERVICAL 5-6, CERVICAL 6-7 WITH INSTRUMENTATION AND ALLOGRAFT.;  Surgeon: Phylliss Bob, MD;  Location: [facility];  Service: Orthopedics;  Laterality: N/A;  . BREAST BIOPSY Bilateral @ 2013   core with clips  . FLEXIBLE BRONCHOSCOPY N/A 05/30/2017   Procedure: FLEXIBLE BRONCHOSCOPY;  Surgeon: Laverle Hobby, MD;  Location: [facility];  Service: Pulmonary;  Laterality: N/A;  . LAPAROSCOPIC SALPINGO OOPHERECTOMY Left 06/14/2017   Procedure: LAPAROSCOPIC SALPINGO OOPHORECTOMY;  Surgeon: Gae Dry, MD;  Location: [facility];  Service: Gynecology;  Laterality: Left;  . SHOULDER SURGERY Right 09/2018  . TUBAL LIGATION  2004  . TUBAL LIGATION  2004, [facility]  . VULVAR LESION REMOVAL   10/2009 & 05/2012  . VULVAR LESION REMOVAL N/A 04/07/2015   Procedure: VULVAR LESION;  Surgeon: Gillis Ends, MD;  Location: [facility];  Service: Gynecology;  Laterality: N/A;  with J-Plasma blade   Family History: Family History  Problem Relation Age of Onset  . Diabetes Father   . Hypertension Mother   . Hyperlipidemia Mother   . Diabetes Mother   . Aneurysm Brother   . Breast cancer Maternal Aunt 40       lung and ovarian   Social History: Social History   Socioeconomic History  . Marital status: Married    Spouse name: Not on file  . Number of children: Not on file  . Years of education: Not on file  . Highest education level: Not on file  Occupational History  . Not on file  Tobacco Use  . Smoking status: Former Smoker    Packs/day: 0.50  Years: 22.00    Pack years: 11.00    Types: Cigarettes    Quit date: 11/06/2019    Years since quitting: 0.6  . Smokeless tobacco: Never Used  Vaping Use  . Vaping Use: Never used  Substance and Sexual Activity  . Alcohol use: Yes    Alcohol/week: 0.0 standard drinks    Comment: occasional beer  . Drug use: No  . Sexual activity: Yes    Birth control/protection: Surgical  Other Topics Concern  . Not on file  Social History Narrative   No regular exercise.   Lives with spouse.   Social Determinants of Health   Financial Resource Strain:   . Difficulty of Paying Living Expenses: Not on file  Food Insecurity:   . Worried About Charity fundraiser in the Last Year: Not on file  . Ran Out of Food in the Last Year: Not on file  Transportation Needs:   . Lack of Transportation (Medical): Not on file  . Lack of Transportation (Non-Medical): Not on file  Physical Activity:   . Days of Exercise per Week: Not on file  . Minutes of Exercise per Session: Not on file  Stress:   . Feeling of Stress : Not on file  Social Connections:   . Frequency of Communication with Friends and Family: Not on file  . Frequency of  Social Gatherings with Friends and Family: Not on file  . Attends Religious Services: Not on file  . Active Member of Clubs or Organizations: Not on file  . Attends Archivist Meetings: Not on file  . Marital Status: Not on file  Intimate Partner Violence:   . Fear of Current or Ex-Partner: Not on file  . Emotionally Abused: Not on file  . Physically Abused: Not on file  . Sexually Abused: Not on file    Allergies: Allergies  Allergen Reactions  . Chantix [Varenicline] Rash  . Septra [Sulfamethoxazole-Trimethoprim] Rash    Current Medications: Current Outpatient Medications  Medication Sig Dispense Refill  . ACCU-CHEK AVIVA PLUS test strip TEST 2 TIMES A DAY 100 each 6  . aspirin EC 81 MG tablet Take 81 mg by mouth daily.    Marland Kitchen atorvastatin (LIPITOR) 40 MG tablet Take 40 mg by mouth at bedtime.     . B-D ULTRAFINE III SHORT PEN 31G X 8 MM MISC USE TWO NEEDLES DAILY 100 each 8  . Blood Glucose Monitoring Suppl (ACCU-CHEK AVIVA PLUS) W/DEVICE KIT 1 Device by Does not apply route once. 1 kit 0  . canagliflozin (INVOKANA) 300 MG TABS tablet Take 300 mg by mouth daily before breakfast.    . ergocalciferol (VITAMIN D2) 50000 units capsule Take 50,000 Units by mouth every Saturday.     . insulin aspart (NOVOLOG FLEXPEN) 100 UNIT/ML FlexPen 4 units with breakfast, and 4 units with the evening meal. (Patient taking differently: Inject 2-12 Units into the skin 3 (three) times daily with meals. Per sliding scale) 15 mL 11  . Insulin Degludec (TRESIBA FLEXTOUCH) 200 UNIT/ML SOPN Inject 46 Units into the skin at bedtime.    Marland Kitchen levothyroxine (SYNTHROID, LEVOTHROID) 75 MCG tablet Take 75 mcg by mouth daily before breakfast.    . PARoxetine Mesylate (BRISDELLE) 7.5 MG CAPS Take 7.5 mg by mouth daily before breakfast.      No current facility-administered medications for this visit.   Facility-Administered Medications Ordered in Other Visits  Medication Dose Route Frequency Provider Last  Rate Last Admin  . lidocaine (  XYLOCAINE) 2 % jelly 1 application  1 application Topical QID PRN Gillis Ends, MD       Review of Systems General:  no complaints Skin: no complaints Eyes: no complaints HEENT: no complaints Breasts: no complaints Pulmonary: no complaints Cardiac: no complaints Gastrointestinal: no complaints Genitourinary/Sexual: no complaints Ob/Gyn: no complaints Musculoskeletal: no complaints Hematology: no complaints Neurologic/Psych: no complaints  Objective:  Physical Examination:  Vitals:   06/16/20 1259  BP: 104/66  Pulse: 90  Resp: 20  Temp: 98 F (36.7 C)  SpO2: 98%  ECOG Performance Status: 0 asymptomatic  GENERAL: Patient is a well appearing female in no acute distress HEENT:  Sclera clear. Anicteric NODES:  Negative axillary, supraclavicular, inguinal lymph node survery LUNGS:  Clear to auscultation bilaterally.   HEART:  Regular rate and rhythm.  ABDOMEN:  Soft, nontender.  No masses or ascites EXTREMITIES:  No peripheral edema. Atraumatic. No cyanosis SKIN:  Clear with no obvious rashes or skin changes.  NEURO:  Nonfocal. Well oriented.  Appropriate affect.  Pelvic: chaperoned by NP; Vulva: dry and atrophic, but no new lesions; Vagina: normal; Adnexa: negative for masses or nodularity; Uterus and Cervix: surgically absent; Rectal: deferred  Assessment:  Marcia [name] is a 49 y.o. female with a history of vulvar dysplasia VINIII s/p laser 04/07/15. Vulvar lesion biopsied 7/18 [facility].  Vulvar lesion biopsied off 4/19 VINIII and positive margin but no evidence of VIN on exam 8/19. Had some itching in 7/20 and biopsy negative.  She used clobetasol with resolution of symptoms.  She remains asymptomatic now.  Normal exam today.   Prior hysterectomy 2014. PAP/HPV negative 4/21.  Plan:   Problem List Items Addressed This Visit      Genitourinary   Vulvar intraepithelial neoplasia (VIN) grade 3    Other Visit Diagnoses    VIN  III (vulvar intraepithelial neoplasia III)    -  Primary       She has not had any problems with VIN recently and has been asymptomatic, so will go back to Dr Kenton Kingfisher at [facility] in about 6-12 months for exam.  She can return to see [place] in the future if Dr Kenton Kingfisher requests.      I discussed the assessment and treatment plan with the patient. The patient was provided an opportunity to ask questions and all were answered. The patient agreed with the plan and demonstrated an understanding of the instructions.  Verlon Au, NP  I personally interviewed and examined the patient. Agreed with the above/below plan of care. I have directly contributed to assessment and plan of care of this patient and educated and discussed with patient and family.  Mellody Drown, MD  CC:  Danelle Berry, [facility] [address] [place],  [place] [address] [phone]

## 2020-10-13 DIAGNOSIS — M65311 Trigger thumb, right thumb: Secondary | ICD-10-CM | POA: Insufficient documentation

## 2021-01-20 ENCOUNTER — Other Ambulatory Visit: Payer: Self-pay | Admitting: Nurse Practitioner

## 2021-01-20 DIAGNOSIS — Z1231 Encounter for screening mammogram for malignant neoplasm of breast: Secondary | ICD-10-CM

## 2021-02-24 ENCOUNTER — Other Ambulatory Visit: Payer: Self-pay

## 2021-02-24 ENCOUNTER — Ambulatory Visit
Admission: RE | Admit: 2021-02-24 | Discharge: 2021-02-24 | Disposition: A | Discharge status: Home / Self Care | Payer: 59 | Source: Ambulatory Visit | Attending: Nurse Practitioner | Admitting: Nurse Practitioner

## 2021-02-24 DIAGNOSIS — Z1231 Encounter for screening mammogram for malignant neoplasm of breast: Secondary | ICD-10-CM | POA: Diagnosis not present

## 2021-05-09 NOTE — Progress Notes (Deleted)
Danelle Berry, NP   No chief complaint on file.   HPI:      [name] is a 50 y.o. [id number] whose LMP was Patient's last menstrual period was 10/12/2012., presents today for NP> 3 yrs vag itch    Past Medical History:  Diagnosis Date   Diabetes mellitus    Hay fever    Allergies   Headache    only from neck pain   Heart murmur    at birth    History of HPV infection    Hypercholesterolemia    Hypothyroidism    Melanoma of skin ([place]) 2010   In her vaginal area with surgical resection.    Myocardial infarction [facility]) 1997   At age 42   Ovarian cyst 05/09/2017   Radiculopathy of cervical region    B/L   Thyroid disease    Tobacco abuse    VIN III (vulvar intraepithelial neoplasia III)    Vulvar lesion    Wears glasses     Past Surgical History:  Procedure Laterality Date   ABDOMINAL HYSTERECTOMY  10/12/12   ANKLE SURGERY Right 2010   Fractued ankle   ANTERIOR CERVICAL DECOMPRESSION/DISCECTOMY FUSION 4 LEVELS N/A 04/18/2018   Procedure: ANTERIOR CERVICAL DECOMPRESSION FUSION. CERVICAL 4-5, CERVICAL 5-6, CERVICAL 6-7 WITH INSTRUMENTATION AND ALLOGRAFT.;  Surgeon: Phylliss Bob, MD;  Location: [facility];  Service: Orthopedics;  Laterality: N/A;   BREAST BIOPSY Bilateral @ 2013   core with clips   FLEXIBLE BRONCHOSCOPY N/A 05/30/2017   Procedure: FLEXIBLE BRONCHOSCOPY;  Surgeon: Laverle Hobby, MD;  Location: [facility];  Service: Pulmonary;  Laterality: N/A;   LAPAROSCOPIC SALPINGO OOPHERECTOMY Left 06/14/2017   Procedure: LAPAROSCOPIC SALPINGO OOPHORECTOMY;  Surgeon: Gae Dry, MD;  Location: [facility];  Service: Gynecology;  Laterality: Left;   SHOULDER SURGERY Right 09/2018   TUBAL LIGATION  2004   TUBAL LIGATION  2004, [facility]   VULVAR LESION REMOVAL  10/2009 & 05/2012   VULVAR LESION REMOVAL N/A 04/07/2015   Procedure: VULVAR LESION;  Surgeon: Gillis Ends, MD;  Location: [facility];  Service: Gynecology;  Laterality: N/A;  with J-Plasma  blade    Family History  Problem Relation Age of Onset   Diabetes Father    Hypertension Mother    Hyperlipidemia Mother    Diabetes Mother    Aneurysm Brother    Breast cancer Maternal Aunt 40       lung and ovarian    Social History   Socioeconomic History   Marital status: Married    Spouse name: Not on file   Number of children: Not on file   Years of education: Not on file   Highest education level: Not on file  Occupational History   Not on file  Tobacco Use   Smoking status: Former    Packs/day: 0.50    Years: 22.00    Pack years: 11.00    Types: Cigarettes    Quit date: 11/06/2019    Years since quitting: 1.5   Smokeless tobacco: Never  Vaping Use   Vaping Use: Never used  Substance and Sexual Activity   Alcohol use: Yes    Alcohol/week: 0.0 standard drinks    Comment: occasional beer   Drug use: No   Sexual activity: Yes    Birth control/protection: Surgical  Other Topics Concern   Not on file  Social History Narrative   No regular exercise.   Lives with spouse.   Social Determinants  of Health   Financial Resource Strain: Not on file  Food Insecurity: Not on file  Transportation Needs: Not on file  Physical Activity: Not on file  Stress: Not on file  Social Connections: Not on file  Intimate Partner Violence: Not on file    Outpatient Medications Prior to Visit  Medication Sig Dispense Refill   ACCU-CHEK AVIVA PLUS test strip TEST 2 TIMES A DAY 100 each 6   aspirin EC 81 MG tablet Take 81 mg by mouth daily.     atorvastatin (LIPITOR) 40 MG tablet Take 40 mg by mouth at bedtime.      B-D ULTRAFINE III SHORT PEN 31G X 8 MM MISC USE TWO NEEDLES DAILY 100 each 8   Blood Glucose Monitoring Suppl (ACCU-CHEK AVIVA PLUS) W/DEVICE KIT 1 Device by Does not apply route once. 1 kit 0   canagliflozin (INVOKANA) 300 MG TABS tablet Take 300 mg by mouth daily before breakfast.     ergocalciferol (VITAMIN D2) 50000 units capsule Take 50,000 Units by mouth  every Saturday.      insulin aspart (NOVOLOG FLEXPEN) 100 UNIT/ML FlexPen 4 units with breakfast, and 4 units with the evening meal. (Patient taking differently: Inject 2-12 Units into the skin 3 (three) times daily with meals. Per sliding scale) 15 mL 11   Insulin Degludec (TRESIBA FLEXTOUCH) 200 UNIT/ML SOPN Inject 46 Units into the skin at bedtime.     levothyroxine (SYNTHROID, LEVOTHROID) 75 MCG tablet Take 75 mcg by mouth daily before breakfast.     PARoxetine Mesylate (BRISDELLE) 7.5 MG CAPS Take 7.5 mg by mouth daily before breakfast.      Facility-Administered Medications Prior to Visit  Medication Dose Route Frequency Provider Last Rate Last Admin   lidocaine (XYLOCAINE) 2 % jelly 1 application  1 application Topical QID PRN Gillis Ends, MD          ROS:  Review of Systems BREAST: No symptoms   OBJECTIVE:   Vitals:  LMP 10/12/2012   Physical Exam  Results: No results found for this or any previous visit (from the past 24 hour(s)).   Assessment/Plan: No diagnosis found.    No orders of the defined types were placed in this encounter.     No follow-ups on file.  Alhaji Mcneal B. Mahnoor Mathisen, PA-C 05/09/2021 4:08 PM

## 2021-05-10 ENCOUNTER — Ambulatory Visit: Payer: 59 | Admitting: Obstetrics and Gynecology

## 2021-05-17 ENCOUNTER — Ambulatory Visit: Payer: 59 | Admitting: Podiatry

## 2021-05-22 ENCOUNTER — Inpatient Hospital Stay (HOSPITAL_COMMUNITY)
Admission: EM | Admit: 2021-05-22 | Discharge: 2021-05-25 | DRG: 637 | Disposition: A | Discharge status: Home / Self Care | Payer: Medicaid Other | Attending: Internal Medicine | Admitting: Internal Medicine

## 2021-05-22 ENCOUNTER — Emergency Department (HOSPITAL_COMMUNITY): Payer: Medicaid Other

## 2021-05-22 ENCOUNTER — Other Ambulatory Visit: Payer: Self-pay

## 2021-05-22 DIAGNOSIS — N289 Disorder of kidney and ureter, unspecified: Secondary | ICD-10-CM

## 2021-05-22 DIAGNOSIS — G9340 Encephalopathy, unspecified: Secondary | ICD-10-CM

## 2021-05-22 DIAGNOSIS — E1111 Type 2 diabetes mellitus with ketoacidosis with coma: Secondary | ICD-10-CM

## 2021-05-22 DIAGNOSIS — E1110 Type 2 diabetes mellitus with ketoacidosis without coma: Secondary | ICD-10-CM | POA: Diagnosis present

## 2021-05-22 DIAGNOSIS — E872 Acidosis, unspecified: Secondary | ICD-10-CM

## 2021-05-22 DIAGNOSIS — E039 Hypothyroidism, unspecified: Secondary | ICD-10-CM | POA: Diagnosis present

## 2021-05-22 DIAGNOSIS — Z79899 Other long term (current) drug therapy: Secondary | ICD-10-CM | POA: Diagnosis not present

## 2021-05-22 DIAGNOSIS — R4182 Altered mental status, unspecified: Secondary | ICD-10-CM | POA: Diagnosis present

## 2021-05-22 DIAGNOSIS — D72829 Elevated white blood cell count, unspecified: Secondary | ICD-10-CM | POA: Diagnosis present

## 2021-05-22 DIAGNOSIS — Z20822 Contact with and (suspected) exposure to covid-19: Secondary | ICD-10-CM | POA: Diagnosis present

## 2021-05-22 DIAGNOSIS — Z888 Allergy status to other drugs, medicaments and biological substances status: Secondary | ICD-10-CM | POA: Diagnosis not present

## 2021-05-22 DIAGNOSIS — M1990 Unspecified osteoarthritis, unspecified site: Secondary | ICD-10-CM | POA: Diagnosis present

## 2021-05-22 DIAGNOSIS — Z87898 Personal history of other specified conditions: Secondary | ICD-10-CM

## 2021-05-22 DIAGNOSIS — G928 Other toxic encephalopathy: Secondary | ICD-10-CM | POA: Diagnosis present

## 2021-05-22 DIAGNOSIS — Z79890 Hormone replacement therapy (postmenopausal): Secondary | ICD-10-CM

## 2021-05-22 DIAGNOSIS — E785 Hyperlipidemia, unspecified: Secondary | ICD-10-CM | POA: Diagnosis present

## 2021-05-22 DIAGNOSIS — Z7984 Long term (current) use of oral hypoglycemic drugs: Secondary | ICD-10-CM | POA: Diagnosis not present

## 2021-05-22 DIAGNOSIS — F32A Depression, unspecified: Secondary | ICD-10-CM | POA: Diagnosis present

## 2021-05-22 DIAGNOSIS — E876 Hypokalemia: Secondary | ICD-10-CM | POA: Diagnosis not present

## 2021-05-22 DIAGNOSIS — I959 Hypotension, unspecified: Secondary | ICD-10-CM | POA: Diagnosis not present

## 2021-05-22 DIAGNOSIS — N179 Acute kidney failure, unspecified: Secondary | ICD-10-CM | POA: Diagnosis present

## 2021-05-22 DIAGNOSIS — Z853 Personal history of malignant neoplasm of breast: Secondary | ICD-10-CM | POA: Diagnosis not present

## 2021-05-22 DIAGNOSIS — E874 Mixed disorder of acid-base balance: Secondary | ICD-10-CM | POA: Diagnosis present

## 2021-05-22 DIAGNOSIS — G9341 Metabolic encephalopathy: Secondary | ICD-10-CM | POA: Diagnosis not present

## 2021-05-22 LAB — URINALYSIS, ROUTINE W REFLEX MICROSCOPIC
Bilirubin Urine: NEGATIVE
Glucose, UA: >=500 mg/dL — AB
Ketones, ur: 80 mg/dL — AB
Leukocytes,Ua: NEGATIVE
Nitrite: NEGATIVE
Protein, ur: 100 mg/dL — AB
Specific Gravity, Urine: 1.023 (ref 1.005–1.030)
pH: 5 (ref 5.0–8.0)

## 2021-05-22 LAB — I-STAT VENOUS BLOOD GAS, ED
Acid-base deficit: 30 mmol/L — ABNORMAL HIGH (ref 0.0–2.0)
Acid-base deficit: 30 mmol/L — ABNORMAL HIGH (ref 0.0–2.0)
Bicarbonate: 3.1 mmol/L — ABNORMAL LOW (ref 20.0–28.0)
Bicarbonate: 3.3 mmol/L — ABNORMAL LOW (ref 20.0–28.0)
Calcium, Ion: 1.11 mmol/L — ABNORMAL LOW (ref 1.15–1.40)
Calcium, Ion: 1.11 mmol/L — ABNORMAL LOW (ref 1.15–1.40)
HCT: 49 % — ABNORMAL HIGH (ref 36.0–46.0)
HCT: 41 % (ref 36.0–46.0)
Hemoglobin: 16.7 g/dL — ABNORMAL HIGH (ref 12.0–15.0)
Hemoglobin: 13.9 g/dL (ref 12.0–15.0)
O2 Saturation: 52 %
O2 Saturation: 75 %
Potassium: 5 mmol/L (ref 3.5–5.1)
Potassium: 5.2 mmol/L — ABNORMAL HIGH (ref 3.5–5.1)
Sodium: 133 mmol/L — ABNORMAL LOW (ref 135–145)
Sodium: 135 mmol/L (ref 135–145)
TCO2: <5 mmol/L — ABNORMAL LOW (ref 22–32)
TCO2: <5 mmol/L — ABNORMAL LOW (ref 22–32)
pCO2, Ven: 22.6 mmHg — ABNORMAL LOW (ref 44.0–60.0)
pCO2, Ven: 20.9 mmHg — ABNORMAL LOW (ref 44.0–60.0)
pH, Ven: 6.75 — CL (ref 7.250–7.430)
pH, Ven: 6.806 — CL (ref 7.250–7.430)
pO2, Ven: 53 mmHg — ABNORMAL HIGH (ref 32.0–45.0)
pO2, Ven: 72 mmHg — ABNORMAL HIGH (ref 32.0–45.0)

## 2021-05-22 LAB — CBC WITH DIFFERENTIAL/PLATELET
Abs Immature Granulocytes: 0.29 10*3/uL — ABNORMAL HIGH (ref 0.00–0.07)
Abs Immature Granulocytes: 0.39 10*3/uL — ABNORMAL HIGH (ref 0.00–0.07)
Basophils Absolute: 0.2 10*3/uL — ABNORMAL HIGH (ref 0.0–0.1)
Basophils Absolute: 0.1 10*3/uL (ref 0.0–0.1)
Basophils Relative: 1 %
Basophils Relative: 0 %
Eosinophils Absolute: 0 10*3/uL (ref 0.0–0.5)
Eosinophils Absolute: 0 10*3/uL (ref 0.0–0.5)
Eosinophils Relative: 0 %
Eosinophils Relative: 0 %
HCT: 50.8 % — ABNORMAL HIGH (ref 36.0–46.0)
HCT: 42.8 % (ref 36.0–46.0)
Hemoglobin: 15.7 g/dL — ABNORMAL HIGH (ref 12.0–15.0)
Hemoglobin: 13.6 g/dL (ref 12.0–15.0)
Immature Granulocytes: 1 %
Immature Granulocytes: 1 %
Lymphocytes Relative: 18 %
Lymphocytes Relative: 13 %
Lymphs Abs: 5 10*3/uL — ABNORMAL HIGH (ref 0.7–4.0)
Lymphs Abs: 3.6 10*3/uL (ref 0.7–4.0)
MCH: 31.8 pg (ref 26.0–34.0)
MCH: 32.2 pg (ref 26.0–34.0)
MCHC: 30.9 g/dL (ref 30.0–36.0)
MCHC: 31.8 g/dL (ref 30.0–36.0)
MCV: 103 fL — ABNORMAL HIGH (ref 80.0–100.0)
MCV: 101.2 fL — ABNORMAL HIGH (ref 80.0–100.0)
Monocytes Absolute: 2.4 10*3/uL — ABNORMAL HIGH (ref 0.1–1.0)
Monocytes Absolute: 1.1 10*3/uL — ABNORMAL HIGH (ref 0.1–1.0)
Monocytes Relative: 9 %
Monocytes Relative: 4 %
Neutro Abs: 19.5 10*3/uL — ABNORMAL HIGH (ref 1.7–7.7)
Neutro Abs: 22.5 10*3/uL — ABNORMAL HIGH (ref 1.7–7.7)
Neutrophils Relative %: 71 %
Neutrophils Relative %: 82 %
Platelets: 280 10*3/uL (ref 150–400)
Platelets: 252 10*3/uL (ref 150–400)
RBC: 4.93 MIL/uL (ref 3.87–5.11)
RBC: 4.23 MIL/uL (ref 3.87–5.11)
RDW: 12.6 % (ref 11.5–15.5)
RDW: 12.7 % (ref 11.5–15.5)
WBC: 27.3 10*3/uL — ABNORMAL HIGH (ref 4.0–10.5)
WBC: 27.8 10*3/uL — ABNORMAL HIGH (ref 4.0–10.5)
nRBC: 0 % (ref 0.0–0.2)
nRBC: 0 % (ref 0.0–0.2)

## 2021-05-22 LAB — I-STAT CHEM 8, ED
BUN: 24 mg/dL — ABNORMAL HIGH (ref 6–20)
Calcium, Ion: 1.14 mmol/L — ABNORMAL LOW (ref 1.15–1.40)
Chloride: 108 mmol/L (ref 98–111)
Creatinine, Ser: 1.7 mg/dL — ABNORMAL HIGH (ref 0.44–1.00)
Glucose, Bld: 434 mg/dL — ABNORMAL HIGH (ref 70–99)
HCT: 51 % — ABNORMAL HIGH (ref 36.0–46.0)
Hemoglobin: 17.3 g/dL — ABNORMAL HIGH (ref 12.0–15.0)
Potassium: 5.1 mmol/L (ref 3.5–5.1)
Sodium: 134 mmol/L — ABNORMAL LOW (ref 135–145)
TCO2: 6 mmol/L — ABNORMAL LOW (ref 22–32)

## 2021-05-22 LAB — RAPID URINE DRUG SCREEN, HOSP PERFORMED
Amphetamines: NOT DETECTED
Barbiturates: NOT DETECTED
Benzodiazepines: NOT DETECTED
Cocaine: NOT DETECTED
Opiates: NOT DETECTED
Tetrahydrocannabinol: NOT DETECTED

## 2021-05-22 LAB — APTT: aPTT: 35 seconds (ref 24–36)

## 2021-05-22 LAB — COMPREHENSIVE METABOLIC PANEL
ALT: 34 U/L (ref 0–44)
AST: 51 U/L — ABNORMAL HIGH (ref 15–41)
Albumin: 4.2 g/dL (ref 3.5–5.0)
Alkaline Phosphatase: 75 U/L (ref 38–126)
BUN: 23 mg/dL — ABNORMAL HIGH (ref 6–20)
CO2: <7 mmol/L — ABNORMAL LOW (ref 22–32)
Calcium: 9 mg/dL (ref 8.9–10.3)
Chloride: 99 mmol/L (ref 98–111)
Creatinine, Ser: 2.15 mg/dL — ABNORMAL HIGH (ref 0.44–1.00)
GFR, Estimated: 27 mL/min — ABNORMAL LOW (ref >60)
Glucose, Bld: 451 mg/dL — ABNORMAL HIGH (ref 70–99)
Potassium: 5.1 mmol/L (ref 3.5–5.1)
Sodium: 135 mmol/L (ref 135–145)
Total Bilirubin: 1 mg/dL (ref 0.3–1.2)
Total Protein: 7.4 g/dL (ref 6.5–8.1)

## 2021-05-22 LAB — TROPONIN I (HIGH SENSITIVITY)
Troponin I (High Sensitivity): 42 ng/L — ABNORMAL HIGH (ref <18)
Troponin I (High Sensitivity): 124 ng/L (ref <18)

## 2021-05-22 LAB — LIPASE, BLOOD: Lipase: 29 U/L (ref 11–51)

## 2021-05-22 LAB — PROTIME-INR
INR: 1.2 (ref 0.8–1.2)
Prothrombin Time: 15.5 seconds — ABNORMAL HIGH (ref 11.4–15.2)

## 2021-05-22 LAB — I-STAT BETA HCG BLOOD, ED (MC, WL, AP ONLY): I-stat hCG, quantitative: <5 m[IU]/mL (ref <5)

## 2021-05-22 LAB — CBG MONITORING, ED
Glucose-Capillary: 382 mg/dL — ABNORMAL HIGH (ref 70–99)
Glucose-Capillary: 402 mg/dL — ABNORMAL HIGH (ref 70–99)
Glucose-Capillary: 352 mg/dL — ABNORMAL HIGH (ref 70–99)
Glucose-Capillary: 340 mg/dL — ABNORMAL HIGH (ref 70–99)

## 2021-05-22 LAB — RESP PANEL BY RT-PCR (FLU A&B, COVID) ARPGX2
Influenza A by PCR: NEGATIVE
Influenza B by PCR: NEGATIVE
SARS Coronavirus 2 by RT PCR: NEGATIVE

## 2021-05-22 LAB — BETA-HYDROXYBUTYRIC ACID: Beta-Hydroxybutyric Acid: >8 mmol/L — ABNORMAL HIGH (ref 0.05–0.27)

## 2021-05-22 LAB — SALICYLATE LEVEL: Salicylate Lvl: <7 mg/dL — ABNORMAL LOW (ref 7.0–30.0)

## 2021-05-22 LAB — PHOSPHORUS: Phosphorus: 7.5 mg/dL — ABNORMAL HIGH (ref 2.5–4.6)

## 2021-05-22 LAB — AMMONIA: Ammonia: 126 umol/L — ABNORMAL HIGH (ref 9–35)

## 2021-05-22 LAB — MAGNESIUM: Magnesium: 2.4 mg/dL (ref 1.7–2.4)

## 2021-05-22 LAB — LACTIC ACID, PLASMA: Lactic Acid, Venous: 8 mmol/L (ref 0.5–1.9)

## 2021-05-22 LAB — ETHANOL: Alcohol, Ethyl (B): <10 mg/dL (ref <10)

## 2021-05-22 LAB — ACETAMINOPHEN LEVEL: Acetaminophen (Tylenol), Serum: <10 ug/mL — ABNORMAL LOW (ref 10–30)

## 2021-05-22 MED ORDER — LACTATED RINGERS IV BOLUS
1000.0000 mL | Freq: Once | INTRAVENOUS | Status: AC
  Administered 2021-05-22: 1000 mL via INTRAVENOUS

## 2021-05-22 MED ORDER — LACTATED RINGERS IV SOLN: INTRAVENOUS | Status: DC

## 2021-05-22 MED ORDER — LACTATED RINGERS IV BOLUS
20.0000 mL/kg | Freq: Once | INTRAVENOUS | Status: AC
  Administered 2021-05-22: 1100 mL via INTRAVENOUS

## 2021-05-22 MED ORDER — HEPARIN SODIUM (PORCINE) 5000 UNIT/ML IJ SOLN
5000.0000 [IU] | Freq: Three times a day (TID) | INTRAMUSCULAR | Status: DC
  Administered 2021-05-23 – 2021-05-25 (×8): 5000 [IU] via SUBCUTANEOUS
  Filled 2021-05-22 (×8): qty 1

## 2021-05-22 MED ORDER — NALOXONE HCL 2 MG/2ML IJ SOSY
2.0000 mg | PREFILLED_SYRINGE | Freq: Once | INTRAMUSCULAR | Status: AC
  Administered 2021-05-22: 2 mg via INTRAVENOUS

## 2021-05-22 MED ORDER — POTASSIUM CHLORIDE 10 MEQ/100ML IV SOLN
10.0000 meq | INTRAVENOUS | Status: AC
  Administered 2021-05-22 (×2): 10 meq via INTRAVENOUS
  Filled 2021-05-22 (×2): qty 100

## 2021-05-22 MED ORDER — DEXTROSE IN LACTATED RINGERS 5 % IV SOLN: INTRAVENOUS | Status: DC

## 2021-05-22 MED ORDER — POLYETHYLENE GLYCOL 3350 17 G PO PACK: 17.0000 g | PACK | Freq: Every day | ORAL | Status: DC | PRN

## 2021-05-22 MED ORDER — IOHEXOL 350 MG/ML SOLN
60.0000 mL | Freq: Once | INTRAVENOUS | Status: AC | PRN
  Administered 2021-05-22: 60 mL via INTRAVENOUS

## 2021-05-22 MED ORDER — DEXTROSE 50 % IV SOLN: 0.0000 mL | INTRAVENOUS | Status: DC | PRN

## 2021-05-22 MED ORDER — INSULIN REGULAR(HUMAN) IN NACL 100-0.9 UT/100ML-% IV SOLN
INTRAVENOUS | Status: DC
  Administered 2021-05-22: 7.5 [IU]/h via INTRAVENOUS
  Administered 2021-05-23: 3.2 [IU]/h via INTRAVENOUS
  Filled 2021-05-22 (×2): qty 100

## 2021-05-22 MED ORDER — SODIUM BICARBONATE 8.4 % IV SOLN
50.0000 meq | Freq: Once | INTRAVENOUS | Status: AC
  Administered 2021-05-22: 50 meq via INTRAVENOUS
  Filled 2021-05-22: qty 50

## 2021-05-22 MED ORDER — DOCUSATE SODIUM 100 MG PO CAPS: 100.0000 mg | ORAL_CAPSULE | Freq: Two times a day (BID) | ORAL | Status: DC | PRN

## 2021-05-22 NOTE — H&P (Addendum)
NAME:  Marcia [name], MRN:  [id number], DOB:  1971/08/05, LOS: 0 ADMISSION DATE:  05/22/2021, CONSULTATION DATE:  05/22/21 REFERRING MD:  Maryan Rued, CHIEF COMPLAINT:  AMS   History of Present Illness:  Marcia [name] is a 50 y.o. F with PMH significant for IDDM, Hypothyroidism, Breast Ca (2010), HL, who presented with AMS.  Her husband is at the bedside and states that she has recently been started on Acarbose for her diabetes and felt like she was having side effects.  So has stopped it, 6 days ago had an episode of vomiting and then seemed to improve.  However today, he noted increased fatigue, another episode of vomiting, and then severe altered mental status so brought her into the emergency department.  She also takes [place] for her diabetes and he reports insulin injections as well.  He thinks she has been in the hospital for high blood sugar in the past, but not recently.   Emergency department, patient's labs were significant for glucose in the 400s, beta hydroxybutyrate greater than 8, white blood cell count 20 7K creatinine 2.15, lactic acid 8, pH 6.8.  Head CT and CTA were also completed which did not show any acute findings.  She was started on an insulin drip, given 3 L IV fluid and PCCM consulted for admission given persistent altered mental status.  Pertinent  Medical History  Type 2 diabetes, hyperlipidemia, hypothyroidism, breast cancer   Significant Hospital Events: Including procedures, antibiotic start and stop dates in addition to other pertinent events   10/16 admit to PCCM, started on Endo tool  Interim History / Subjective:  Patient lying in bed, groaning intermittently, not following commands, vital signs are stable protecting her airway  Objective   Blood pressure (!) 100/52, pulse (!) 104, temperature (!) 96.6 F (35.9 C), temperature source Rectal, resp. rate (!) 29, height 5\' 5"  (1.651 m), weight 60 kg, SpO2 99 %.        Intake/Output  Summary (Last 24 hours) at 05/22/2021 2306 Last data filed at 05/22/2021 2237 Gross per 24 hour  Intake 2095.3 ml  Output 250 ml  Net 1845.3 ml   Filed Weights   05/22/21 2109 05/22/21 2136  Weight: 55 kg 60 kg   General: Thin female, chronically ill-appearing resting in bed, no distress HEENT: MM pink/dry Neuro: Lying in bed somnolent, occasionally groaning, sometimes sitting up but not following commands, protecting her airway CV: s1s2 , no m/r/g PULM: Lungs clear bilaterally, no wheezing or rhonchi, no hypoxia or signs of respiratory distress GI: soft, bsx4 active  Extremities: warm/dry,  no edema  Skin: no rashes or lesions    Resolved Hospital Problem list     Assessment & Plan:    Diabetic Ketoacidosis Likely secondary to change in home medications pH 6.8 P: -Has received 3 L IV fluids, still appears volume depleted, will order another liter LR maintenance IV fluids -Insulin gtt. per Endo tool -Every 4 hours BMP -86meq potassium given in ED -monitor electrolytes -repeat VBG -NPO  Acute Metabolic Encephalopathy Secondary to the above, CTH negative P: -monitor closely, avoid intubation if at all possible -suspect will improve with correction of DKA    Leukocytosis WBCs 27k P -Has been does not report any fever at home, suspect this is reactive -Hold antibiotics monitor for signs of infection   Renal insufficiency, likely acute No baseline creatinine in epic, 2.5 on admission Likely secondary to renal volume patient P: -Continue hydration and follow repeat BMP, monitor  urine output and avoid nephrotoxins stable    Hypothyroidism P: -TSH, unable to take p.o.'s tomorrow transition to IV   HL -Hold Statin while n.p.o.  Depression -Hold home Celexa, Paxil, trazodone until mental status improves  Best Practice (right click and "Reselect all SmartList Selections" daily)   Diet/type: NPO DVT prophylaxis: prophylactic heparin  GI prophylaxis:  N/A Lines: N/A Foley:  N/A Code Status:  full code Last date of multidisciplinary goals of care discussion [has been updated at the bedside 10/16]  Labs   CBC: Recent Labs  Lab 05/22/21 2016 05/22/21 2028 05/22/21 2029 05/22/21 2259  WBC 27.3*  --   --   --   NEUTROABS 19.5*  --   --   --   HGB 15.7* 17.3* 16.7* 13.9  HCT 50.8* 51.0* 49.0* 41.0  MCV 103.0*  --   --   --   PLT 280  --   --   --     Basic Metabolic Panel: Recent Labs  Lab 05/22/21 2016 05/22/21 2028 05/22/21 2029 05/22/21 2259  NA 135 134* 133* 135  K 5.1 5.1 5.0 5.2*  CL 99 108  --   --   CO2 <7*  --   --   --   GLUCOSE 451* 434*  --   --   BUN 23* 24*  --   --   CREATININE 2.15* 1.70*  --   --   CALCIUM 9.0  --   --   --    GFR: Estimated Creatinine Clearance: 35.6 mL/min (A) (by C-G formula based on SCr of 1.7 mg/dL (H)). Recent Labs  Lab 05/22/21 2016  WBC 27.3*  LATICACIDVEN 8.0*    Liver Function Tests: Recent Labs  Lab 05/22/21 2016  AST 51*  ALT 34  ALKPHOS 75  BILITOT 1.0  PROT 7.4  ALBUMIN 4.2   Recent Labs  Lab 05/22/21 2016  LIPASE 29   Recent Labs  Lab 05/22/21 2016  AMMONIA 126*    ABG    Component Value Date/Time   PHART 6.810 (LL) 05/22/2021 2050   PCO2ART <20 (LL) 05/22/2021 2050   PO2ART 122 (H) 05/22/2021 2050   HCO3 3.3 (L) 05/22/2021 2259   TCO2 <5 (L) 05/22/2021 2259   ACIDBASEDEF 30.0 (H) 05/22/2021 2259   O2SAT 75.0 05/22/2021 2259     Coagulation Profile: Recent Labs  Lab 05/22/21 2016  INR 1.2    Cardiac Enzymes: No results for input(s): CKTOTAL, CKMB, CKMBINDEX, TROPONINI in the last 168 hours.  HbA1C: No results found for: HGBA1C  CBG: Recent Labs  Lab 05/22/21 2040 05/22/21 2128 05/22/21 2223  GLUCAP 382* 402* 352*    Review of Systems:   Unable to obtain secondary to altered mental status  Past Medical History:  She,  has no past medical history on file.   Surgical History:    Social History:      Family  History:  Her family history is not on file.   Allergies Allergies  Allergen Reactions   Januvia [Sitagliptin] Nausea And Vomiting   Sulfa Antibiotics      Home Medications  Prior to Admission medications   Medication Sig Start Date End Date Taking? Authorizing Provider  acarbose (PRECOSE) 25 MG tablet Take 25 mg by mouth 3 (three) times daily with meals.   Yes [provider]  albuterol (VENTOLIN HFA) 108 (90 Base) MCG/ACT inhaler Inhale 1-2 puffs into the lungs every 6 (six) hours as needed for wheezing or shortness  of breath.   Yes [provider]  citalopram (CELEXA) 20 MG tablet Take 20 mg by mouth at bedtime.   Yes [provider]  dapagliflozin propanediol (FARXIGA) 10 MG TABS tablet Take 10 mg by mouth daily.   Yes [provider]  ibuprofen (ADVIL) 200 MG tablet Take 400 mg by mouth every 6 (six) hours as needed for headache or moderate pain.   Yes [provider]  levothyroxine (SYNTHROID) 50 MCG tablet Take 50 mcg by mouth daily before breakfast.   Yes [provider]  Multiple Vitamins-Calcium (ONE-A-DAY WOMENS FORMULA PO) Take 1 tablet by mouth daily.   Yes [provider]  naproxen sodium (ALEVE) 220 MG tablet Take 220 mg by mouth daily as needed (headache).   Yes [provider]  PARoxetine (PAXIL) 10 MG tablet Take 10 mg by mouth daily.   Yes [provider]  rosuvastatin (CRESTOR) 20 MG tablet Take 20 mg by mouth at bedtime.   Yes [provider]  traZODone (DESYREL) 50 MG tablet Take 50-100 mg by mouth at bedtime as needed for sleep.   Yes [provider]  sitaGLIPtin (JANUVIA) 100 MG tablet Take 100 mg by mouth at bedtime. Patient not taking: Reported on 05/22/2021    [provider]     Critical care time: 50 minutes     CRITICAL CARE Performed by: Otilio Carpen Cree Kunert   Total critical care time: 50 minutes  Critical care time was exclusive of separately  billable procedures and treating other patients.  Critical care was necessary to treat or prevent imminent or life-threatening deterioration.  Critical care was time spent personally by me on the following activities: development of treatment plan with patient and/or surrogate as well as nursing, discussions with consultants, evaluation of patient's response to treatment, examination of patient, obtaining history from patient or surrogate, ordering and performing treatments and interventions, ordering and review of laboratory studies, ordering and review of radiographic studies, pulse oximetry and re-evaluation of patient's condition.  Otilio Carpen Deddrick Saindon, PA-C  Pulmonary & Critical care See Amion for pager If no response to pager , please call [phone] until 7pm After 7:00 pm call Elink  [phone]

## 2021-05-22 NOTE — ED Notes (Signed)
Critical care provider and ed provider at bedside

## 2021-05-22 NOTE — ED Notes (Signed)
Returned from ct 

## 2021-05-22 NOTE — ED Notes (Signed)
Pt in ct scan per ed provider call code stroke at this time secretary made aware

## 2021-05-22 NOTE — ED Notes (Signed)
At ct scan per provider call a code stroke. Secretary called and made aware to call out a code stroke

## 2021-05-22 NOTE — ED Notes (Signed)
Provider at bedside with family, pharmacy tech at bedside

## 2021-05-22 NOTE — ED Notes (Signed)
Decision made to cancel code stroke

## 2021-05-22 NOTE — Consult Note (Addendum)
NEUROLOGY CONSULTATION NOTE   Date of service: May 22, 2021 Patient Name: Marcia [name] MRN:  [id number] DOB:  09/08/1970 Reason for consult: "Stroke code, concern for ICH vs basilar thrombosis" Requesting Provider: Blanchie Dessert, MD _ _ _   _ __   _ __ _ _  __ __   _ __   __ _  History of Present Illness  Marcia [name] is a 50 y.o. female with PMH significant for HLD, hypothytoirism, arthritis, diabetes who was recently started on acarbose a few week ago and felt unwell with vomiting with low back pain. Stopped Acarbose a few days ago and felt better. Over the last few days has been really tired. Today was able to go out with her husband and went to bed in the afternoon around 1400. Still reporting back pain. She was found today essentially unresponsive and not following any commands.  In the ED vitals with significant tachypnea and mild tachycardia but otherwise normal. A stroke code was activated for initial concerns for ICH or basilar thrombosis.   mRS: 0 tNK/thrombectomy: not offered, low suspicion for stroke. NIHSS components Score: Comment  1a Level of Conscious 0[]  1[]  2[]  3[x]      1b LOC Questions 0[]  1[]  2[x]       1c LOC Commands 0[]  1[]  2[x]       2 Best Gaze 0[x]  1[]  2[]       3 Visual 0[x]  1[]  2[]  3[]      4 Facial Palsy 0[x]  1[]  2[]  3[]      5a Motor Arm - left 0[]  1[]  2[]  3[x]  4[]  UN[]    5b Motor Arm - Right 0[]  1[]  2[]  3[x]  4[]  UN[]    6a Motor Leg - Left 0[]  1[]  2[]  3[x]  4[]  UN[]    6b Motor Leg - Right 0[]  1[]  2[]  3[x]  4[]  UN[]    7 Limb Ataxia 0[x]  1[]  2[]  3[]  UN[]     8 Sensory 0[x]  1[]  2[]  UN[]      9 Best Language 0[]  1[]  2[]  3[x]      10 Dysarthria 0[]  1[]  2[x]  UN[]      11 Extinct. and Inattention 0[x]  1[]  2[]       TOTAL: 24      ROS   Unable to obtain 2/2 poor responsiveness.  Past History  No past medical history on file.  No family history on file. Social History   Socioeconomic History  . Marital status: Married    Spouse name: Not on  file  . Number of children: Not on file  . Years of education: Not on file  . Highest education level: Not on file  Occupational History  . Not on file  Tobacco Use  . Smoking status: Not on file  . Smokeless tobacco: Not on file  Substance and Sexual Activity  . Alcohol use: Not on file  . Drug use: Not on file  . Sexual activity: Not on file  Other Topics Concern  . Not on file  Social History Narrative  . Not on file   Social Determinants of Health   Financial Resource Strain: Not on file  Food Insecurity: Not on file  Transportation Needs: Not on file  Physical Activity: Not on file  Stress: Not on file  Social Connections: Not on file   Allergies  Allergen Reactions  . Sulfa Antibiotics     Medications  (Not in a hospital admission)    Vitals   Vitals:   05/22/21 2101 05/22/21 2109 05/22/21 2130 05/22/21 2136  BP: 94/62  Marland Kitchen)  101/56   Pulse: (!) 107  (!) 101   Resp: (!) 28  (!) 32   Temp:      TempSrc:      SpO2: 100%  99%   Weight:  55 kg  60 kg  Height:    5\' 5"  (1.651 m)     Body mass index is 22.01 kg/m.  Physical Exam   General: Laying comfortably in bed; tachypneic  HENT: Normal oropharynx and mucosa. Normal external appearance of ears and nose.  Neck: Supple, no pain or tenderness  CV: No JVD. No peripheral edema.  Pulmonary: Symmetric Chest rise. tachypneic Abdomen: Soft to touch, non-tender.  Ext: No cyanosis, edema, or deformity  Skin: No rash. Normal palpation of skin.   Musculoskeletal: Normal digits and nails by inspection. No clubbing.   Neurologic Examination  Mental status/Cognition: does not open eyes to voice or loud noise. Grimace to noxious stimuli. Speech/language: mute, no speech. Brainstem: Pupils: 39mm BL, equal round but very sluggish response. Cough: intact Gag: unable to elicit given bites her mouth. Corneals: Positive BL  Motor/sensory:  Muscle bulk: normal, tone normal Has spontanoues movement in all  extremities and localizes to pain in all extremities.  Reflexes:  Right Left Comments  Pectoralis      Biceps (C5/6) 1 1   Brachioradialis (C5/6) 1 1    Triceps (C6/7) 1 1    Patellar (L3/4) 1 1    Achilles (S1)      Hoffman      Plantar     Jaw jerk      Coordination/Complex Motor:  Unable to assess but no obvious ataxia.  Labs   CBC:  Recent Labs  Lab 05/22/21 2016 05/22/21 2028 05/22/21 2029  WBC 27.3*  --   --   NEUTROABS 19.5*  --   --   HGB 15.7* 17.3* 16.7*  HCT 50.8* 51.0* 49.0*  MCV 103.0*  --   --   PLT 280  --   --     Basic Metabolic Panel:  Lab Results  Component Value Date   NA 133 (L) 05/22/2021   K 5.0 05/22/2021   CO2 <7 (L) 05/22/2021   GLUCOSE 434 (H) 05/22/2021   BUN 24 (H) 05/22/2021   CREATININE 1.70 (H) 05/22/2021   CALCIUM 9.0 05/22/2021   GFRNONAA 27 (L) 05/22/2021   Lipid Panel: No results found for: LDLCALC HgbA1c: No results found for: HGBA1C Urine Drug Screen:     Component Value Date/Time   LABOPIA NONE DETECTED 05/22/2021 2058   [place] DETECTED 05/22/2021 2058   [place] 05/22/2021 2058   [place] 05/22/2021 2058   [place] DETECTED 05/22/2021 2058   [place] DETECTED 05/22/2021 2058    Alcohol Level     Component Value Date/Time   ETH <10 05/22/2021 2016    CT Head without contrast: Personally reviewed and CTH was negative for a large hypodensity concerning for a large territory infarct or hyperdensity concerning for an [place]   CT angio Head and Neck with contrast: Personally reviewed and no obvious LVO.  Labs with pH of 6.75 on VBG with bicarb of 3.1, lactate of 8, Ammonia of 126.  Impression   Marcia [name] is a 50 y.o. female with PMH significant for HLD, hypothytoirism, arthritis, diabetes who presents with feeling unwell, low back pain with vomiting and then went to bed in the afteroon and was unresponsive and confused and tachypneic. Labs are most concerning  for metabolic acidosis, no basilar thrombosis, no ICH on imaging. Unlikely for this to be a stroke. I suspect that the confusion is secondary to toxic metabolic etiologies. No improvement with Narcan. Glucose is normal.  Impression: - Metabolic acidosis - encephalopathy seondary to toxic metabolic etiology.  Recommendations  - Unlikely stroke or seizures, suspect that encephalopathy is secondary to toxic metabolic etiologies. - Not much for [place] to add at this time and we will signoff. When patient has been adequately treated for the acidosis this and if she still has persistent neuro deficit, please reach out to [place] and will be happy to help. ______________________________________________________________________   This patient is critically ill and at significant risk of neurological worsening, death and care requires constant monitoring of vital signs, hemodynamics,respiratory and cardiac monitoring, neurological assessment, discussion with family, other specialists and medical decision making of high complexity. I spent 35 minutes of neurocritical care time  in the care of  this patient. This was time spent independent of any time provided by nurse practitioner or PA.  Donnetta Simpers [facility] Pager Number [phone] 05/22/2021  10:18 PM   Thank you for the opportunity to take part in the care of this patient. If you have any further questions, please contact the neurology consultation attending.  Signed,  [place] Pager Number [phone] _ _ _   _ __   _ __ _ _  __ __   _ __   __ _

## 2021-05-22 NOTE — Code Documentation (Addendum)
Responded to Code Stroke called on pt at that had just arrived to ED. Pt was brought to ED d/t unresponsiveness. Code Stroke was initiated at 2009, LSN-1200. CBG-451, NIH-24, CTH-negative for acute changes. CTA/CTP-no LVO. Code Stroke cancelled. Plan for metabolic workup.

## 2021-05-22 NOTE — ED Provider Notes (Signed)
[facility] EMERGENCY DEPARTMENT Provider Note   CSN: [id number] Arrival date & time: 05/22/21  1959     History Chief Complaint  Patient presents with   unresponsive    Japneet Staggs is a 50 y.o. female.  Pt is a 50y/o female with hx of DM who is presenting today with ams.  EMS reported that husband stated pt was last seen normal 1 hour prior to call and c/o of AMS.  EMS reports blood sugar of >300 and non-responsive except for rare occurrence of moaning and the goes back to being unresponsive.  Also had some posturing in route.  VS with tachycardia but normal O2 and received 157mL of fluid.  Pt unable to give any hx.  8:18 PM Spoke with husband Raymon who reports that for the last few weeks she has been having issues with a new diabetes medication acarbose and had been having lower back pain and intermittent vomiting but she stopped the medication several days ago and was doing better until today.  She was her normal self this morning but was c/o of lower back pain and headache.  Around noon they got back home and she went to lay down due to her back "killing her".  Husband helped her to the bathroom around 2-3 and said he could still understand what she said but she did not seem normal and then when he checked on her around 7 she was unresponsive.  No recent vomiting or cough.  No complaints that he is aware of dysuria.  The history is provided by the EMS personnel and the spouse. The history is limited by the condition of the patient.      No past medical history on file.  There are no problems to display for this patient.      OB History   No obstetric history on file.     No family history on file.     Home Medications Prior to Admission medications   Not on File    Allergies    Patient has no allergy information on record.  Review of Systems   Review of Systems  Unable to perform ROS: Acuity of condition   Physical Exam Updated  Vital Signs BP 113/76 (BP Location: Left Arm)   Pulse (!) 110   Temp (!) 96.6 F (35.9 C) (Rectal)   Resp (!) 35   SpO2 100%   Physical Exam Vitals and nursing note reviewed.  Constitutional:      General: She is in acute distress.     Appearance: She is well-developed. She is toxic-appearing.     Comments: obtunded  HENT:     Head: Normocephalic and atraumatic.     Mouth/Throat:     Mouth: Mucous membranes are dry.  Eyes:     Conjunctiva/sclera: Conjunctivae normal.     Comments: Pupils 19mm and sluggishly reactive.  At times pt gazing to the left but eyes not fixed  Cardiovascular:     Rate and Rhythm: Regular rhythm. Tachycardia present.     Pulses: Normal pulses.     Heart sounds: No murmur heard. Pulmonary:     Effort: Pulmonary effort is normal. Tachypnea present. No respiratory distress.     Breath sounds: Normal breath sounds. No wheezing or rales.  Abdominal:     General: There is no distension.     Palpations: Abdomen is soft.     Tenderness: There is no abdominal tenderness. There is no guarding or rebound.  Musculoskeletal:        General: No tenderness. Normal range of motion.     Cervical back: Normal range of motion and neck supple.  Skin:    General: Skin is warm and dry.     Findings: No erythema or rash.  Neurological:     Comments: Some posturing with the upper ext.  Moving bilateral lower ext.  No following commands but responsive with change in body position but no meaningful speech  Psychiatric:     Comments: unresponsive    ED Results / Procedures / Treatments   Labs (all labs ordered are listed, but only abnormal results are displayed) Labs Reviewed  CBC WITH DIFFERENTIAL/PLATELET - Abnormal; Notable for the following components:      Result Value   WBC 27.3 (*)    Hemoglobin 15.7 (*)    HCT 50.8 (*)    MCV 103.0 (*)    Neutro Abs 19.5 (*)    Lymphs Abs 5.0 (*)    Monocytes Absolute 2.4 (*)    Basophils Absolute 0.2 (*)    Abs Immature  Granulocytes 0.29 (*)    All other components within normal limits  PROTIME-INR - Abnormal; Notable for the following components:   Prothrombin Time 15.5 (*)    All other components within normal limits  I-STAT VENOUS BLOOD GAS, ED - Abnormal; Notable for the following components:   pH, Ven 6.750 (*)    pCO2, Ven 22.6 (*)    pO2, Ven 53.0 (*)    Bicarbonate 3.1 (*)    TCO2 <5 (*)    Acid-base deficit 30.0 (*)    Sodium 133 (*)    Calcium, Ion 1.11 (*)    HCT 49.0 (*)    Hemoglobin 16.7 (*)    All other components within normal limits  I-STAT CHEM 8, ED - Abnormal; Notable for the following components:   Sodium 134 (*)    BUN 24 (*)    Creatinine, Ser 1.70 (*)    Glucose, Bld 434 (*)    Calcium, Ion 1.14 (*)    TCO2 6 (*)    Hemoglobin 17.3 (*)    HCT 51.0 (*)    All other components within normal limits  CBG MONITORING, ED - Abnormal; Notable for the following components:   Glucose-Capillary 382 (*)    All other components within normal limits  RESP PANEL BY RT-PCR (FLU A&B, COVID) ARPGX2  URINE CULTURE  APTT  URINALYSIS, ROUTINE W REFLEX MICROSCOPIC  COMPREHENSIVE METABOLIC PANEL  LIPASE, BLOOD  LACTIC ACID, PLASMA  AMMONIA  ETHANOL  SALICYLATE LEVEL  ACETAMINOPHEN LEVEL  RAPID URINE DRUG SCREEN, HOSP PERFORMED  BETA-HYDROXYBUTYRIC ACID  BLOOD GAS, ARTERIAL  I-STAT BETA HCG BLOOD, ED (MC, WL, AP ONLY)  I-STAT ARTERIAL BLOOD GAS, ED  TROPONIN I (HIGH SENSITIVITY)    EKG None  Radiology CT Head Wo Contrast  Result Date: 05/22/2021 CLINICAL DATA:  Altered mental status, found unresponsive EXAM: CT HEAD WITHOUT CONTRAST TECHNIQUE: Contiguous axial images were obtained from the base of the skull through the vertex without intravenous contrast. COMPARISON:  None. FINDINGS: Brain: No evidence of acute infarction, hemorrhage, hydrocephalus, extra-axial collection or mass lesion/mass effect. Vascular: No hyperdense vessel or unexpected calcification. Skull: Normal.  Negative for fracture or focal lesion. Sinuses/Orbits: No acute finding. Other: None. IMPRESSION: No acute intracranial abnormality noted. Electronically Signed   By: Inez Catalina M.D.   On: 05/22/2021 20:16   CT CEREBRAL PERFUSION W CONTRAST  Result Date: 05/22/2021  CLINICAL DATA:  Neuro deficit, acute, stroke suspected; Stroke/TIA, assess intracranial arteries; Stroke/TIA, assess extracranial arteries EXAM: CT ANGIOGRAPHY HEAD AND NECK TECHNIQUE: Multidetector CT imaging of the head and neck was performed using the standard protocol during bolus administration of intravenous contrast. Multiplanar CT image reconstructions and MIPs were obtained to evaluate the vascular anatomy. Carotid stenosis measurements (when applicable) are obtained utilizing NASCET criteria, using the distal internal carotid diameter as the denominator. CONTRAST:  60cc omni 350 COMPARISON:  Same day CT head. FINDINGS: CTA NECK FINDINGS Aortic arch: Great vessel origins are patent. Right carotid system: Mild atherosclerosis at the carotid bifurcation without greater than 50% stenosis. Left carotid system: Atherosclerosis of the carotid bifurcation with approximately 30-35% stenosis. Vertebral arteries: Right dominant. No evidence of significant (greater than 50%) stenosis. Skeleton: C4-C7 ACDF. Straightening of the normal cervical lordosis. Rotation of C1 on C2. Other neck: No acute findings.  Small thyroid.  Six Upper chest: Approximately 1 cm rounded ground-glass opacity within the visualized left upper lobe. Review of the MIP images confirms the above findings CTA HEAD FINDINGS Anterior circulation: Bilateral intracranial ICAs, MCAs, and ACAs are patent without proximal hemodynamically significant stenosis. Mild bilateral ICA stenosis due to calcific atherosclerosis. Limited evaluation of the distal MCAs bilaterally due to venous contamination. Posterior circulation: Right dominant vertebral artery. Bilateral intradural vertebral  arteries, basilar artery, and posterior cerebral arteries are patent without proximal hemodynamically significant stenosis. Limited distal PCA evaluation due to venous contamination. No aneurysm identified. Venous sinuses: As permitted by contrast timing, patent. Review of the MIP images confirms the above findings IMPRESSION: CTA head: 1. No large vessel occlusion or proximal hemodynamically significant stenosis. Limited distal evaluation due to venous timing. 2. Mild bilateral intracranial ICA stenosis due to calcific atherosclerosis. CTA neck: 1. Bilateral carotid bifurcation atherosclerosis with approximately 30-35% stenosis of the proximal left ICA. 2. Rotation of C1 on C2, most likely positional in the absence of a fixed torticollis. 3. Approximately 1 cm rounded ground-glass opacity within the visualized left upper lobe. This most likely represents pneumonia or aspiration; however, recommend follow-up chest CT to ensure resolution and exclude malignancy. CTA head impression #1 discussed with Dr. Lorrin Goodell via telephone at 8:40 p.m. Electronically Signed   By: Margaretha Sheffield M.D.   On: 05/22/2021 20:52   CT ANGIO HEAD CODE STROKE  Result Date: 05/22/2021 CLINICAL DATA:  Neuro deficit, acute, stroke suspected; Stroke/TIA, assess intracranial arteries; Stroke/TIA, assess extracranial arteries EXAM: CT ANGIOGRAPHY HEAD AND NECK TECHNIQUE: Multidetector CT imaging of the head and neck was performed using the standard protocol during bolus administration of intravenous contrast. Multiplanar CT image reconstructions and MIPs were obtained to evaluate the vascular anatomy. Carotid stenosis measurements (when applicable) are obtained utilizing NASCET criteria, using the distal internal carotid diameter as the denominator. CONTRAST:  60cc omni 350 COMPARISON:  Same day CT head. FINDINGS: CTA NECK FINDINGS Aortic arch: Great vessel origins are patent. Right carotid system: Mild atherosclerosis at the carotid  bifurcation without greater than 50% stenosis. Left carotid system: Atherosclerosis of the carotid bifurcation with approximately 30-35% stenosis. Vertebral arteries: Right dominant. No evidence of significant (greater than 50%) stenosis. Skeleton: C4-C7 ACDF. Straightening of the normal cervical lordosis. Rotation of C1 on C2. Other neck: No acute findings.  Small thyroid.  Six Upper chest: Approximately 1 cm rounded ground-glass opacity within the visualized left upper lobe. Review of the MIP images confirms the above findings CTA HEAD FINDINGS Anterior circulation: Bilateral intracranial ICAs, MCAs, and ACAs are patent without proximal  hemodynamically significant stenosis. Mild bilateral ICA stenosis due to calcific atherosclerosis. Limited evaluation of the distal MCAs bilaterally due to venous contamination. Posterior circulation: Right dominant vertebral artery. Bilateral intradural vertebral arteries, basilar artery, and posterior cerebral arteries are patent without proximal hemodynamically significant stenosis. Limited distal PCA evaluation due to venous contamination. No aneurysm identified. Venous sinuses: As permitted by contrast timing, patent. Review of the MIP images confirms the above findings IMPRESSION: CTA head: 1. No large vessel occlusion or proximal hemodynamically significant stenosis. Limited distal evaluation due to venous timing. 2. Mild bilateral intracranial ICA stenosis due to calcific atherosclerosis. CTA neck: 1. Bilateral carotid bifurcation atherosclerosis with approximately 30-35% stenosis of the proximal left ICA. 2. Rotation of C1 on C2, most likely positional in the absence of a fixed torticollis. 3. Approximately 1 cm rounded ground-glass opacity within the visualized left upper lobe. This most likely represents pneumonia or aspiration; however, recommend follow-up chest CT to ensure resolution and exclude malignancy. CTA head impression #1 discussed with Dr. Lorrin Goodell via  telephone at 8:40 p.m. Electronically Signed   By: Margaretha Sheffield M.D.   On: 05/22/2021 20:52   CT ANGIO NECK CODE STROKE  Result Date: 05/22/2021 CLINICAL DATA:  Neuro deficit, acute, stroke suspected; Stroke/TIA, assess intracranial arteries; Stroke/TIA, assess extracranial arteries EXAM: CT ANGIOGRAPHY HEAD AND NECK TECHNIQUE: Multidetector CT imaging of the head and neck was performed using the standard protocol during bolus administration of intravenous contrast. Multiplanar CT image reconstructions and MIPs were obtained to evaluate the vascular anatomy. Carotid stenosis measurements (when applicable) are obtained utilizing NASCET criteria, using the distal internal carotid diameter as the denominator. CONTRAST:  60cc omni 350 COMPARISON:  Same day CT head. FINDINGS: CTA NECK FINDINGS Aortic arch: Great vessel origins are patent. Right carotid system: Mild atherosclerosis at the carotid bifurcation without greater than 50% stenosis. Left carotid system: Atherosclerosis of the carotid bifurcation with approximately 30-35% stenosis. Vertebral arteries: Right dominant. No evidence of significant (greater than 50%) stenosis. Skeleton: C4-C7 ACDF. Straightening of the normal cervical lordosis. Rotation of C1 on C2. Other neck: No acute findings.  Small thyroid.  Six Upper chest: Approximately 1 cm rounded ground-glass opacity within the visualized left upper lobe. Review of the MIP images confirms the above findings CTA HEAD FINDINGS Anterior circulation: Bilateral intracranial ICAs, MCAs, and ACAs are patent without proximal hemodynamically significant stenosis. Mild bilateral ICA stenosis due to calcific atherosclerosis. Limited evaluation of the distal MCAs bilaterally due to venous contamination. Posterior circulation: Right dominant vertebral artery. Bilateral intradural vertebral arteries, basilar artery, and posterior cerebral arteries are patent without proximal hemodynamically significant stenosis.  Limited distal PCA evaluation due to venous contamination. No aneurysm identified. Venous sinuses: As permitted by contrast timing, patent. Review of the MIP images confirms the above findings IMPRESSION: CTA head: 1. No large vessel occlusion or proximal hemodynamically significant stenosis. Limited distal evaluation due to venous timing. 2. Mild bilateral intracranial ICA stenosis due to calcific atherosclerosis. CTA neck: 1. Bilateral carotid bifurcation atherosclerosis with approximately 30-35% stenosis of the proximal left ICA. 2. Rotation of C1 on C2, most likely positional in the absence of a fixed torticollis. 3. Approximately 1 cm rounded ground-glass opacity within the visualized left upper lobe. This most likely represents pneumonia or aspiration; however, recommend follow-up chest CT to ensure resolution and exclude malignancy. CTA head impression #1 discussed with Dr. Lorrin Goodell via telephone at 8:40 p.m. Electronically Signed   By: Margaretha Sheffield M.D.   On: 05/22/2021 20:52    Procedures  Procedures   Medications Ordered in ED Medications  lactated ringers bolus 1,000 mL (1,000 mLs Intravenous New Bag/Given 05/22/21 2051)  naloxone [facility]) injection 2 mg (2 mg Intravenous Given 05/22/21 2028)  lactated ringers bolus 1,000 mL (1,000 mLs Intravenous New Bag/Given 05/22/21 2041)  iohexol (OMNIPAQUE) 350 MG/ML injection 60 mL (60 mLs Intravenous Contrast Given 05/22/21 2044)    ED Course  I have reviewed the triage vital signs and the nursing notes.  Pertinent labs & imaging results that were available during my care of the patient were reviewed by me and considered in my medical decision making (see chart for details).    MDM Rules/Calculators/A&P                           50y/o female presenting today with AMS.  Pt initialy was told by EMS was LSN 1 hour prior to call and due to unresponsiveness and some concerning neuro sx such as posturing (?) and intermittent eye gaze code  stroke called.  Then was able to get ahold of her husband who then reports she was last completely normal at 6.  She had been having back pain and concern for sepsis, DKA, acute neuro abnormality.  No hx of drug, opiate or etoh use.  Afebrile here and currently protecting airway and blood sugar in the \\300 's.  Pt given IVF and labs pending.  Head CT and neuro to eval.  No obvious signs of siezure at this time.  8:56 PM Patient CT of her head is negative for acute bleed and CTA without significant stenosis.  Dr. Tilman Neat had evaluated the patient but with more history patient's symptoms started at least 5 to 6 hours ago.  She is not a code stroke.  With further history from her husband concern now for metabolic causes.  Patient's labs started coming back and VBG with pH of 6.75 and blood sugar in the 400s.  Hemoglobin of 16 and white count of 27,000.  Patient is intermittently moaning and is altered however with her tachypnea, acidosis and suspected DKA feel that intubation could worsen her acidosis.  We will start IV fluids and Endo tool.  We will continue monitoring change in mental status closely.  Currently oxygen saturation 100%.  Will discuss with critical care.  9:37 PM Patient remains altered.  Labs returned and pH persistent 6.8 with a bicarb of 3 and a CO2 of less than 20.  Patient given IV fluid bolus of 2 L of LR.  She was then started on Endo tool and given potassium and started on insulin drip.  She was also given bicarb per critical care.  Given severe electrolyte abnormalities and currently patient is satting 100% and tachypneic we will hold on intubation so as to not worsen her acidosis.  UA with ketones but no evidence of infection.  Chest x-ray within normal limits.   MDM   Amount and/or Complexity of Data Reviewed Clinical lab tests: ordered and reviewed Tests in the radiology section of CPT: ordered and reviewed Tests in the medicine section of CPT: ordered and  reviewed Decide to obtain previous medical records or to obtain history from someone other than the patient: yes Obtain history from someone other than the patient: yes Review and summarize past medical records: yes Discuss the patient with other providers: yes Independent visualization of images, tracings, or specimens: yes  Risk of Complications, Morbidity, and/or Mortality Presenting problems: high Diagnostic procedures: high Management options:  high  Patient Progress Patient progress: other (comment) (critical)    CRITICAL CARE Performed by: Onesty Clair Total critical care time: 60 minutes Critical care time was exclusive of separately billable procedures and treating other patients. Critical care was necessary to treat or prevent imminent or life-threatening deterioration. Critical care was time spent personally by me on the following activities: development of treatment plan with patient and/or surrogate as well as nursing, discussions with consultants, evaluation of patient's response to treatment, examination of patient, obtaining history from patient or surrogate, ordering and performing treatments and interventions, ordering and review of laboratory studies, ordering and review of radiographic studies, pulse oximetry and re-evaluation of patient's condition. 60 Final Clinical Impression(s) / ED Diagnoses Final diagnoses:  Diabetic ketoacidosis with coma associated with type 2 diabetes mellitus ([place])  Metabolic acidosis    Rx / DC Orders ED Discharge Orders     None        Blanchie Dessert, MD 05/22/21 2141

## 2021-05-22 NOTE — ED Notes (Signed)
Per endo tool no change in insulin rate

## 2021-05-22 NOTE — ED Notes (Signed)
Brought to ct scan at

## 2021-05-22 NOTE — ED Notes (Signed)
Provider at bedside

## 2021-05-22 NOTE — ED Notes (Signed)
Patient transported to CT 

## 2021-05-22 NOTE — ED Notes (Signed)
Pt is occasionally moaning and groaning, will open her eyes to verbal stimuli but will not follow commands

## 2021-05-22 NOTE — ED Notes (Signed)
Pt is now waking up and moaning and groaning pulling against staff and family members. Unable to get pt to keep lt arm straight for medication to infuse 3rd line started in rt fa at this time for medication to infuse

## 2021-05-22 NOTE — ED Triage Notes (Signed)
BIB [facility]. Found unresponsive by husband around 1800. Last known well time around noon today. Unresponsive to allk stimuli with EMS. CBG 399,357, PIV 20ga LT AC, NS 115ml. C/o lower back pain today

## 2021-05-23 DIAGNOSIS — N179 Acute kidney failure, unspecified: Secondary | ICD-10-CM

## 2021-05-23 DIAGNOSIS — G9341 Metabolic encephalopathy: Secondary | ICD-10-CM

## 2021-05-23 DIAGNOSIS — E872 Acidosis, unspecified: Secondary | ICD-10-CM | POA: Diagnosis not present

## 2021-05-23 DIAGNOSIS — E1111 Type 2 diabetes mellitus with ketoacidosis with coma: Secondary | ICD-10-CM | POA: Diagnosis not present

## 2021-05-23 LAB — BASIC METABOLIC PANEL
Anion gap: 18 — ABNORMAL HIGH (ref 5–15)
Anion gap: 13 (ref 5–15)
BUN: 21 mg/dL — ABNORMAL HIGH (ref 6–20)
BUN: 19 mg/dL (ref 6–20)
BUN: 15 mg/dL (ref 6–20)
BUN: 13 mg/dL (ref 6–20)
CO2: <7 mmol/L — ABNORMAL LOW (ref 22–32)
CO2: <7 mmol/L — ABNORMAL LOW (ref 22–32)
CO2: 8 mmol/L — ABNORMAL LOW (ref 22–32)
CO2: 13 mmol/L — ABNORMAL LOW (ref 22–32)
Calcium: 8 mg/dL — ABNORMAL LOW (ref 8.9–10.3)
Calcium: 8 mg/dL — ABNORMAL LOW (ref 8.9–10.3)
Calcium: 7.9 mg/dL — ABNORMAL LOW (ref 8.9–10.3)
Calcium: 7.7 mg/dL — ABNORMAL LOW (ref 8.9–10.3)
Chloride: 105 mmol/L (ref 98–111)
Chloride: 107 mmol/L (ref 98–111)
Chloride: 111 mmol/L (ref 98–111)
Chloride: 113 mmol/L — ABNORMAL HIGH (ref 98–111)
Creatinine, Ser: 1.95 mg/dL — ABNORMAL HIGH (ref 0.44–1.00)
Creatinine, Ser: 1.8 mg/dL — ABNORMAL HIGH (ref 0.44–1.00)
Creatinine, Ser: 1.59 mg/dL — ABNORMAL HIGH (ref 0.44–1.00)
Creatinine, Ser: 1.48 mg/dL — ABNORMAL HIGH (ref 0.44–1.00)
GFR, Estimated: 31 mL/min — ABNORMAL LOW (ref >60)
GFR, Estimated: 34 mL/min — ABNORMAL LOW (ref >60)
GFR, Estimated: 39 mL/min — ABNORMAL LOW (ref >60)
GFR, Estimated: 43 mL/min — ABNORMAL LOW (ref >60)
Glucose, Bld: 326 mg/dL — ABNORMAL HIGH (ref 70–99)
Glucose, Bld: 197 mg/dL — ABNORMAL HIGH (ref 70–99)
Glucose, Bld: 202 mg/dL — ABNORMAL HIGH (ref 70–99)
Glucose, Bld: 147 mg/dL — ABNORMAL HIGH (ref 70–99)
Potassium: 5.2 mmol/L — ABNORMAL HIGH (ref 3.5–5.1)
Potassium: 4.6 mmol/L (ref 3.5–5.1)
Potassium: 4.3 mmol/L (ref 3.5–5.1)
Potassium: 3.9 mmol/L (ref 3.5–5.1)
Sodium: 137 mmol/L (ref 135–145)
Sodium: 136 mmol/L (ref 135–145)
Sodium: 137 mmol/L (ref 135–145)
Sodium: 139 mmol/L (ref 135–145)

## 2021-05-23 LAB — BLOOD GAS, ARTERIAL
Acid-base deficit: 29.8 mmol/L — ABNORMAL HIGH (ref 0.0–2.0)
Bicarbonate: 1.4 mmol/L — ABNORMAL LOW (ref 20.0–28.0)
Drawn by: 36277
FIO2: 21
O2 Saturation: 94.6 %
Patient temperature: 37
pCO2 arterial: <19 mmHg — CL (ref 32.0–48.0)
pH, Arterial: 6.81 — CL (ref 7.350–7.450)
pO2, Arterial: 122 mmHg — ABNORMAL HIGH (ref 83.0–108.0)

## 2021-05-23 LAB — GLUCOSE, CAPILLARY
Glucose-Capillary: 134 mg/dL — ABNORMAL HIGH (ref 70–99)
Glucose-Capillary: 142 mg/dL — ABNORMAL HIGH (ref 70–99)
Glucose-Capillary: 143 mg/dL — ABNORMAL HIGH (ref 70–99)
Glucose-Capillary: 148 mg/dL — ABNORMAL HIGH (ref 70–99)
Glucose-Capillary: 155 mg/dL — ABNORMAL HIGH (ref 70–99)
Glucose-Capillary: 156 mg/dL — ABNORMAL HIGH (ref 70–99)
Glucose-Capillary: 157 mg/dL — ABNORMAL HIGH (ref 70–99)
Glucose-Capillary: 160 mg/dL — ABNORMAL HIGH (ref 70–99)
Glucose-Capillary: 162 mg/dL — ABNORMAL HIGH (ref 70–99)
Glucose-Capillary: 165 mg/dL — ABNORMAL HIGH (ref 70–99)
Glucose-Capillary: 167 mg/dL — ABNORMAL HIGH (ref 70–99)
Glucose-Capillary: 168 mg/dL — ABNORMAL HIGH (ref 70–99)
Glucose-Capillary: 174 mg/dL — ABNORMAL HIGH (ref 70–99)
Glucose-Capillary: 180 mg/dL — ABNORMAL HIGH (ref 70–99)
Glucose-Capillary: 180 mg/dL — ABNORMAL HIGH (ref 70–99)
Glucose-Capillary: 185 mg/dL — ABNORMAL HIGH (ref 70–99)
Glucose-Capillary: 189 mg/dL — ABNORMAL HIGH (ref 70–99)
Glucose-Capillary: 191 mg/dL — ABNORMAL HIGH (ref 70–99)
Glucose-Capillary: 195 mg/dL — ABNORMAL HIGH (ref 70–99)
Glucose-Capillary: 198 mg/dL — ABNORMAL HIGH (ref 70–99)
Glucose-Capillary: 216 mg/dL — ABNORMAL HIGH (ref 70–99)
Glucose-Capillary: 280 mg/dL — ABNORMAL HIGH (ref 70–99)

## 2021-05-23 LAB — POCT I-STAT 7, (LYTES, BLD GAS, ICA,H+H)
Acid-base deficit: 14 mmol/L — ABNORMAL HIGH (ref 0.0–2.0)
Acid-base deficit: 10 mmol/L — ABNORMAL HIGH (ref 0.0–2.0)
Bicarbonate: 9.7 mmol/L — ABNORMAL LOW (ref 20.0–28.0)
Bicarbonate: 13.2 mmol/L — ABNORMAL LOW (ref 20.0–28.0)
Calcium, Ion: 1.18 mmol/L (ref 1.15–1.40)
Calcium, Ion: 1.15 mmol/L (ref 1.15–1.40)
HCT: 34 % — ABNORMAL LOW (ref 36.0–46.0)
HCT: 36 % (ref 36.0–46.0)
Hemoglobin: 11.6 g/dL — ABNORMAL LOW (ref 12.0–15.0)
Hemoglobin: 12.2 g/dL (ref 12.0–15.0)
O2 Saturation: 85 %
O2 Saturation: 77 %
Patient temperature: 98.7
Potassium: 3.4 mmol/L — ABNORMAL LOW (ref 3.5–5.1)
Potassium: 2.9 mmol/L — ABNORMAL LOW (ref 3.5–5.1)
Sodium: 139 mmol/L (ref 135–145)
Sodium: 139 mmol/L (ref 135–145)
TCO2: 10 mmol/L — ABNORMAL LOW (ref 22–32)
TCO2: 14 mmol/L — ABNORMAL LOW (ref 22–32)
pCO2 arterial: 18.4 mmHg — CL (ref 32.0–48.0)
pCO2 arterial: 21.3 mmHg — ABNORMAL LOW (ref 32.0–48.0)
pH, Arterial: 7.331 — ABNORMAL LOW (ref 7.350–7.450)
pH, Arterial: 7.402 (ref 7.350–7.450)
pO2, Arterial: 52 mmHg — ABNORMAL LOW (ref 83.0–108.0)
pO2, Arterial: 40 mmHg — CL (ref 83.0–108.0)

## 2021-05-23 LAB — PHOSPHORUS: Phosphorus: 4.7 mg/dL — ABNORMAL HIGH (ref 2.5–4.6)

## 2021-05-23 LAB — BLOOD GAS, VENOUS
Acid-base deficit: 23.5 mmol/L — ABNORMAL HIGH (ref 0.0–2.0)
Acid-base deficit: 6.3 mmol/L — ABNORMAL HIGH (ref 0.0–2.0)
Bicarbonate: 17.7 mmol/L — ABNORMAL LOW (ref 20.0–28.0)
Bicarbonate: 5.8 mmol/L — ABNORMAL LOW (ref 20.0–28.0)
Drawn by: 6382
Drawn by: 6385
FIO2: 21
O2 Saturation: 60.3 %
O2 Saturation: 71.9 %
Patient temperature: 37
Patient temperature: 37
pCO2, Ven: 26.8 mmHg — ABNORMAL LOW (ref 44.0–60.0)
pCO2, Ven: 29.6 mmHg — ABNORMAL LOW (ref 44.0–60.0)
pH, Ven: 6.967 — CL (ref 7.250–7.430)
pH, Ven: 7.394 (ref 7.250–7.430)
pO2, Ven: 34.3 mmHg (ref 32.0–45.0)
pO2, Ven: 34.7 mmHg (ref 32.0–45.0)

## 2021-05-23 LAB — CBC
HCT: 44.7 % (ref 36.0–46.0)
Hemoglobin: 14.6 g/dL (ref 12.0–15.0)
MCH: 32.2 pg (ref 26.0–34.0)
MCHC: 32.7 g/dL (ref 30.0–36.0)
MCV: 98.5 fL (ref 80.0–100.0)
Platelets: 200 10*3/uL (ref 150–400)
RBC: 4.54 MIL/uL (ref 3.87–5.11)
RDW: 12.6 % (ref 11.5–15.5)
WBC: 25.8 10*3/uL — ABNORMAL HIGH (ref 4.0–10.5)
nRBC: 0 % (ref 0.0–0.2)

## 2021-05-23 LAB — LACTIC ACID, PLASMA
Lactic Acid, Venous: 3.9 mmol/L (ref 0.5–1.9)
Lactic Acid, Venous: 1.7 mmol/L (ref 0.5–1.9)
Lactic Acid, Venous: 1.7 mmol/L (ref 0.5–1.9)

## 2021-05-23 LAB — BETA-HYDROXYBUTYRIC ACID
Beta-Hydroxybutyric Acid: >8 mmol/L — ABNORMAL HIGH (ref 0.05–0.27)
Beta-Hydroxybutyric Acid: >8 mmol/L — ABNORMAL HIGH (ref 0.05–0.27)
Beta-Hydroxybutyric Acid: 5.71 mmol/L — ABNORMAL HIGH (ref 0.05–0.27)

## 2021-05-23 LAB — URINE CULTURE: Culture: NO GROWTH

## 2021-05-23 LAB — HIV ANTIBODY (ROUTINE TESTING W REFLEX): HIV Screen 4th Generation wRfx: NONREACTIVE

## 2021-05-23 LAB — MAGNESIUM: Magnesium: 2.1 mg/dL (ref 1.7–2.4)

## 2021-05-23 LAB — MRSA NEXT GEN BY PCR, NASAL: MRSA by PCR Next Gen: NOT DETECTED

## 2021-05-23 LAB — TSH: TSH: 1.097 u[IU]/mL (ref 0.350–4.500)

## 2021-05-23 MED ORDER — LACTATED RINGERS IV BOLUS
1000.0000 mL | Freq: Once | INTRAVENOUS | Status: AC
  Administered 2021-05-23: 1000 mL via INTRAVENOUS

## 2021-05-23 MED ORDER — STERILE WATER FOR INJECTION IV SOLN
INTRAVENOUS | Status: DC
  Filled 2021-05-23: qty 1000

## 2021-05-23 MED ORDER — ALBUTEROL SULFATE (2.5 MG/3ML) 0.083% IN NEBU
2.5000 mg | INHALATION_SOLUTION | RESPIRATORY_TRACT | Status: DC | PRN
  Administered 2021-05-23: 2.5 mg via RESPIRATORY_TRACT
  Filled 2021-05-23: qty 3

## 2021-05-23 MED ORDER — SODIUM CHLORIDE 0.45 % IV SOLN
INTRAVENOUS | Status: DC
  Filled 2021-05-23: qty 75

## 2021-05-23 MED ORDER — CHLORHEXIDINE GLUCONATE CLOTH 2 % EX PADS
6.0000 | MEDICATED_PAD | Freq: Every day | CUTANEOUS | Status: DC
  Administered 2021-05-23 – 2021-05-24 (×2): 6 via TOPICAL

## 2021-05-23 MED ORDER — CHLORHEXIDINE GLUCONATE 0.12 % MT SOLN
15.0000 mL | Freq: Two times a day (BID) | OROMUCOSAL | Status: DC
  Administered 2021-05-23 (×2): 15 mL via OROMUCOSAL
  Filled 2021-05-23 (×2): qty 15

## 2021-05-23 MED ORDER — ORAL CARE MOUTH RINSE
15.0000 mL | Freq: Two times a day (BID) | OROMUCOSAL | Status: DC
  Administered 2021-05-23 – 2021-05-24 (×3): 15 mL via OROMUCOSAL

## 2021-05-23 NOTE — Progress Notes (Signed)
eLink Physician-Brief Progress Note Patient Name: Marcia [name] DOB: 1971-01-01 MRN: [id number]   Date of Service  05/23/2021  HPI/Events of Note  DKA. Still severely acidotic. UOP and CBG getting better. No diarrhea. Started bicarb gtt at 75 ml/hr Follow BMP.  Camera re eval done also.   eICU Interventions  Discused.         Elmer Sow 05/23/2021, 3:11 AM

## 2021-05-23 NOTE — TOC Initial Note (Signed)
Transition of Care [facility]) - Initial/Assessment Note    Patient Details  Name: Marcia [name] MRN: [id number] Date of Birth: 12-06-1970  Transition of Care [facility]) CM/SW Contact:    Tom-Johnson, Renea Ee, RN Phone Number: 05/23/2021, 4:06 PM  Clinical Narrative:                 CM spoke with patient at bedside about TOC needs for post hospital transition. Has PMH significant for IDDM, Hypothyroidism, Breast Ca (2010), HL. Presented with   Altered mental status after stopping her Acarbose 5 days prior and admitted for acute metabolic encephalopathy 2/2 DKA. Patient states she lives with her husband and 58 yr old son. Has two sisters and mother is alive and supportive with care. Employed by [employer]. Independent with care and able to drive self prior to hospitalization. Does not have any DME's. Has a PCP and Medically Insured by [facility]. Husband will transport at discharge. Medical workup continues.    Patient Goals and CMS Choice Patient states their goals for this hospitalization and ongoing recovery are:: To go home   Choice offered to / list presented to : Patient  Expected Discharge Plan and Services     Discharge Planning Services: CM Consult   Living arrangements for the past 2 months: Single Family Home                                      Prior Living Arrangements/Services Living arrangements for the past 2 months: Single Family Home Lives with:: Spouse, Adult Children Patient language and need for interpreter reviewed:: Yes Do you feel safe going back to the place where you live?: Yes      Need for Family Participation in Patient Care: Yes (Comment)     Criminal Activity/Legal Involvement Pertinent to Current Situation/Hospitalization: No - Comment as needed  Activities of Daily Living      Permission Sought/Granted Permission sought to share information with : Case Manager, Family Supports Permission granted to share information with  : Yes, Verbal Permission Granted              Emotional Assessment Appearance:: Appears stated age Attitude/Demeanor/Rapport: Engaged Affect (typically observed): Accepting, Appropriate, Calm, Hopeful Orientation: : Oriented to Self, Oriented to Place, Oriented to  Time, Oriented to Situation Alcohol / Substance Use: Not Applicable Psych Involvement: No (comment)  Admission diagnosis:  Metabolic acidosis [Q11.94] DKA (diabetic ketoacidosis) ([place]) [E11.10] Diabetic ketoacidosis with coma associated with type 2 diabetes mellitus ([place]) [E11.11] Patient Active Problem List   Diagnosis Date Noted   DKA (diabetic ketoacidosis) ([place]) 17/40/8144   Metabolic acidosis    Renal insufficiency    Encephalopathy    PCP:  Danelle Berry, NP Pharmacy:   [facility] ([facility] [place] [facility] [place])  [id number] Phone: [phone] Fax: [phone]     Social Determinants of Health (SDOH) Interventions    Readmission Risk Interventions No flowsheet data found.

## 2021-05-23 NOTE — Progress Notes (Signed)
Pt BP 85/54 MAP 64. Dr. Tacy Learn made aware. Orders received for LR bolus.

## 2021-05-23 NOTE — Progress Notes (Signed)
eLink Physician-Brief Progress Note Patient Name: Kelcie Currie DOB: 1971-06-26 MRN: [id number]   Date of Service  05/23/2021  HPI/Events of Note  50 y.o. F with PMH significant for IDDM, Hypothyroidism, Breast Ca (2010), HL, who presented with AMS. beta hydroxybutyrate greater than 8, white blood cell count 20 7K creatinine 2.15, lactic acid 8, pH 6.8.  Head CT and CTA were also completed which did not show any acute findings.  Admitted to ICU for DKA/encephalopathy AKI leukocytosis-w/o obvious infection.   Data: Reviewed PH6.8 Last CBG 340, improving K stable at 5.2, Cr 1.95  Camera: Sinus tachy 115, MAP 66. On room air, sats normal. Encephalopathy.  On room air. On Insulin gtt/fluids    eICU Interventions  - follow DKA protocol - aspiration precautions. - got over 3 lit fluids. -VTE: sq heparin. Watch for fever, worsening wbc.  Or hypoxemia.      Intervention Category Major Interventions: Hyperglycemia - active titration of insulin therapy Evaluation Type: New Patient Evaluation  Elmer Sow 05/23/2021, 12:01 AM

## 2021-05-23 NOTE — Progress Notes (Signed)
Inpatient Diabetes Program Recommendations  AACE/ADA: New Consensus Statement on Inpatient Glycemic Control (2015)  Target Ranges:  Prepandial:   less than 140 mg/dL      Peak postprandial:   less than 180 mg/dL (1-2 hours)      Critically ill patients:  140 - 180 mg/dL   Lab Results  Component Value Date   GLUCAP 168 (H) 05/23/2021    Review of Glycemic Control Results for RAFEEF, LAU (MRN [id number]) as of 05/23/2021 11:45  Ref. Range 05/23/2021 08:00 05/23/2021 09:06 05/23/2021 10:15 05/23/2021 11:16  Glucose-Capillary Latest Ref Range: 70 - 99 mg/dL 148 (H) 185 (H) 195 (H) 168 (H)   Diabetes history: Type 2 DM Outpatient Diabetes medications: Farxiga 10 mg QD, Acrobose (self Dcd) Current orders for Inpatient glycemic control: IV insulin  Inpatient Diabetes Program Recommendations:    Continue with IV insulin.  Add A1C? Will plan to see patient when appropriate.   Thanks, Bronson Curb, MSN, RNC-OB Diabetes Coordinator [phone] (8a-5p)

## 2021-05-23 NOTE — Progress Notes (Signed)
NAME:  Marcia [name], MRN:  [id number], DOB:  27-May-1971, LOS: 1 ADMISSION DATE:  05/22/2021, CONSULTATION DATE:  05/22/2021 REFERRING MD:  Maryan Rued, CHIEF COMPLAINT:  AMS   Brief History   Marcia [name] is a 50 y.o. with a pertinent PMH of DM, Hypothyroidism, Breast Ca (2010), who presented with fatigue, emesis and AMS after stopping her Acarbose 5 days prior and admitted for acute metabolic encephalopathy 2/2 DKA.   Past Medical History  DM Hypothyroidism Breast Cancer HLD Depression  Significant Hospital Events   10/16 admitted to PCCM, started on endo tool   Consults:  Neurology   Procedures:  N/A  Significant Diagnostic Tests:  CT Head Wo Contrast Result Date: 05/22/2021 FINDINGS: Brain: No evidence of acute infarction, hemorrhage, hydrocephalus, extra-axial collection or mass lesion/mass effect. Vascular: No hyperdense vessel or unexpected calcification. Skull: Normal. Negative for fracture or focal lesion. Sinuses/Orbits: No acute finding. Other: None. IMPRESSION: No acute intracranial abnormality noted.   CT CEREBRAL PERFUSION W CONTRAST Result Date: 05/22/2021 IMPRESSION: CTA head: 1. No large vessel occlusion or proximal hemodynamically significant stenosis. Limited distal evaluation due to venous timing. 2. Mild bilateral intracranial ICA stenosis due to calcific atherosclerosis. CTA neck: 1. Bilateral carotid bifurcation atherosclerosis with approximately 30-35% stenosis of the proximal left ICA. 2. Rotation of C1 on C2, most likely positional in the absence of a fixed torticollis. 3. Approximately 1 cm rounded ground-glass opacity within the visualized left upper lobe. This most likely represents pneumonia or aspiration; however, recommend follow-up chest CT to ensure resolution and exclude malignancy.  DG Chest Port 1 View Result Date: 05/22/2021 FINDINGS: Heart is normal size. No confluent airspace opacities or effusions. No acute bony abnormality.  IMPRESSION: No active disease.    CT ANGIO NECK CODE STROKE Result Date: 05/22/2021 IMPRESSION: CTA head: 1. No large vessel occlusion or proximal hemodynamically significant stenosis. Limited distal evaluation due to venous timing. 2. Mild bilateral intracranial ICA stenosis due to calcific atherosclerosis. CTA neck: 1. Bilateral carotid bifurcation atherosclerosis with approximately 30-35% stenosis of the proximal left ICA. 2. Rotation of C1 on C2, most likely positional in the absence of a fixed torticollis. 3. Approximately 1 cm rounded ground-glass opacity within the visualized left upper lobe. This most likely represents pneumonia or aspiration; however, recommend follow-up chest CT to ensure resolution and exclude malignancy.  Micro Data:  10/16 COVID negative 10/16 MRSA negative Antimicrobials:  none  Interim History/Subjective:  Overnight: no significant overnight events  Patient resting in bed.  She is acutely ill and states that she feels poorly.  She continues have significant nausea but improved abdominal pain.  He continues to have significant tachypnea but does not appear to be having acute respiratory failure.  While she denies any symptoms leading up to her hospitalization.  She denies chest pain, shortness of breath, systemic signs of infection, trauma.  Objective:  Blood pressure 108/84, pulse (!) 128, temperature (!) 95.6 F (35.3 C), temperature source Axillary, resp. rate (!) 27, height 5\' 5"  (1.651 m), weight 58.2 kg, SpO2 97 %.        Intake/Output Summary (Last 24 hours) at 05/23/2021 0541 Last data filed at 05/23/2021 0400 Gross per 24 hour  Intake 3573.28 ml  Output 500 ml  Net 3073.28 ml   Filed Weights   05/22/21 2136 05/23/21 0001 05/23/21 0429  Weight: 60 kg 58.2 kg 58.2 kg    Examination: General: Alert and oriented, acutely ill HEENT: PERRLA, and EO intact Lungs: Clear bilaterally, but tachypneic  Cardiovascular: Tachycardic but regular rhythm  no obvious murmurs rubs or gallops abdomen:  Extremities: Soft and nontender Neuro: No focal neurologic deficits GU: n/a  Patient Lines/Drains/Airways Status     Active Line/Drains/Airways     Name Placement date Placement time Site Days   Peripheral IV 05/22/21 20 G Left Antecubital 05/22/21  2043  Antecubital  1   Peripheral IV 05/22/21 20 G Right Antecubital 05/22/21  2015  Antecubital  1   Peripheral IV 05/22/21 20 G Posterior;Right Forearm 05/22/21  2223  Forearm  1   External Urinary Catheter 05/22/21  2105  --  1            Resolved Hospital Problem list   none  Assessment & Plan:   Acute Metabolic Encephalopathy 2/2 Diabetic Ketoacidosis: Diabetes Mellitus, Type 2:  AGMA w/ elevated BHB and Lactic acid Patient is on acarbose, Farxiga, Januvia at home.Marland Kitchen Recently self d/c'd acrabose. Presented with AMS. Code stroke called CTA negative for CVA. Had elevated glucose, AGMA w/ elevated BHB and lactic acid. She had a negative pregnancy test. EKG does not show any significant ischemic changes. WBC is persistently elevated at 25.8 with a left shift. UA only showed rare bacteria and CXR was clear. No obvious source of infection. Lactic acidosis could be 2/2 hypotension in the setting of significant naturiesis. Acidosis minimally improved since admission to 6.96 with pCO2 of 26.8 and Bicarb <7. - Continue Fluid resuscitation, consider switching to NS - Continue Insulin gtt - Cont q4 hr BMP - goal K >4 and Mg > 2 - NPO due to mental status - Repeat ABG this morning and hold bicarb infusion -We will hold centrally acting medications   Leukocytosis White blood cell count stable at 25.8 today with a left shift.  Patient is borderline hypotensive and hypothermic. UA showed rare bacteria.  Chest x-ray was clear.  No obvious source of infection.   -Hold antibiotic therapy at this time. -We will get blood cultures and star AB if patient develops systemic signs of infection.    Acute  Kidney Injury:  Patient presents with an AKI with a creatinine on admission of 2.5 and no baseline to compare to.  Unknown if patient has a component of chronic kidney disease.  Creatinine continues to trend down with treatment of her DKA to 1.8 and a GFR of 34 today.  -Strict ins and outs -Continue fluid resuscitation -Monitor kidney function and urine output daily -Monitor electrolytes closely -Avoid nephrotoxic medications when possible.   Hypothyroidism Patient hypothermic today.  We will repeat TSH -Repeat TSH today -Restart home levothyroxine   HLD -Hold Statin while n.p.o.   Depression -Hold home Celexa, Paxil, trazodone until mental status improves  Best Practice:  Diet: NPO Pain/Anxiety/Delirium protocol (if indicated): not applicable,  VAP protocol (if indicated): not applicable DVT prophylaxis: prophylactic heparin  GI prophylaxis: N/A Glucose control: Insulin gtt Lines: N/A Foley:  N/A Mobility: bed rest Code Status: full code Family Communication: pending Disposition: ICU  Labs   CBC: Recent Labs  Lab 05/22/21 2016 05/22/21 2028 05/22/21 2029 05/22/21 2249 05/22/21 2259 05/23/21 0156  WBC 27.3*  --   --  27.8*  --  25.8*  NEUTROABS 19.5*  --   --  22.5*  --   --   HGB 15.7* 17.3* 16.7* 13.6 13.9 14.6  HCT 50.8* 51.0* 49.0* 42.8 41.0 44.7  MCV 103.0*  --   --  101.2*  --  98.5  PLT 280  --   --  252  --  509    Basic Metabolic Panel: Recent Labs  Lab 05/22/21 2016 05/22/21 2028 05/22/21 2029 05/22/21 2249 05/22/21 2259 05/23/21 0156  NA 135 134* 133* 137 135 136  K 5.1 5.1 5.0 5.2* 5.2* 4.6  CL 99 108  --  105  --  107  CO2 <7*  --   --  <7*  --  <7*  GLUCOSE 451* 434*  --  326*  --  197*  BUN 23* 24*  --  21*  --  19  CREATININE 2.15* 1.70*  --  1.95*  --  1.80*  CALCIUM 9.0  --   --  8.0*  --  8.0*  MG  --   --   --  2.4  --  2.1  PHOS  --   --   --  7.5*  --  4.7*   GFR: Estimated Creatinine Clearance: 33.6 mL/min (A) (by C-G  formula based on SCr of 1.8 mg/dL (H)). Recent Labs  Lab 05/22/21 2016 05/22/21 2249 05/23/21 0156  WBC 27.3* 27.8* 25.8*  LATICACIDVEN 8.0*  --  3.9*    Liver Function Tests: Recent Labs  Lab 05/22/21 2016  AST 51*  ALT 34  ALKPHOS 75  BILITOT 1.0  PROT 7.4  ALBUMIN 4.2   Recent Labs  Lab 05/22/21 2016  LIPASE 29   Recent Labs  Lab 05/22/21 2016  AMMONIA 126*    ABG    Component Value Date/Time   PHART 6.810 (LL) 05/22/2021 2050   PCO2ART <20 (LL) 05/22/2021 2050   PO2ART 122 (H) 05/22/2021 2050   HCO3 5.8 (L) 05/23/2021 0156   TCO2 <5 (L) 05/22/2021 2259   ACIDBASEDEF 23.5 (H) 05/23/2021 0156   O2SAT 60.3 05/23/2021 0156     Coagulation Profile: Recent Labs  Lab 05/22/21 2016  INR 1.2    Cardiac Enzymes: No results for input(s): CKTOTAL, CKMB, CKMBINDEX, TROPONINI in the last 168 hours.  HbA1C: No results found for: HGBA1C  CBG: Recent Labs  Lab 05/23/21 0004 05/23/21 0108 05/23/21 0212 05/23/21 0326 05/23/21 0442  GLUCAP 280* 216* 189* 180* 174*    Review of Systems:   See above  Past Medical History  She,  has no past medical history on file.   Surgical History      Social History      Family History   Her family history is not on file.   Allergies Allergies  Allergen Reactions   Januvia [Sitagliptin] Nausea And Vomiting   Sulfa Antibiotics      Home Medications  Prior to Admission medications   Medication Sig Start Date End Date Taking? Authorizing Provider  acarbose (PRECOSE) 25 MG tablet Take 25 mg by mouth 3 (three) times daily with meals.   Yes [provider]  albuterol (VENTOLIN HFA) 108 (90 Base) MCG/ACT inhaler Inhale 1-2 puffs into the lungs every 6 (six) hours as needed for wheezing or shortness of breath.   Yes [provider]  citalopram (CELEXA) 20 MG tablet Take 20 mg by mouth at bedtime.   Yes [provider]  dapagliflozin propanediol (FARXIGA) 10 MG TABS tablet Take 10 mg  by mouth daily.   Yes [provider]  ibuprofen (ADVIL) 200 MG tablet Take 400 mg by mouth every 6 (six) hours as needed for headache or moderate pain.   Yes [provider]  levothyroxine (SYNTHROID) 50 MCG tablet Take 50 mcg by mouth daily before breakfast.   Yes [provider]  Multiple Vitamins-Calcium (ONE-A-DAY WOMENS FORMULA PO) Take 1 tablet by mouth daily.   Yes [provider]  naproxen sodium (ALEVE) 220 MG tablet Take 220 mg by mouth daily as needed (headache).   Yes [provider]  PARoxetine (PAXIL) 10 MG tablet Take 10 mg by mouth daily.   Yes [provider]  rosuvastatin (CRESTOR) 20 MG tablet Take 20 mg by mouth at bedtime.   Yes [provider]  traZODone (DESYREL) 50 MG tablet Take 50-100 mg by mouth at bedtime as needed for sleep.   Yes [provider]  sitaGLIPtin (JANUVIA) 100 MG tablet Take 100 mg by mouth at bedtime. Patient not taking: Reported on 05/22/2021    [provider]     Critical care time: [place], D.O.  Internal Medicine Resident, PGY-3 [facility]  Pager: [phone] 5:41 AM, 05/23/2021

## 2021-05-23 NOTE — Progress Notes (Signed)
RN notified of mixed blood gas results.

## 2021-05-23 NOTE — Progress Notes (Addendum)
eLink Physician-Brief Progress Note Patient Name: Marcia [name] DOB: 06-05-1971 MRN: [id number]   Date of Service  05/23/2021  HPI/Events of Note  Received query from bedside regarding orders.    The patient is being treated for DKA and remains on insulin gtt. Last BMP was at 1345.   Patient is awake and alert, asking for ice chips.   BP 95/64, HR 95, RR 24, O2 sats 95%.  eICU Interventions  Get BMP now and then q4hrs.  Get venous blood gas. Ok to give ice chips.      Intervention Category Intermediate Interventions: Other:  Elsie Lincoln 05/23/2021, 10:40 PM  1:45 AM Notified of hypokalemia with K 2.9.  anion gap now at 12 but serum HCO3 still low at 15.   Plan> Replete K - 32meq PO now and 19meq KCL IV ordered.

## 2021-05-24 DIAGNOSIS — N179 Acute kidney failure, unspecified: Secondary | ICD-10-CM | POA: Diagnosis not present

## 2021-05-24 DIAGNOSIS — E1111 Type 2 diabetes mellitus with ketoacidosis with coma: Secondary | ICD-10-CM | POA: Diagnosis not present

## 2021-05-24 DIAGNOSIS — G9340 Encephalopathy, unspecified: Secondary | ICD-10-CM | POA: Diagnosis not present

## 2021-05-24 LAB — CBC WITH DIFFERENTIAL/PLATELET
Abs Immature Granulocytes: 0.1 10*3/uL — ABNORMAL HIGH (ref 0.00–0.07)
Basophils Absolute: 0 10*3/uL (ref 0.0–0.1)
Basophils Relative: 0 %
Eosinophils Absolute: 0 10*3/uL (ref 0.0–0.5)
Eosinophils Relative: 0 %
HCT: 34.7 % — ABNORMAL LOW (ref 36.0–46.0)
Hemoglobin: 12.7 g/dL (ref 12.0–15.0)
Immature Granulocytes: 1 %
Lymphocytes Relative: 13 %
Lymphs Abs: 1.9 10*3/uL (ref 0.7–4.0)
MCH: 31.6 pg (ref 26.0–34.0)
MCHC: 36.6 g/dL — ABNORMAL HIGH (ref 30.0–36.0)
MCV: 86.3 fL (ref 80.0–100.0)
Monocytes Absolute: 0.8 10*3/uL (ref 0.1–1.0)
Monocytes Relative: 5 %
Neutro Abs: 11.2 10*3/uL — ABNORMAL HIGH (ref 1.7–7.7)
Neutrophils Relative %: 81 %
Platelets: UNDETERMINED 10*3/uL (ref 150–400)
RBC: 4.02 MIL/uL (ref 3.87–5.11)
RDW: 12.8 % (ref 11.5–15.5)
WBC: 14 10*3/uL — ABNORMAL HIGH (ref 4.0–10.5)
nRBC: 0 % (ref 0.0–0.2)

## 2021-05-24 LAB — GLUCOSE, CAPILLARY
Glucose-Capillary: 137 mg/dL — ABNORMAL HIGH (ref 70–99)
Glucose-Capillary: 140 mg/dL — ABNORMAL HIGH (ref 70–99)
Glucose-Capillary: 147 mg/dL — ABNORMAL HIGH (ref 70–99)
Glucose-Capillary: 149 mg/dL — ABNORMAL HIGH (ref 70–99)
Glucose-Capillary: 150 mg/dL — ABNORMAL HIGH (ref 70–99)
Glucose-Capillary: 150 mg/dL — ABNORMAL HIGH (ref 70–99)
Glucose-Capillary: 153 mg/dL — ABNORMAL HIGH (ref 70–99)
Glucose-Capillary: 157 mg/dL — ABNORMAL HIGH (ref 70–99)
Glucose-Capillary: 162 mg/dL — ABNORMAL HIGH (ref 70–99)
Glucose-Capillary: 163 mg/dL — ABNORMAL HIGH (ref 70–99)
Glucose-Capillary: 165 mg/dL — ABNORMAL HIGH (ref 70–99)
Glucose-Capillary: 189 mg/dL — ABNORMAL HIGH (ref 70–99)
Glucose-Capillary: 193 mg/dL — ABNORMAL HIGH (ref 70–99)
Glucose-Capillary: 217 mg/dL — ABNORMAL HIGH (ref 70–99)
Glucose-Capillary: 265 mg/dL — ABNORMAL HIGH (ref 70–99)

## 2021-05-24 LAB — BASIC METABOLIC PANEL
Anion gap: 12 (ref 5–15)
Anion gap: 8 (ref 5–15)
Anion gap: 9 (ref 5–15)
BUN: 8 mg/dL (ref 6–20)
BUN: 9 mg/dL (ref 6–20)
BUN: 9 mg/dL (ref 6–20)
CO2: 15 mmol/L — ABNORMAL LOW (ref 22–32)
CO2: 18 mmol/L — ABNORMAL LOW (ref 22–32)
CO2: 20 mmol/L — ABNORMAL LOW (ref 22–32)
Calcium: 7.8 mg/dL — ABNORMAL LOW (ref 8.9–10.3)
Calcium: 7.9 mg/dL — ABNORMAL LOW (ref 8.9–10.3)
Calcium: 8 mg/dL — ABNORMAL LOW (ref 8.9–10.3)
Chloride: 110 mmol/L (ref 98–111)
Chloride: 110 mmol/L (ref 98–111)
Chloride: 112 mmol/L — ABNORMAL HIGH (ref 98–111)
Creatinine, Ser: 1.03 mg/dL — ABNORMAL HIGH (ref 0.44–1.00)
Creatinine, Ser: 1.08 mg/dL — ABNORMAL HIGH (ref 0.44–1.00)
Creatinine, Ser: 1.16 mg/dL — ABNORMAL HIGH (ref 0.44–1.00)
GFR, Estimated: 57 mL/min — ABNORMAL LOW (ref >60)
GFR, Estimated: >60 mL/min (ref >60)
GFR, Estimated: >60 mL/min (ref >60)
Glucose, Bld: 152 mg/dL — ABNORMAL HIGH (ref 70–99)
Glucose, Bld: 158 mg/dL — ABNORMAL HIGH (ref 70–99)
Glucose, Bld: 160 mg/dL — ABNORMAL HIGH (ref 70–99)
Potassium: 2.9 mmol/L — ABNORMAL LOW (ref 3.5–5.1)
Potassium: 3.1 mmol/L — ABNORMAL LOW (ref 3.5–5.1)
Potassium: 3.2 mmol/L — ABNORMAL LOW (ref 3.5–5.1)
Sodium: 137 mmol/L (ref 135–145)
Sodium: 138 mmol/L (ref 135–145)
Sodium: 139 mmol/L (ref 135–145)

## 2021-05-24 LAB — BETA-HYDROXYBUTYRIC ACID
Beta-Hydroxybutyric Acid: 3.27 mmol/L — ABNORMAL HIGH (ref 0.05–0.27)
Beta-Hydroxybutyric Acid: 2.26 mmol/L — ABNORMAL HIGH (ref 0.05–0.27)

## 2021-05-24 LAB — HEMOGLOBIN A1C
Hgb A1c MFr Bld: 8.7 % — ABNORMAL HIGH (ref 4.8–5.6)
Mean Plasma Glucose: 202.99 mg/dL

## 2021-05-24 MED ORDER — INSULIN ASPART 100 UNIT/ML IJ SOLN
0.0000 [IU] | Freq: Three times a day (TID) | INTRAMUSCULAR | Status: DC
  Administered 2021-05-25 (×2): 3 [IU] via SUBCUTANEOUS

## 2021-05-24 MED ORDER — INSULIN ASPART 100 UNIT/ML IJ SOLN: 4.0000 [IU] | Freq: Three times a day (TID) | INTRAMUSCULAR | Status: DC

## 2021-05-24 MED ORDER — LEVOTHYROXINE SODIUM 50 MCG PO TABS
50.0000 ug | ORAL_TABLET | Freq: Every day | ORAL | Status: DC
  Administered 2021-05-24 – 2021-05-25 (×2): 50 ug via ORAL
  Filled 2021-05-24 (×2): qty 1

## 2021-05-24 MED ORDER — ROSUVASTATIN CALCIUM 20 MG PO TABS
20.0000 mg | ORAL_TABLET | Freq: Every day | ORAL | Status: DC
  Administered 2021-05-24: 20 mg via ORAL
  Filled 2021-05-24: qty 1

## 2021-05-24 MED ORDER — INSULIN ASPART 100 UNIT/ML IJ SOLN: 0.0000 [IU] | Freq: Three times a day (TID) | INTRAMUSCULAR | Status: DC

## 2021-05-24 MED ORDER — POTASSIUM CHLORIDE 20 MEQ PO PACK
40.0000 meq | PACK | Freq: Two times a day (BID) | ORAL | Status: AC
  Administered 2021-05-24 (×2): 40 meq via ORAL
  Filled 2021-05-24 (×2): qty 2

## 2021-05-24 MED ORDER — POTASSIUM CHLORIDE CRYS ER 20 MEQ PO TBCR
40.0000 meq | EXTENDED_RELEASE_TABLET | Freq: Once | ORAL | Status: AC
  Administered 2021-05-24: 40 meq via ORAL
  Filled 2021-05-24: qty 2

## 2021-05-24 MED ORDER — INSULIN ASPART 100 UNIT/ML IJ SOLN
0.0000 [IU] | Freq: Three times a day (TID) | INTRAMUSCULAR | Status: DC
Start: 2021-05-24 — End: 2021-05-24
  Administered 2021-05-24 (×2): 3 [IU] via SUBCUTANEOUS

## 2021-05-24 MED ORDER — INSULIN STARTER KIT- PEN NEEDLES (ENGLISH)
1.0000 | Freq: Once | Status: AC
  Administered 2021-05-24: 1
  Filled 2021-05-24: qty 1

## 2021-05-24 MED ORDER — POTASSIUM CHLORIDE 10 MEQ/100ML IV SOLN
10.0000 meq | INTRAVENOUS | Status: AC
  Administered 2021-05-24 (×2): 10 meq via INTRAVENOUS
  Filled 2021-05-24 (×2): qty 100

## 2021-05-24 MED ORDER — INSULIN GLARGINE-YFGN 100 UNIT/ML ~~LOC~~ SOLN
12.0000 [IU] | SUBCUTANEOUS | Status: DC
  Administered 2021-05-24: 12 [IU] via SUBCUTANEOUS
  Filled 2021-05-24 (×2): qty 0.12

## 2021-05-24 NOTE — Progress Notes (Signed)
NAME:  Marcia [name], MRN:  [id number], DOB:  1970/10/31, LOS: 2 ADMISSION DATE:  05/22/2021, CONSULTATION DATE:  05/22/2021 REFERRING MD:  Maryan Rued, CHIEF COMPLAINT:  AMS   Brief History   Marcia [name] is a 50 y.o. with a pertinent PMH of DM, Hypothyroidism, Breast Ca (2010), who presented with fatigue, emesis and AMS after stopping her Acarbose 5 days prior and admitted for acute metabolic encephalopathy 2/2 DKA.   Past Medical History  DM Hypothyroidism Breast Cancer HLD Depression  Significant Hospital Events   10/16 admitted to PCCM, started on endo tool   Consults:  Neurology   Procedures:  N/A  Significant Diagnostic Tests:  CT Head Wo Contrast Result Date: 05/22/2021 FINDINGS: Brain: No evidence of acute infarction, hemorrhage, hydrocephalus, extra-axial collection or mass lesion/mass effect. Vascular: No hyperdense vessel or unexpected calcification. Skull: Normal. Negative for fracture or focal lesion. Sinuses/Orbits: No acute finding. Other: None. IMPRESSION: No acute intracranial abnormality noted.   CT CEREBRAL PERFUSION W CONTRAST Result Date: 05/22/2021 IMPRESSION: CTA head: 1. No large vessel occlusion or proximal hemodynamically significant stenosis. Limited distal evaluation due to venous timing. 2. Mild bilateral intracranial ICA stenosis due to calcific atherosclerosis. CTA neck: 1. Bilateral carotid bifurcation atherosclerosis with approximately 30-35% stenosis of the proximal left ICA. 2. Rotation of C1 on C2, most likely positional in the absence of a fixed torticollis. 3. Approximately 1 cm rounded ground-glass opacity within the visualized left upper lobe. This most likely represents pneumonia or aspiration; however, recommend follow-up chest CT to ensure resolution and exclude malignancy.  DG Chest Port 1 View Result Date: 05/22/2021 FINDINGS: Heart is normal size. No confluent airspace opacities or effusions. No acute bony abnormality.  IMPRESSION: No active disease.    CT ANGIO NECK CODE STROKE Result Date: 05/22/2021 IMPRESSION: CTA head: 1. No large vessel occlusion or proximal hemodynamically significant stenosis. Limited distal evaluation due to venous timing. 2. Mild bilateral intracranial ICA stenosis due to calcific atherosclerosis. CTA neck: 1. Bilateral carotid bifurcation atherosclerosis with approximately 30-35% stenosis of the proximal left ICA. 2. Rotation of C1 on C2, most likely positional in the absence of a fixed torticollis. 3. Approximately 1 cm rounded ground-glass opacity within the visualized left upper lobe. This most likely represents pneumonia or aspiration; however, recommend follow-up chest CT to ensure resolution and exclude malignancy.  Micro Data:  10/16 COVID negative 10/16 MRSA negative Antimicrobials:  none  Interim History/Subjective:  Overnight: no significant overnight events  In bed.  She is more alert and oriented.  She states that her symptoms have significantly improved since yesterday.  She denies any nausea, emesis, abdominal pain.  She states that her appetite has returned and is ready to eat  Objective:  Blood pressure (!) 87/47, pulse 95, temperature 99.4 F (37.4 C), temperature source Oral, resp. rate (!) 22, height 5\' 5"  (1.651 m), weight 61.7 kg, SpO2 94 %.        Intake/Output Summary (Last 24 hours) at 05/24/2021 0547 Last data filed at 05/24/2021 0500 Gross per 24 hour  Intake 3909.19 ml  Output 2880 ml  Net 1029.19 ml   Filed Weights   05/23/21 0001 05/23/21 0429 05/24/21 0500  Weight: 58.2 kg 58.2 kg 61.7 kg    Examination: General: Alert and oriented HEENT: PERRLA, and EO intact Lungs: Clear bilaterally Cardiovascular: Regular rate and regular rhythm no obvious murmurs rubs or gallops abdomen:  Extremities: Soft and nontender Neuro: No focal neurologic deficits GU: n/a  Patient Lines/Drains/Airways Status  Active Line/Drains/Airways     Name  Placement date Placement time Site Days   Peripheral IV 05/22/21 20 G Left Antecubital 05/22/21  2043  Antecubital  1   Peripheral IV 05/22/21 20 G Right Antecubital 05/22/21  2015  Antecubital  1   Peripheral IV 05/22/21 20 G Posterior;Right Forearm 05/22/21  2223  Forearm  1   External Urinary Catheter 05/22/21  2105  --  1            Resolved Hospital Problem list   AGMA w/ elevated BHB and Lactic acid Acute Metabolic Encephalopathy 2/2 Diabetic Ketoacidosis  Assessment & Plan:  Diabetes Mellitus, Type 2:  Patient significantly improved.  Gap closed x3. Will transition to SubQ insulin and start diet. - Will give 12 units of glargine and transition off insulin and D5 gtt - Start CM diet - Transfer out of ICU   Leukocytosis Leukocytosis improving today at 14.  Likely reactive in the setting of significant DKA. -Hold antibiotic therapy at this time. -We will get blood cultures and star AB if patient develops systemic signs of infection.    Acute Kidney Injury:  Patient's AKI was secondary to prerenal azotemia in the setting of significant DKA.  Creatinine improved today to 1.08 with a GFR greater than 60.   -Strict ins and outs -Monitor kidney function and urine output daily -Monitor electrolytes closely -Avoid nephrotoxic medications when possible.   Hypothyroidism -Restart home levothyroxine this morning.   HLD -Restart home statin this morning.   Depression - She endorses taking Celexa but is unsure about Paxil and trazodone.    Best Practice:  Diet: Regular consistency (see orders) and NPO Pain/Anxiety/Delirium protocol (if indicated): not applicable,  VAP protocol (if indicated): not applicable DVT prophylaxis: prophylactic heparin  GI prophylaxis: N/A Glucose control: Insulin gtt, SSI Yes, and Basal insulin Yes Lines: N/A Foley:  N/A Mobility: bed rest Code Status: full code Family Communication: pending Disposition: ICU >transition out of ICU  today  Labs   CBC: Recent Labs  Lab 05/22/21 2016 05/22/21 2028 05/22/21 2249 05/22/21 2259 05/23/21 0156 05/23/21 1519 05/23/21 2310  WBC 27.3*  --  27.8*  --  25.8*  --   --   NEUTROABS 19.5*  --  22.5*  --   --   --   --   HGB 15.7*   < > 13.6 13.9 14.6 11.6* 12.2  HCT 50.8*   < > 42.8 41.0 44.7 34.0* 36.0  MCV 103.0*  --  101.2*  --  98.5  --   --   PLT 280  --  252  --  200  --   --    < > = values in this interval not displayed.    Basic Metabolic Panel: Recent Labs  Lab 05/22/21 2249 05/22/21 2259 05/23/21 0156 05/23/21 0934 05/23/21 1345 05/23/21 1519 05/23/21 2310 05/23/21 2341 05/24/21 0308  NA 137   < > 136 137 139 139 139 139 137  K 5.2*   < > 4.6 4.3 3.9 3.4* 2.9* 2.9* 3.1*  CL 105  --  107 111 113*  --   --  112* 110  CO2 <7*  --  <7* 8* 13*  --   --  15* 18*  GLUCOSE 326*  --  197* 202* 147*  --   --  160* 152*  BUN 21*  --  19 15 13   --   --  9 9  CREATININE 1.95*  --  1.80*  1.59* 1.48*  --   --  1.16* 1.03*  CALCIUM 8.0*  --  8.0* 7.9* 7.7*  --   --  8.0* 7.9*  MG 2.4  --  2.1  --   --   --   --   --   --   PHOS 7.5*  --  4.7*  --   --   --   --   --   --    < > = values in this interval not displayed.   GFR: Estimated Creatinine Clearance: 58.8 mL/min (A) (by C-G formula based on SCr of 1.03 mg/dL (H)). Recent Labs  Lab 05/22/21 2016 05/22/21 2249 05/23/21 0156 05/23/21 0934 05/23/21 1345  WBC 27.3* 27.8* 25.8*  --   --   LATICACIDVEN 8.0*  --  3.9* 1.7 1.7    Liver Function Tests: Recent Labs  Lab 05/22/21 2016  AST 51*  ALT 34  ALKPHOS 75  BILITOT 1.0  PROT 7.4  ALBUMIN 4.2   Recent Labs  Lab 05/22/21 2016  LIPASE 29   Recent Labs  Lab 05/22/21 2016  AMMONIA 126*    ABG    Component Value Date/Time   PHART 7.402 05/23/2021 2310   PCO2ART 21.3 (L) 05/23/2021 2310   PO2ART 40 (LL) 05/23/2021 2310   HCO3 17.7 (L) 05/23/2021 2344   TCO2 14 (L) 05/23/2021 2310   ACIDBASEDEF 6.3 (H) 05/23/2021 2344   O2SAT 71.9  05/23/2021 2344     Coagulation Profile: Recent Labs  Lab 05/22/21 2016  INR 1.2    Cardiac Enzymes: No results for input(s): CKTOTAL, CKMB, CKMBINDEX, TROPONINI in the last 168 hours.  HbA1C: No results found for: HGBA1C  CBG: Recent Labs  Lab 05/24/21 0141 05/24/21 0227 05/24/21 0335 05/24/21 0443 05/24/21 0533  GLUCAP 157* 140* 162* 153* 149*    Review of Systems:   See above  Past Medical History  She,  has no past medical history on file.   Surgical History      Social History      Family History   Her family history is not on file.   Allergies Allergies  Allergen Reactions   Januvia [Sitagliptin] Nausea And Vomiting   Sulfa Antibiotics      Home Medications  Prior to Admission medications   Medication Sig Start Date End Date Taking? Authorizing Provider  acarbose (PRECOSE) 25 MG tablet Take 25 mg by mouth 3 (three) times daily with meals.   Yes [provider]  albuterol (VENTOLIN HFA) 108 (90 Base) MCG/ACT inhaler Inhale 1-2 puffs into the lungs every 6 (six) hours as needed for wheezing or shortness of breath.   Yes [provider]  citalopram (CELEXA) 20 MG tablet Take 20 mg by mouth at bedtime.   Yes [provider]  dapagliflozin propanediol (FARXIGA) 10 MG TABS tablet Take 10 mg by mouth daily.   Yes [provider]  ibuprofen (ADVIL) 200 MG tablet Take 400 mg by mouth every 6 (six) hours as needed for headache or moderate pain.   Yes [provider]  levothyroxine (SYNTHROID) 50 MCG tablet Take 50 mcg by mouth daily before breakfast.   Yes [provider]  Multiple Vitamins-Calcium (ONE-A-DAY WOMENS FORMULA PO) Take 1 tablet by mouth daily.   Yes [provider]  naproxen sodium (ALEVE) 220 MG tablet Take 220 mg by mouth daily as needed (headache).   Yes [provider]  PARoxetine (PAXIL) 10 MG tablet Take 10 mg by  mouth daily.   Yes [provider]  rosuvastatin  (CRESTOR) 20 MG tablet Take 20 mg by mouth at bedtime.   Yes [provider]  traZODone (DESYREL) 50 MG tablet Take 50-100 mg by mouth at bedtime as needed for sleep.   Yes [provider]  sitaGLIPtin (JANUVIA) 100 MG tablet Take 100 mg by mouth at bedtime. Patient not taking: Reported on 05/22/2021    [provider]     Critical care time: [place], D.O.  Internal Medicine Resident, PGY-3 [facility]  Pager: [phone] 5:47 AM, 05/24/2021

## 2021-05-24 NOTE — Discharge Instructions (Signed)
Local Endocrinologists [facility] ([phone]) Dr. Cristina Gherghe Dr. Ajay Kumar [facility] ([phone]) Dr. Jeffrey Kerr [facility] ([phone]) Dr. Bindubal Balan Dr. Walter Kohut [facility] ([phone]) Dr. Stephen [facility] ([phone]) [[place] office]  ([phone]) [[place] office] Dr. Melissa Solum Dr. Thomas O'Connell [facility] ([facility]) ([phone]) Autumn Hudnall ([place]), [place] Dr. Dhaval Patel Dr. William Smith Jr. [facility] ([phone]) Dr. Gebre Nida [facility] ([phone]) Dr. Micheal Brennan Dr. Jennifer Badik Dr. Ashley Jessup Spencer Beasley, FNP Dr. Monica E. Doerr in [place] ([phone]) 

## 2021-05-24 NOTE — Progress Notes (Addendum)
Inpatient Diabetes Program Recommendations  AACE/ADA: New Consensus Statement on Inpatient Glycemic Control (2015)  Target Ranges:  Prepandial:   less than 140 mg/dL      Peak postprandial:   less than 180 mg/dL (1-2 hours)      Critically ill patients:  140 - 180 mg/dL   Lab Results  Component Value Date   GLUCAP 163 (H) 05/24/2021   HGBA1C 8.7 (H) 05/24/2021    Review of Glycemic Control Results for Marcia [name], Marcia [name] (MRN [id number]) as of 05/24/2021 13:13  Ref. Range 05/24/2021 08:52 05/24/2021 10:06 05/24/2021 11:12 05/24/2021 12:25  Glucose-Capillary Latest Ref Range: 70 - 99 mg/dL 193 (H) 189 (H) 217 (H) 163 (H)   Diabetes history: Type 2 DM Outpatient Diabetes medications: Farxiga 10 mg QD, Trulicity 3 mg qwk, Tresiba 48 units qhs (DCd by NP outpt 1 month ago), Januvia QD, Acrobose (self Dcd) Current orders for Inpatient glycemic control: Transitioning to Semglee 12 units QD, Novolog 0-15 units TID   Inpatient Diabetes Program Recommendations:    Spoke with patient at length regarding outpatient diabetes management. Patient was recently switched from [place] 48 units qhs to Januvia and all insulin injections were stopped. Patient has not been on Novolog in over 6 months. NP switched in attempted to improve A1C. Symptoms began within two weeks of stopping [place]. Would recommend restarting Tresiba at discharge.  Reviewed patient's current A1c of 8.7%. Explained what a A1c is and what it measures. Also reviewed goal A1c with patient, importance of good glucose control @ home, and blood sugar goals. Reviewed patho of DM, DKA, need for insulin, DPP4 vs basal insulin roles, vascular changes and commorbidities. Patient has meter and supplies. Was checking 4 times per day; fastings and with meals. Average blood sugar prior to stopping Tyler Aas was 140's mg/dL. Discussed Freestyle Libre benefits, applications, and associated cost. Feel that patient could evaluate post prandials to  determine need for outpatient Novolog with meals.  Discussed need/role of outpatient endocrinology and will attach list to DC summary. Patient to make appointment and has no further questions at this time.   Addendum: Applied Freestyle Libre to patient's right upper arm. All questions answered and reviewed. Patient to scheduled follow up.  Thanks, Bronson Curb, MSN, RNC-OB Diabetes Coordinator [phone] (8a-5p)

## 2021-05-24 NOTE — Plan of Care (Signed)
Nutrition Education Note  RD consulted for nutrition education regarding diabetes. Spoke with pt at bedside and reviewed Diabetes Coordinator note.  Per notes, pt was recently switched from [place] 48 units qhs to Januvia and all insulin injections were stopped. Pt believes this to be the reason why she presented with severe DKA. Pt states, "I need my insulin."  Pt reports typically having a good appetite and eating well at home. She checks her blood sugars 4 times daily. Pt eats balanced meals and does not eat large portions of carbohydrates. Pt tends to eat a diet high in protein foods and non-starchy vegetables.  Lab Results  Component Value Date   HGBA1C 8.7 (H) 05/24/2021    RD provided "The Plate Method for People with Diabetes" handout from the Academy of [employer] Dietetics. Discussed different food groups and their effects on blood sugar, emphasizing carbohydrate-containing foods.  Discussed importance of controlled and consistent carbohydrate intake throughout the day. Provided examples of ways to balance meals/snacks and encouraged intake of high-fiber, whole grain complex carbohydrates. Teach back method used.  Expect good compliance.  Current diet order is Carb Modified. No meal completions charted at this time. Labs and medications reviewed. No further nutrition interventions warranted at this time. If additional nutrition issues arise, please re-consult RD.   Gustavus Bryant, MS, RD, LDN Inpatient Clinical Dietitian Please see [facility] for contact information.

## 2021-05-24 NOTE — Progress Notes (Signed)
Patient will be transferred out of ICU. TR[facility] will take over care tomorrow. Patient placement contacted and agreed.

## 2021-05-25 DIAGNOSIS — E1111 Type 2 diabetes mellitus with ketoacidosis with coma: Secondary | ICD-10-CM | POA: Diagnosis not present

## 2021-05-25 LAB — BASIC METABOLIC PANEL
Anion gap: 12 (ref 5–15)
BUN: 7 mg/dL (ref 6–20)
CO2: 19 mmol/L — ABNORMAL LOW (ref 22–32)
Calcium: 8.4 mg/dL — ABNORMAL LOW (ref 8.9–10.3)
Chloride: 104 mmol/L (ref 98–111)
Creatinine, Ser: 1.07 mg/dL — ABNORMAL HIGH (ref 0.44–1.00)
GFR, Estimated: >60 mL/min (ref >60)
Glucose, Bld: 202 mg/dL — ABNORMAL HIGH (ref 70–99)
Potassium: 3.2 mmol/L — ABNORMAL LOW (ref 3.5–5.1)
Sodium: 135 mmol/L (ref 135–145)

## 2021-05-25 LAB — GLUCOSE, CAPILLARY
Glucose-Capillary: 199 mg/dL — ABNORMAL HIGH (ref 70–99)
Glucose-Capillary: 157 mg/dL — ABNORMAL HIGH (ref 70–99)

## 2021-05-25 MED ORDER — INSULIN DEGLUDEC 100 UNIT/ML ~~LOC~~ SOPN: 48.0000 [IU] | PEN_INJECTOR | Freq: Every day | SUBCUTANEOUS | Status: AC

## 2021-05-25 NOTE — TOC Transition Note (Signed)
Transition of Care [facility]) - CM/SW Discharge Note   Patient Details  Name: Marcia [name] MRN: [id number] Date of Birth: Jan 20, 1971  [facility] CM/SW Contact:  Tom-Johnson, Renea Ee, RN Phone Number: 05/25/2021, 12:00 PM   Clinical Narrative:    CM spoke with patient at bedside about post hospital transition needs. Patient states she is feeling better and all she wants it to go home. Denies any needs. Scheduled for discharge and husband at bedside to transport. No further TOC needs noted.    Final next level of care: Home/Self Care Barriers to Discharge: No Barriers Identified   Patient Goals and CMS Choice Patient states their goals for this hospitalization and ongoing recovery are:: To go home CMS Medicare.gov Compare Post Acute Care list provided to:: Patient Choice offered to / list presented to : NA  Discharge Placement                       Discharge Plan and Services   Discharge Planning Services: CM Consult            DME Arranged: N/A DME Agency: NA       HH Arranged: NA HH Agency: NA        Social Determinants of Health (SDOH) Interventions     Readmission Risk Interventions No flowsheet data found.

## 2021-05-25 NOTE — Discharge Summary (Signed)
Physician Discharge Summary  Marcia [name] [facility] NWG:[id number] DOB: 1970-09-09 DOA: 05/22/2021  PCP: Danelle Berry, NP  Admit date: 05/22/2021 Discharge date: 05/25/2021  Admitted From: Home.  Disposition:  Home  Recommendations for Outpatient Follow-up:  Follow up with PCP in 1-2 weeks Please obtain BMP/CBC in one week Please follow up with your endocrinology in 2 weeks.   Discharge Condition:stable.  CODE STATUS:full code.  Diet recommendation: Heart Healthy / Carb Modified   Brief/Interim Summary: Marcia [name] is a 50 y.o. with a pertinent PMH of DM, Hypothyroidism, Breast Ca (2010), who presented with fatigue, emesis and AMS after stopping her Acarbose 5 days prior and admitted for acute metabolic encephalopathy 2/2 DKA.   Discharge Diagnoses:  Active Problems:   DKA (diabetic ketoacidosis) ([place])  Diabetes Mellitus, Type 2: / dka secondary to stopping [place]. Insulin dependent, better controlled CBG'S Discussed with the patient, who wishes to go back to her regimen.  Continue with [place] on discharge.  CBG (last 3)  Recent Labs    05/24/21 2114 05/25/21 0154 05/25/21 0629  GLUCAP 265* 199* 157*      Leukocytosis Leukocytosis improving today at 14.  Likely reactive in the setting of significant DKA. -Hold antibiotic therapy at this time. -cultures have been negative so far.     Acute Kidney Injury:  Patient's AKI was secondary to prerenal azotemia in the setting of significant DKA.  Creatinine improved today to 1.08 with a GFR greater than 60.   Creatinine appears to be back to baseline.   Hypothyroidism -Restart home levothyroxine this morning.   HLD -Restart home statin on discharge.    Depression - She endorses taking Celexa but is unsure about Paxil . We will discontinue paxil.   Hypokalemia  Replaced.   Discharge Instructions  Discharge Instructions     Diet - low sodium heart healthy   Complete by: As directed     Discharge instructions   Complete by: As directed    Please follow up with endocrinology in 1 to 2 week.   Increase activity slowly   Complete by: As directed       Allergies as of 05/25/2021       Reactions   Januvia [sitagliptin] Nausea And Vomiting   Sulfa Antibiotics         Medication List     STOP taking these medications    ibuprofen 200 MG tablet Commonly known as: ADVIL   naproxen sodium 220 MG tablet Commonly known as: ALEVE   PARoxetine 10 MG tablet Commonly known as: PAXIL   sitaGLIPtin 100 MG tablet Commonly known as: JANUVIA       TAKE these medications    acarbose 25 MG tablet Commonly known as: PRECOSE Take 25 mg by mouth 3 (three) times daily with meals.   albuterol 108 (90 Base) MCG/ACT inhaler Commonly known as: VENTOLIN HFA Inhale 1-2 puffs into the lungs every 6 (six) hours as needed for wheezing or shortness of breath.   citalopram 20 MG tablet Commonly known as: CELEXA Take 20 mg by mouth at bedtime.   dapagliflozin propanediol 10 MG Tabs tablet Commonly known as: FARXIGA Take 10 mg by mouth daily.   insulin degludec 100 UNIT/ML FlexTouch Pen Commonly known as: TRESIBA Inject 48 Units into the skin at bedtime.   levothyroxine 50 MCG tablet Commonly known as: SYNTHROID Take 50 mcg by mouth daily before breakfast.   ONE-A-DAY WOMENS FORMULA PO Take 1 tablet by mouth daily.  rosuvastatin 20 MG tablet Commonly known as: CRESTOR Take 20 mg by mouth at bedtime.   traZODone 50 MG tablet Commonly known as: DESYREL Take 50-100 mg by mouth at bedtime as needed for sleep.        Allergies  Allergen Reactions   Januvia [Sitagliptin] Nausea And Vomiting   Sulfa Antibiotics     Consultations: PCCM   Procedures/Studies: CT Head Wo Contrast  Result Date: 05/22/2021 CLINICAL DATA:  Altered mental status, found unresponsive EXAM: CT HEAD WITHOUT CONTRAST TECHNIQUE: Contiguous axial images were obtained from the base of  the skull through the vertex without intravenous contrast. COMPARISON:  None. FINDINGS: Brain: No evidence of acute infarction, hemorrhage, hydrocephalus, extra-axial collection or mass lesion/mass effect. Vascular: No hyperdense vessel or unexpected calcification. Skull: Normal. Negative for fracture or focal lesion. Sinuses/Orbits: No acute finding. Other: None. IMPRESSION: No acute intracranial abnormality noted. Electronically Signed   By: Inez Catalina M.D.   On: 05/22/2021 20:16   CT CEREBRAL PERFUSION W CONTRAST  Result Date: 05/22/2021 CLINICAL DATA:  Neuro deficit, acute, stroke suspected; Stroke/TIA, assess intracranial arteries; Stroke/TIA, assess extracranial arteries EXAM: CT ANGIOGRAPHY HEAD AND NECK TECHNIQUE: Multidetector CT imaging of the head and neck was performed using the standard protocol during bolus administration of intravenous contrast. Multiplanar CT image reconstructions and MIPs were obtained to evaluate the vascular anatomy. Carotid stenosis measurements (when applicable) are obtained utilizing NASCET criteria, using the distal internal carotid diameter as the denominator. CONTRAST:  60cc omni 350 COMPARISON:  Same day CT head. FINDINGS: CTA NECK FINDINGS Aortic arch: Great vessel origins are patent. Right carotid system: Mild atherosclerosis at the carotid bifurcation without greater than 50% stenosis. Left carotid system: Atherosclerosis of the carotid bifurcation with approximately 30-35% stenosis. Vertebral arteries: Right dominant. No evidence of significant (greater than 50%) stenosis. Skeleton: C4-C7 ACDF. Straightening of the normal cervical lordosis. Rotation of C1 on C2. Other neck: No acute findings.  Small thyroid.  Six Upper chest: Approximately 1 cm rounded ground-glass opacity within the visualized left upper lobe. Review of the MIP images confirms the above findings CTA HEAD FINDINGS Anterior circulation: Bilateral intracranial ICAs, MCAs, and ACAs are patent  without proximal hemodynamically significant stenosis. Mild bilateral ICA stenosis due to calcific atherosclerosis. Limited evaluation of the distal MCAs bilaterally due to venous contamination. Posterior circulation: Right dominant vertebral artery. Bilateral intradural vertebral arteries, basilar artery, and posterior cerebral arteries are patent without proximal hemodynamically significant stenosis. Limited distal PCA evaluation due to venous contamination. No aneurysm identified. Venous sinuses: As permitted by contrast timing, patent. Review of the MIP images confirms the above findings IMPRESSION: CTA head: 1. No large vessel occlusion or proximal hemodynamically significant stenosis. Limited distal evaluation due to venous timing. 2. Mild bilateral intracranial ICA stenosis due to calcific atherosclerosis. CTA neck: 1. Bilateral carotid bifurcation atherosclerosis with approximately 30-35% stenosis of the proximal left ICA. 2. Rotation of C1 on C2, most likely positional in the absence of a fixed torticollis. 3. Approximately 1 cm rounded ground-glass opacity within the visualized left upper lobe. This most likely represents pneumonia or aspiration; however, recommend follow-up chest CT to ensure resolution and exclude malignancy. CTA head impression #1 discussed with Dr. Lorrin Goodell via telephone at 8:40 p.m. Electronically Signed   By: Margaretha Sheffield M.D.   On: 05/22/2021 20:52   DG Chest Port 1 View  Result Date: 05/22/2021 CLINICAL DATA:  Altered mental status EXAM: PORTABLE CHEST 1 VIEW COMPARISON:  07/30/2013 FINDINGS: Heart is normal size. No confluent  airspace opacities or effusions. No acute bony abnormality. IMPRESSION: No active disease. Electronically Signed   By: Rolm Baptise M.D.   On: 05/22/2021 21:27   CT ANGIO HEAD CODE STROKE  Result Date: 05/22/2021 CLINICAL DATA:  Neuro deficit, acute, stroke suspected; Stroke/TIA, assess intracranial arteries; Stroke/TIA, assess extracranial  arteries EXAM: CT ANGIOGRAPHY HEAD AND NECK TECHNIQUE: Multidetector CT imaging of the head and neck was performed using the standard protocol during bolus administration of intravenous contrast. Multiplanar CT image reconstructions and MIPs were obtained to evaluate the vascular anatomy. Carotid stenosis measurements (when applicable) are obtained utilizing NASCET criteria, using the distal internal carotid diameter as the denominator. CONTRAST:  60cc omni 350 COMPARISON:  Same day CT head. FINDINGS: CTA NECK FINDINGS Aortic arch: Great vessel origins are patent. Right carotid system: Mild atherosclerosis at the carotid bifurcation without greater than 50% stenosis. Left carotid system: Atherosclerosis of the carotid bifurcation with approximately 30-35% stenosis. Vertebral arteries: Right dominant. No evidence of significant (greater than 50%) stenosis. Skeleton: C4-C7 ACDF. Straightening of the normal cervical lordosis. Rotation of C1 on C2. Other neck: No acute findings.  Small thyroid.  Six Upper chest: Approximately 1 cm rounded ground-glass opacity within the visualized left upper lobe. Review of the MIP images confirms the above findings CTA HEAD FINDINGS Anterior circulation: Bilateral intracranial ICAs, MCAs, and ACAs are patent without proximal hemodynamically significant stenosis. Mild bilateral ICA stenosis due to calcific atherosclerosis. Limited evaluation of the distal MCAs bilaterally due to venous contamination. Posterior circulation: Right dominant vertebral artery. Bilateral intradural vertebral arteries, basilar artery, and posterior cerebral arteries are patent without proximal hemodynamically significant stenosis. Limited distal PCA evaluation due to venous contamination. No aneurysm identified. Venous sinuses: As permitted by contrast timing, patent. Review of the MIP images confirms the above findings IMPRESSION: CTA head: 1. No large vessel occlusion or proximal hemodynamically significant  stenosis. Limited distal evaluation due to venous timing. 2. Mild bilateral intracranial ICA stenosis due to calcific atherosclerosis. CTA neck: 1. Bilateral carotid bifurcation atherosclerosis with approximately 30-35% stenosis of the proximal left ICA. 2. Rotation of C1 on C2, most likely positional in the absence of a fixed torticollis. 3. Approximately 1 cm rounded ground-glass opacity within the visualized left upper lobe. This most likely represents pneumonia or aspiration; however, recommend follow-up chest CT to ensure resolution and exclude malignancy. CTA head impression #1 discussed with Dr. Lorrin Goodell via telephone at 8:40 p.m. Electronically Signed   By: Margaretha Sheffield M.D.   On: 05/22/2021 20:52   CT ANGIO NECK CODE STROKE  Result Date: 05/22/2021 CLINICAL DATA:  Neuro deficit, acute, stroke suspected; Stroke/TIA, assess intracranial arteries; Stroke/TIA, assess extracranial arteries EXAM: CT ANGIOGRAPHY HEAD AND NECK TECHNIQUE: Multidetector CT imaging of the head and neck was performed using the standard protocol during bolus administration of intravenous contrast. Multiplanar CT image reconstructions and MIPs were obtained to evaluate the vascular anatomy. Carotid stenosis measurements (when applicable) are obtained utilizing NASCET criteria, using the distal internal carotid diameter as the denominator. CONTRAST:  60cc omni 350 COMPARISON:  Same day CT head. FINDINGS: CTA NECK FINDINGS Aortic arch: Great vessel origins are patent. Right carotid system: Mild atherosclerosis at the carotid bifurcation without greater than 50% stenosis. Left carotid system: Atherosclerosis of the carotid bifurcation with approximately 30-35% stenosis. Vertebral arteries: Right dominant. No evidence of significant (greater than 50%) stenosis. Skeleton: C4-C7 ACDF. Straightening of the normal cervical lordosis. Rotation of C1 on C2. Other neck: No acute findings.  Small thyroid.  Six Upper  chest: Approximately 1  cm rounded ground-glass opacity within the visualized left upper lobe. Review of the MIP images confirms the above findings CTA HEAD FINDINGS Anterior circulation: Bilateral intracranial ICAs, MCAs, and ACAs are patent without proximal hemodynamically significant stenosis. Mild bilateral ICA stenosis due to calcific atherosclerosis. Limited evaluation of the distal MCAs bilaterally due to venous contamination. Posterior circulation: Right dominant vertebral artery. Bilateral intradural vertebral arteries, basilar artery, and posterior cerebral arteries are patent without proximal hemodynamically significant stenosis. Limited distal PCA evaluation due to venous contamination. No aneurysm identified. Venous sinuses: As permitted by contrast timing, patent. Review of the MIP images confirms the above findings IMPRESSION: CTA head: 1. No large vessel occlusion or proximal hemodynamically significant stenosis. Limited distal evaluation due to venous timing. 2. Mild bilateral intracranial ICA stenosis due to calcific atherosclerosis. CTA neck: 1. Bilateral carotid bifurcation atherosclerosis with approximately 30-35% stenosis of the proximal left ICA. 2. Rotation of C1 on C2, most likely positional in the absence of a fixed torticollis. 3. Approximately 1 cm rounded ground-glass opacity within the visualized left upper lobe. This most likely represents pneumonia or aspiration; however, recommend follow-up chest CT to ensure resolution and exclude malignancy. CTA head impression #1 discussed with Dr. Lorrin Goodell via telephone at 8:40 p.m. Electronically Signed   By: Margaretha Sheffield M.D.   On: 05/22/2021 20:52      Subjective:  No new complaints.  Discharge Exam: Vitals:   05/24/21 2113 05/25/21 0530  BP: 106/77 111/70  Pulse: 86 84  Resp: 18   Temp: 99.1 F (37.3 C) 98.2 F (36.8 C)  SpO2: 98% 96%   Vitals:   05/24/21 1650 05/24/21 2113 05/25/21 0500 05/25/21 0530  BP: 109/70 106/77  111/70  Pulse: 80  86  84  Resp: 17 18    Temp: 98.1 F (36.7 C) 99.1 F (37.3 C)  98.2 F (36.8 C)  TempSrc: Oral Oral  Oral  SpO2: 96% 98%  96%  Weight:  61.2 kg 60.4 kg   Height:        General: Pt is alert, awake, not in acute distress Cardiovascular: RRR, S1/S2 +, no rubs, no gallops Respiratory: CTA bilaterally, no wheezing, no rhonchi Abdominal: Soft, NT, ND, bowel sounds + Extremities: no edema, no cyanosis    The results of significant diagnostics from this hospitalization (including imaging, microbiology, ancillary and laboratory) are listed below for reference.     Microbiology: Recent Results (from the past 240 hour(s))  Resp Panel by RT-PCR (Flu A&B, Covid) Nasopharyngeal Swab     Status: None   Collection Time: 05/22/21  8:07 PM   Specimen: Nasopharyngeal Swab; Nasopharyngeal(NP) swabs in vial transport medium  Result Value Ref Range Status   SARS Coronavirus 2 by RT PCR NEGATIVE NEGATIVE Final    Comment: (NOTE) SARS-CoV-2 target nucleic acids are NOT DETECTED.  The SARS-CoV-2 RNA is generally detectable in upper respiratory specimens during the acute phase of infection. The lowest concentration of SARS-CoV-2 viral copies this assay can detect is 138 copies/mL. A negative result does not preclude SARS-Cov-2 infection and should not be used as the sole basis for treatment or other patient management decisions. A negative result may occur with  improper specimen collection/handling, submission of specimen other than nasopharyngeal swab, presence of viral mutation(s) within the areas targeted by this assay, and inadequate number of viral copies(<138 copies/mL). A negative result must be combined with clinical observations, patient history, and epidemiological information. The expected result is Negative.  Fact Sheet for  Patients:  EntrepreneurPulse.com.au  Fact Sheet for Healthcare Providers:  IncredibleEmployment.be  This test is no t  yet approved or cleared by the [place] FDA and  has been authorized for detection and/or diagnosis of SARS-CoV-2 by FDA under an Emergency Use Authorization (EUA). This EUA will remain  in effect (meaning this test can be used) for the duration of the COVID-19 declaration under Section 564(b)(1) of the Act, 21 U.S.C.section 360bbb-3(b)(1), unless the authorization is terminated  or revoked sooner.       Influenza A by PCR NEGATIVE NEGATIVE Final   Influenza B by PCR NEGATIVE NEGATIVE Final    Comment: (NOTE) The Xpert Xpress SARS-CoV-2/FLU/RSV plus assay is intended as an aid in the diagnosis of influenza from Nasopharyngeal swab specimens and should not be used as a sole basis for treatment. Nasal washings and aspirates are unacceptable for Xpert Xpress SARS-CoV-2/FLU/RSV testing.  Fact Sheet for Patients: EntrepreneurPulse.com.au  Fact Sheet for Healthcare Providers: IncredibleEmployment.be  This test is not yet approved or cleared by the [place] FDA and has been authorized for detection and/or diagnosis of SARS-CoV-2 by FDA under an Emergency Use Authorization (EUA). This EUA will remain in effect (meaning this test can be used) for the duration of the COVID-19 declaration under Section 564(b)(1) of the Act, 21 U.S.C. section 360bbb-3(b)(1), unless the authorization is terminated or revoked.  Performed at [facility], [place] [address] [place], [place] [address]   Urine Culture     Status: None   Collection Time: 05/22/21  8:58 PM   Specimen: In/Out Cath Urine  Result Value Ref Range Status   Specimen Description IN/OUT CATH URINE  Final   Special Requests NONE  Final   Culture   Final    NO GROWTH Performed at [facility], [place] [address], [place], [place] [address]    Report Status 05/23/2021 FINAL  Final  MRSA Next Gen by PCR, Nasal     Status: None   Collection Time: 05/22/21 11:54 PM   Specimen: Nasal  Mucosa; Nasal Swab  Result Value Ref Range Status   MRSA by PCR Next Gen NOT DETECTED NOT DETECTED Final    Comment: (NOTE) The GeneXpert MRSA Assay (FDA approved for NASAL specimens only), is one component of a comprehensive MRSA colonization surveillance program. It is not intended to diagnose MRSA infection nor to guide or monitor treatment for MRSA infections. Test performance is not FDA approved in patients less than 9 years old. Performed at [facility], [place] [address] [place], [place] [address]      Labs: BNP (last 3 results) No results for input(s): BNP in the last 8760 hours. Basic Metabolic Panel: Recent Labs  Lab 05/22/21 2249 05/22/21 2259 05/23/21 0156 05/23/21 0934 05/23/21 1345 05/23/21 1519 05/23/21 2310 05/23/21 2341 05/24/21 0308 05/24/21 0617 05/25/21 0114  NA 137   < > 136   < > 139   < > 139 139 137 138 135  K 5.2*   < > 4.6   < > 3.9   < > 2.9* 2.9* 3.1* 3.2* 3.2*  CL 105  --  107   < > 113*  --   --  112* 110 110 104  CO2 <7*  --  <7*   < > 13*  --   --  15* 18* 20* 19*  GLUCOSE 326*  --  197*   < > 147*  --   --  160* 152* 158* 202*  BUN 21*  --  19   < > 13  --   --  9 9 8 7   CREATININE 1.95*  --  1.80*   < > 1.48*  --   --  1.16* 1.03* 1.08* 1.07*  CALCIUM 8.0*  --  8.0*   < > 7.7*  --   --  8.0* 7.9* 7.8* 8.4*  MG 2.4  --  2.1  --   --   --   --   --   --   --   --   PHOS 7.5*  --  4.7*  --   --   --   --   --   --   --   --    < > = values in this interval not displayed.   Liver Function Tests: Recent Labs  Lab 05/22/21 2016  AST 51*  ALT 34  ALKPHOS 75  BILITOT 1.0  PROT 7.4  ALBUMIN 4.2   Recent Labs  Lab 05/22/21 2016  LIPASE 29   Recent Labs  Lab 05/22/21 2016  AMMONIA 126*   CBC: Recent Labs  Lab 05/22/21 2016 05/22/21 2028 05/22/21 2249 05/22/21 2259 05/23/21 0156 05/23/21 1519 05/23/21 2310 05/24/21 0617  WBC 27.3*  --  27.8*  --  25.8*  --   --  14.0*  NEUTROABS 19.5*  --  22.5*  --   --   --   --   11.2*  HGB 15.7*   < > 13.6 13.9 14.6 11.6* 12.2 12.7  HCT 50.8*   < > 42.8 41.0 44.7 34.0* 36.0 34.7*  MCV 103.0*  --  101.2*  --  98.5  --   --  86.3  PLT 280  --  252  --  200  --   --  PLATELET CLUMPS NOTED ON SMEAR, UNABLE TO ESTIMATE   < > = values in this interval not displayed.   Cardiac Enzymes: No results for input(s): CKTOTAL, CKMB, CKMBINDEX, TROPONINI in the last 168 hours. BNP: Invalid input(s): POCBNP CBG: Recent Labs  Lab 05/24/21 1510 05/24/21 1701 05/24/21 2114 05/25/21 0154 05/25/21 0629  GLUCAP 165* 147* 265* 199* 157*   D-Dimer No results for input(s): DDIMER in the last 72 hours. Hgb A1c Recent Labs    05/24/21 1030  HGBA1C 8.7*   Lipid Profile No results for input(s): CHOL, HDL, LDLCALC, TRIG, CHOLHDL, LDLDIRECT in the last 72 hours. Thyroid function studies Recent Labs    05/23/21 0156  TSH 1.097   Anemia work up No results for input(s): VITAMINB12, FOLATE, FERRITIN, TIBC, IRON, RETICCTPCT in the last 72 hours. Urinalysis    Component Value Date/Time   COLORURINE YELLOW 05/22/2021 2058   APPEARANCEUR HAZY (A) 05/22/2021 2058   LABSPEC 1.023 05/22/2021 2058   PHURINE 5.0 05/22/2021 2058   GLUCOSEU >=500 (A) 05/22/2021 2058   HGBUR SMALL (A) 05/22/2021 2058   BILIRUBINUR NEGATIVE 05/22/2021 2058   KETONESUR 80 (A) 05/22/2021 2058   PROTEINUR 100 (A) 05/22/2021 2058   NITRITE NEGATIVE 05/22/2021 2058   LEUKOCYTESUR NEGATIVE 05/22/2021 2058   Sepsis Labs Invalid input(s): PROCALCITONIN,  WBC,  LACTICIDVEN Microbiology Recent Results (from the past 240 hour(s))  Resp Panel by RT-PCR (Flu A&B, Covid) Nasopharyngeal Swab     Status: None   Collection Time: 05/22/21  8:07 PM   Specimen: Nasopharyngeal Swab; Nasopharyngeal(NP) swabs in vial transport medium  Result Value Ref Range Status   SARS Coronavirus 2 by RT PCR NEGATIVE NEGATIVE Final  Comment: (NOTE) SARS-CoV-2 target nucleic acids are NOT DETECTED.  The SARS-CoV-2 RNA is  generally detectable in upper respiratory specimens during the acute phase of infection. The lowest concentration of SARS-CoV-2 viral copies this assay can detect is 138 copies/mL. A negative result does not preclude SARS-Cov-2 infection and should not be used as the sole basis for treatment or other patient management decisions. A negative result may occur with  improper specimen collection/handling, submission of specimen other than nasopharyngeal swab, presence of viral mutation(s) within the areas targeted by this assay, and inadequate number of viral copies(<138 copies/mL). A negative result must be combined with clinical observations, patient history, and epidemiological information. The expected result is Negative.  Fact Sheet for Patients:  EntrepreneurPulse.com.au  Fact Sheet for Healthcare Providers:  IncredibleEmployment.be  This test is no t yet approved or cleared by the [place] FDA and  has been authorized for detection and/or diagnosis of SARS-CoV-2 by FDA under an Emergency Use Authorization (EUA). This EUA will remain  in effect (meaning this test can be used) for the duration of the COVID-19 declaration under Section 564(b)(1) of the Act, 21 U.S.C.section 360bbb-3(b)(1), unless the authorization is terminated  or revoked sooner.       Influenza A by PCR NEGATIVE NEGATIVE Final   Influenza B by PCR NEGATIVE NEGATIVE Final    Comment: (NOTE) The Xpert Xpress SARS-CoV-2/FLU/RSV plus assay is intended as an aid in the diagnosis of influenza from Nasopharyngeal swab specimens and should not be used as a sole basis for treatment. Nasal washings and aspirates are unacceptable for Xpert Xpress SARS-CoV-2/FLU/RSV testing.  Fact Sheet for Patients: EntrepreneurPulse.com.au  Fact Sheet for Healthcare Providers: IncredibleEmployment.be  This test is not yet approved or cleared by the [place] FDA and has been authorized for detection and/or diagnosis of SARS-CoV-2 by FDA under an Emergency Use Authorization (EUA). This EUA will remain in effect (meaning this test can be used) for the duration of the COVID-19 declaration under Section 564(b)(1) of the Act, 21 U.S.C. section 360bbb-3(b)(1), unless the authorization is terminated or revoked.  Performed at [facility], [place] [address] [place], [place] [address]   Urine Culture     Status: None   Collection Time: 05/22/21  8:58 PM   Specimen: In/Out Cath Urine  Result Value Ref Range Status   Specimen Description IN/OUT CATH URINE  Final   Special Requests NONE  Final   Culture   Final    NO GROWTH Performed at [facility], [place] [address], [place], [place] [address]    Report Status 05/23/2021 FINAL  Final  MRSA Next Gen by PCR, Nasal     Status: None   Collection Time: 05/22/21 11:54 PM   Specimen: Nasal Mucosa; Nasal Swab  Result Value Ref Range Status   MRSA by PCR Next Gen NOT DETECTED NOT DETECTED Final    Comment: (NOTE) The GeneXpert MRSA Assay (FDA approved for NASAL specimens only), is one component of a comprehensive MRSA colonization surveillance program. It is not intended to diagnose MRSA infection nor to guide or monitor treatment for MRSA infections. Test performance is not FDA approved in patients less than 35 years old. Performed at [facility], [place] [address], [place], [place] [address]      Time coordinating discharge: 38 minutes.   SIGNED:   Hosie Poisson, MD  [facility] 05/25/2021, 9:33 AM

## 2021-05-25 NOTE — Progress Notes (Signed)
DISCHARGE NOTE HOME Marcia [name] to be discharged Home per MD order. Discussed prescriptions and follow up appointments with the patient. Prescriptions given to patient; medication list explained in detail. Patient verbalized understanding.  Skin clean, dry and intact without evidence of skin break down, no evidence of skin tears noted. IV catheter discontinued intact. Site without signs and symptoms of complications. Dressing and pressure applied. Pt denies pain at the site currently. No complaints noted.  Patient free of lines, drains, and wounds.   An After Visit Summary (AVS) was printed and given to the patient. Patient escorted via wheelchair, and discharged home via private auto.  Vira Agar, RN

## 2021-05-25 NOTE — Plan of Care (Signed)
  Problem: Education: Goal: Knowledge of General Education information will improve Description: Including pain rating scale, medication(s)/side effects and non-pharmacologic comfort measures Outcome: Completed/Met   Problem: Health Behavior/Discharge Planning: Goal: Ability to manage health-related needs will improve Outcome: Completed/Met   Problem: Clinical Measurements: Goal: Ability to maintain clinical measurements within normal limits will improve Outcome: Completed/Met Goal: Will remain free from infection Outcome: Completed/Met Goal: Diagnostic test results will improve Outcome: Completed/Met Goal: Respiratory complications will improve Outcome: Completed/Met Goal: Cardiovascular complication will be avoided Outcome: Completed/Met   Problem: Activity: Goal: Risk for activity intolerance will decrease Outcome: Completed/Met   Problem: Coping: Goal: Level of anxiety will decrease Outcome: Completed/Met   Problem: Elimination: Goal: Will not experience complications related to bowel motility Outcome: Completed/Met Goal: Will not experience complications related to urinary retention Outcome: Completed/Met   Problem: Pain Managment: Goal: General experience of comfort will improve Outcome: Completed/Met   Problem: Safety: Goal: Ability to remain free from injury will improve Outcome: Completed/Met   Problem: Skin Integrity: Goal: Risk for impaired skin integrity will decrease Outcome: Completed/Met   Problem: Cardiac: Goal: Ability to maintain an adequate cardiac output will improve Outcome: Completed/Met   Problem: Health Behavior/Discharge Planning: Goal: Ability to identify and utilize available resources and services will improve Outcome: Completed/Met Goal: Ability to manage health-related needs will improve Outcome: Completed/Met   Problem: Fluid Volume: Goal: Ability to achieve a balanced intake and output will improve Outcome: Completed/Met    Problem: Metabolic: Goal: Ability to maintain appropriate glucose levels will improve Outcome: Completed/Met   Problem: Nutritional: Goal: Maintenance of adequate nutrition will improve Outcome: Completed/Met Goal: Maintenance of adequate weight for body size and type will improve Outcome: Completed/Met   Problem: Respiratory: Goal: Will regain and/or maintain adequate ventilation Outcome: Completed/Met   Problem: Urinary Elimination: Goal: Ability to achieve and maintain adequate renal perfusion and functioning will improve Outcome: Completed/Met

## 2021-07-15 ENCOUNTER — Ambulatory Visit: Payer: 59 | Admitting: Podiatry

## 2021-07-22 ENCOUNTER — Ambulatory Visit: Payer: 59 | Admitting: Podiatry

## 2021-08-29 DIAGNOSIS — E785 Hyperlipidemia, unspecified: Secondary | ICD-10-CM | POA: Diagnosis not present

## 2021-08-29 DIAGNOSIS — E1165 Type 2 diabetes mellitus with hyperglycemia: Secondary | ICD-10-CM | POA: Diagnosis not present

## 2021-08-29 DIAGNOSIS — E039 Hypothyroidism, unspecified: Secondary | ICD-10-CM | POA: Diagnosis not present

## 2021-08-31 ENCOUNTER — Encounter: Payer: Self-pay | Admitting: Obstetrics and Gynecology

## 2021-08-31 ENCOUNTER — Ambulatory Visit (INDEPENDENT_AMBULATORY_CARE_PROVIDER_SITE_OTHER): Payer: Medicaid Other | Admitting: Obstetrics and Gynecology

## 2021-08-31 ENCOUNTER — Other Ambulatory Visit: Payer: Self-pay

## 2021-08-31 VITALS — BP 118/67 | HR 75 | Resp 16 | Ht 62.0 in | Wt 124.9 lb

## 2021-08-31 DIAGNOSIS — E039 Hypothyroidism, unspecified: Secondary | ICD-10-CM | POA: Diagnosis not present

## 2021-08-31 DIAGNOSIS — R599 Enlarged lymph nodes, unspecified: Secondary | ICD-10-CM | POA: Diagnosis not present

## 2021-08-31 DIAGNOSIS — R5383 Other fatigue: Secondary | ICD-10-CM | POA: Diagnosis not present

## 2021-08-31 DIAGNOSIS — L900 Lichen sclerosus et atrophicus: Secondary | ICD-10-CM | POA: Diagnosis not present

## 2021-08-31 DIAGNOSIS — E1165 Type 2 diabetes mellitus with hyperglycemia: Secondary | ICD-10-CM | POA: Diagnosis not present

## 2021-08-31 DIAGNOSIS — E785 Hyperlipidemia, unspecified: Secondary | ICD-10-CM | POA: Diagnosis not present

## 2021-08-31 DIAGNOSIS — C439 Malignant melanoma of skin, unspecified: Secondary | ICD-10-CM | POA: Diagnosis not present
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
## 2021-09-01 ENCOUNTER — Telehealth: Payer: Self-pay

## 2021-09-01 NOTE — Telephone Encounter (Signed)
Contacted by [facility] that they received referral for Marcia [name]. She has received all of her care from Dr. Fransisca Connors here at [facility]. Called and left [name] a voicemail to return call and we can arrange an appointment.

## 2021-09-02 ENCOUNTER — Encounter: Payer: Self-pay | Admitting: Obstetrics and Gynecology

## 2021-09-06 MED ORDER — CLOBETASOL PROPIONATE 0.05 % EX OINT: 1.0000 "application " | TOPICAL_OINTMENT | Freq: Two times a day (BID) | CUTANEOUS | 0 refills | Status: AC

## 2021-09-07 ENCOUNTER — Telehealth: Payer: Self-pay | Admitting: Pharmacist

## 2021-09-07 ENCOUNTER — Other Ambulatory Visit: Payer: Self-pay

## 2021-09-07 ENCOUNTER — Encounter: Payer: Self-pay | Admitting: Obstetrics and Gynecology

## 2021-09-07 ENCOUNTER — Inpatient Hospital Stay: Payer: 59 | Attending: Obstetrics and Gynecology | Admitting: Obstetrics and Gynecology

## 2021-09-07 VITALS — BP 91/64 | HR 72 | Temp 98.7°F | Resp 20 | Wt 125.5 lb

## 2021-09-07 DIAGNOSIS — Z87891 Personal history of nicotine dependence: Secondary | ICD-10-CM | POA: Diagnosis not present

## 2021-09-07 DIAGNOSIS — D071 Carcinoma in situ of vulva: Secondary | ICD-10-CM | POA: Insufficient documentation

## 2021-09-07 DIAGNOSIS — Z90710 Acquired absence of both cervix and uterus: Secondary | ICD-10-CM | POA: Insufficient documentation

## 2021-09-07 DIAGNOSIS — Z803 Family history of malignant neoplasm of breast: Secondary | ICD-10-CM | POA: Insufficient documentation

## 2021-09-07 DIAGNOSIS — L292 Pruritus vulvae: Secondary | ICD-10-CM

## 2021-09-07 DIAGNOSIS — E1010 Type 1 diabetes mellitus with ketoacidosis without coma: Secondary | ICD-10-CM | POA: Insufficient documentation

## 2021-09-07 NOTE — Progress Notes (Signed)
Gynecologic Oncology Interval Note  Referring Provider: Dr. 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 female, diagnosed with VIN III, s/p laser in 2016 who returns to clinic for new complaint of vulvar irritation and itching. She was seen by Dr. Amalia Hailey who noted atrophic areas on upper labia and clitoral hood. She was started on clobetasol. She presents for evaluation and possible biopsy today.   No improvement in symptoms with clobetasol. Says her symptoms are not typical of previous disease. She is s/p total hysterectomy, bilateral salpingo-oophorectomy for benign disease 4 years ago. Not on hormonal therapy.   Treatment History:  Prior hysterectomy   08/2009     VIN II                 laser vaporization 03/2010     VIN II                 [place] started,well tolerated with reduced frequency, 04/2011     infection peri-clitoral area, responding to antibiotic therapy 06/2012   recurrent VIN III on biopsy of left labia minora, PAP negative. 1/14     laser vaporization of raised epithelium consistent with VIN, mainly on the anterior portion of the labia minora. A few other scattered areas around the external genitalia.  04/07/15:  Had laser ablation in OR with Dr Theora Gianotti.  "Acetowhite epithelium involving bilateral labia minora of the vulva each approximately 3 cm. On the left the abnormalities extending to the medial aspect of the labia minora. There was a another patch of acetowhite on right vulva at 3 o'clock as well as another area at the perineum. There were scattered small 1-2 mm areas of acetowhite extending toward the anus."  02/14/17   Vulvar biopsies, right VIN2 grossly removed, left keratosis. DIAGNOSIS: VULVA, RIGHT ANTERIOR; BIOPSY:  - KERATOSIS AND MILD SQUAMOUS DYSPLASIA.  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  - A PERIPHERAL EDGE IS INVOLVED.   Seen in clinic on 04/11/17.  At that time,  she had a new 69m wart near introitus at 7:00 which was removed.  Wet prep positive for yeast.  Negative for trichomoniasis and clue cells.    Pathology  04/11/2017:  DIAGNOSIS:  A. VULVA, RIGHT; BIOPSY/REMOVAL:  - LOW-GRADE SQUAMOUS INTRAEPITHELIAL LESION (CONDYLOMA).  - CANDIDIASIS.  - NEGATIVE FOR HIGH-GRADE SQUAMOUS INTRAEPITHELIAL LESION AND MALIGNANCY.  Comment: There are focal intraepithelial neutrophils, and PAS stain for fungi demonstrates a few intraepithelial pseudohyphae.   She was treated with Diflucan 150 mg daily X 3 days and used clobetasol topically for persistent symptoms.   She had her left ovary removed 05/2017 with Dr. HKenton Kingfisherd/t cyst and pain.   4/19 Pathology:  A.  RIGHT VULVA; BIOPSY:  - HIGH-GRADE SQUAMOUS INTRAEPITHELIAL LESION (VIN III).  - A PERIPHERAL BIOPSY EDGE IS INVOLVED.  8/19 - normal exam and asymptomatic.   4/20 - telemedicine visit with no symptoms.  7/20 visit had colposcopy with small 3 mm vulvar lesion involving the right labia minora. Biopsy showed only hyperparakeratosis. Treated with clobetasol and symptoms resolved.  Stopped smoking.  GYN History:   Gravida 3    Para 2    Age at Menarche 89   Regular Pap Smears Yes    History of HPV Infection    Cycle Normal       Additional Hx no other gyn problems in the past   Problem List: Patient Active  Problem List   Diagnosis Date Noted   DKA (diabetic ketoacidosis) ([place]) 89/37/3428   Metabolic acidosis    Renal insufficiency    Encephalopathy    Vulvar dermatitis 04/11/2019   Right shoulder pain 09/03/2018   Radiculopathy 04/18/2018   Left lower quadrant pain 05/22/2017   Ovarian cyst, complex 05/11/2017   Leukocytosis 05/19/2016   Erythrocytosis 05/19/2016   Neck pain on left side 05/19/2016   Vulvar intraepithelial neoplasia (VIN) grade 3 03/10/2015   Allergy to antibacterial drug 10/20/2014   Staphylococcal infection of skin 10/13/2014   Furuncle of vulva 10/13/2014    Hyperlipidemia due to type 1 diabetes mellitus ([place]) 09/12/2014   Encounter for preventive health examination 09/12/2014   Onychomycosis of toenail 09/10/2014   Type 1 diabetes mellitus with neurological manifestations, uncontrolled 02/10/2014   Chronic cough 02/05/2014   Carpal tunnel syndrome 12/17/2013   Chronic bronchitis ([place]) 11/27/2013   Unspecified constipation 11/11/2013   Left wrist pain 11/07/2013   Insomnia secondary to anxiety 07/09/2013   History of hyperthyroidism 07/01/2013   Degenerative TFCC tear 05/23/2013   Hyperthyroidism 04/28/2013   Right wrist pain 04/28/2013   Type I diabetes mellitus ([place]) 04/28/2013   Snoring disorder 12/26/2012   Tobacco abuse 05/22/2011   Tobacco abuse counseling 05/22/2011   ABNORMAL EKG 09/10/2009   Dyslipidemia 04/02/2006    Past Medical History: Past Medical History:  Diagnosis Date   Diabetes mellitus    Hay fever    Allergies   Headache    only from neck pain   Heart murmur    at birth    History of HPV infection    Hypercholesterolemia    Hypothyroidism    Melanoma of skin ([place]) 2010   In her vaginal area with surgical resection.    Myocardial infarction [facility]) 1997   At age 34   Ovarian cyst 05/09/2017   Radiculopathy of cervical region    B/L   Thyroid disease    Tobacco abuse    VIN III (vulvar intraepithelial neoplasia III)    Vulvar lesion    Wears glasses     Past Surgical History: Past Surgical History:  Procedure Laterality Date   ABDOMINAL HYSTERECTOMY  10/12/12   ANKLE SURGERY Right 2010   Fractued ankle   ANTERIOR CERVICAL DECOMPRESSION/DISCECTOMY FUSION 4 LEVELS N/A 04/18/2018   Procedure: ANTERIOR CERVICAL DECOMPRESSION FUSION. CERVICAL 4-5, CERVICAL 5-6, CERVICAL 6-7 WITH INSTRUMENTATION AND ALLOGRAFT.;  Surgeon: Phylliss Bob, MD;  Location: [place];  Service: Orthopedics;  Laterality: N/A;   BREAST BIOPSY Bilateral @ 2013   core with clips   FLEXIBLE BRONCHOSCOPY N/A 05/30/2017   Procedure:  FLEXIBLE BRONCHOSCOPY;  Surgeon: Laverle Hobby, MD;  Location: [facility];  Service: Pulmonary;  Laterality: N/A;   LAPAROSCOPIC SALPINGO OOPHERECTOMY Left 06/14/2017   Procedure: LAPAROSCOPIC SALPINGO OOPHORECTOMY;  Surgeon: Gae Dry, MD;  Location: [facility];  Service: Gynecology;  Laterality: Left;   SHOULDER SURGERY Right 09/2018   TUBAL LIGATION  2004   TUBAL LIGATION  2004, [facility]   VULVAR LESION REMOVAL  10/2009 & 05/2012   VULVAR LESION REMOVAL N/A 04/07/2015   Procedure: VULVAR LESION;  Surgeon: Gillis Ends, MD;  Location: [facility];  Service: Gynecology;  Laterality: N/A;  with J-Plasma blade   Family History: Family History  Problem Relation Age of Onset   Diabetes Father    Hypertension Mother    Hyperlipidemia Mother    Diabetes Mother    Aneurysm Brother    Breast cancer  Maternal Aunt 40       lung and ovarian   Social History: Social History   Socioeconomic History   Marital status: Married    Spouse name: Not on file   Number of children: Not on file   Years of education: Not on file   Highest education level: Not on file  Occupational History   Not on file  Tobacco Use   Smoking status: Former    Packs/day: 0.50    Years: 22.00    Pack years: 11.00    Types: Cigarettes    Quit date: 11/06/2019    Years since quitting: 1.8   Smokeless tobacco: Never  Vaping Use   Vaping Use: Never used  Substance and Sexual Activity   Alcohol use: Yes    Alcohol/week: 0.0 standard drinks    Comment: occasional beer   Drug use: No   Sexual activity: Yes    Birth control/protection: Surgical  Other Topics Concern   Not on file  Social History Narrative   No regular exercise.   Lives with spouse.   Social Determinants of Health   Financial Resource Strain: Not on file  Food Insecurity: Not on file  Transportation Needs: Not on file  Physical Activity: Not on file  Stress: Not on file  Social Connections: Not on file  Intimate Partner  Violence: Not on file    Allergies: Allergies  Allergen Reactions   Januvia [Sitagliptin] Nausea And Vomiting   Sulfa Antibiotics    Chantix [Varenicline] Rash   Septra [Sulfamethoxazole-Trimethoprim] Rash    Current Medications: Current Outpatient Medications  Medication Sig Dispense Refill   acarbose (PRECOSE) 25 MG tablet Take 25 mg by mouth 3 (three) times daily with meals.     ACCU-CHEK AVIVA PLUS test strip TEST 2 TIMES A DAY 100 each 6   albuterol (VENTOLIN HFA) 108 (90 Base) MCG/ACT inhaler Inhale 1-2 puffs into the lungs every 6 (six) hours as needed for wheezing or shortness of breath.     aspirin EC 81 MG tablet Take 81 mg by mouth daily.     B-D ULTRAFINE III SHORT PEN 31G X 8 MM MISC USE TWO NEEDLES DAILY 100 each 8   Blood Glucose Monitoring Suppl (ACCU-CHEK AVIVA PLUS) W/DEVICE KIT 1 Device by Does not apply route once. 1 kit 0   dapagliflozin propanediol (FARXIGA) 10 MG TABS tablet Take 10 mg by mouth daily.     ergocalciferol (VITAMIN D2) 50000 units capsule Take 50,000 Units by mouth every Saturday.      insulin aspart (NOVOLOG FLEXPEN) 100 UNIT/ML FlexPen 4 units with breakfast, and 4 units with the evening meal. (Patient taking differently: Inject 2-12 Units into the skin 3 (three) times daily with meals. Per sliding scale) 15 mL 11   insulin degludec (TRESIBA) 100 UNIT/ML FlexTouch Pen Inject 48 Units into the skin at bedtime.     insulin degludec (TRESIBA) 200 UNIT/ML FlexTouch Pen Inject 46 Units into the skin at bedtime.     levothyroxine (SYNTHROID) 50 MCG tablet Take 50 mcg by mouth daily before breakfast.     levothyroxine (SYNTHROID, LEVOTHROID) 75 MCG tablet Take 75 mcg by mouth daily before breakfast.     Multiple Vitamins-Calcium (ONE-A-DAY WOMENS FORMULA PO) Take 1 tablet by mouth daily.     PARoxetine Mesylate 7.5 MG CAPS Take 7.5 mg by mouth daily before breakfast.      rosuvastatin (CRESTOR) 20 MG tablet Take 20 mg by mouth at bedtime.  traZODone  (DESYREL) 50 MG tablet Take 50-100 mg by mouth at bedtime as needed for sleep.     atorvastatin (LIPITOR) 40 MG tablet Take 40 mg by mouth at bedtime.  (Patient not taking: Reported on 09/07/2021)     canagliflozin (INVOKANA) 300 MG TABS tablet Take 300 mg by mouth daily before breakfast.     citalopram (CELEXA) 20 MG tablet Take 20 mg by mouth at bedtime. (Patient not taking: Reported on 09/07/2021)     clobetasol ointment (TEMOVATE) 7.61 % Apply 1 application topically 2 (two) times daily. (Patient not taking: Reported on 09/07/2021) 60 g 0   No current facility-administered medications for this visit.   Facility-Administered Medications Ordered in Other Visits  Medication Dose Route Frequency Provider Last Rate Last Admin   lidocaine (XYLOCAINE) 2 % jelly 1 application  1 application Topical QID PRN Gillis Ends, MD       Review of Systems General:  no complaints Skin: no complaints Eyes: no complaints HEENT: no complaints Breasts: no complaints Pulmonary: no complaints Cardiac: no complaints Gastrointestinal: no complaints Genitourinary/Sexual: no complaints Ob/Gyn: per hpi Musculoskeletal: no complaints Hematology: no complaints Neurologic/Psych: no complaints  Objective:  Physical Examination:  Vitals:   09/07/21 1532  BP: 91/64  Pulse: 72  Resp: 20  Temp: 98.7 F (37.1 C)  SpO2: 100%  ECOG Performance Status: 0 asymptomatic  GENERAL: Patient is a well appearing female in no acute distress EXTREMITIES:  No peripheral edema.  SKIN:  Clear with no obvious rashes or skin changes.  NEURO:  Nonfocal. Well oriented.  Appropriate affect.  Pelvic: chaperoned by NP; Vulva: dry and atrophic. Irritated appearing particularly at clitoral hood. No new lesions. Adnexa: negative for masses or nodularity; Uterus, Cervix, ovaries: surgically absent; Rectal: deferred  Colposcopy: After written informed consent and time out vulva soaked with acetic acid and colposcopy done.  No  discrete lesions, although her vulva has some discoloration due to various treatments over the years for VIN. Vulva is atrophic.   Assessment:  Samanthia Howland is a 51 y.o. female with a history of vulvar dysplasia VINIII s/p laser 04/07/15. Vulvar lesion biopsied 7/18 LGSIL.  Vulvar lesion biopsied off 4/19 VINIII and positive margin but no evidence of VIN on exam 8/19. Had some itching in 7/20 and biopsy negative.  She used clobetasol with resolution of symptoms.  She has new symptoms of itching and irritation today but no obvious lesion on colposcopy today. Clinically, atrophic appearance and had surgical menopause about 4 years ago.   Prior hysterectomy 2014. PAP/HPV negative 4/21.  Plan:   Problem List Items Addressed This Visit       Genitourinary   Vulvar intraepithelial neoplasia (VIN) grade 3 - Primary   Other Visit Diagnoses     Vulvar itching          Trial of premarin cream applied topically at night for 8 weeks then we will see her back to repeat colposcopy and possible biopsy at that time.   I discussed the assessment and treatment plan with the patient. The patient was provided an opportunity to ask questions and all were answered. The patient agreed with the plan and demonstrated an understanding of the instructions.  Verlon Au, NP  I personally interviewed and examined the patient. Agreed with the above/below plan of care. I have directly contributed to assessment and plan of care of this patient and educated and discussed with patient and family.  Mellody Drown, MD  CC:  Danelle Berry, [place] [address] [place],  [place] [address] [phone]

## 2021-09-07 NOTE — Telephone Encounter (Signed)
Oral Chemotherapy Pharmacist Encounter  Dispensed samples to patient (via Beckey Rutter, NP):  Medication: Premarin vaginal cream Quantity dispensed: 2 boxes (each box 4g of cream) Manufacturer: [employer] Lot: 1 box- [id number], 1 box- [id number] Exp: [id number] (03/2022), 8R6X (06/2022)  Darl Pikes, PharmD, BCPS, BCOP, CPP Hematology/Oncology Clinical Pharmacist Practitioner [place] [phone]  09/07/2021 4:16 PM

## 2021-09-29 ENCOUNTER — Telehealth: Payer: Medicaid Other | Admitting: Obstetrics and Gynecology

## 2021-10-04 ENCOUNTER — Telehealth: Payer: Medicaid Other | Admitting: Obstetrics and Gynecology

## 2021-10-11 ENCOUNTER — Telehealth (INDEPENDENT_AMBULATORY_CARE_PROVIDER_SITE_OTHER): Payer: Medicaid Other | Admitting: Obstetrics and Gynecology

## 2021-10-11 ENCOUNTER — Encounter: Payer: Self-pay | Admitting: Obstetrics and Gynecology

## 2021-10-11 DIAGNOSIS — D071 Carcinoma in situ of vulva: Secondary | ICD-10-CM

## 2021-10-11 DIAGNOSIS — L292 Pruritus vulvae: Secondary | ICD-10-CM

## 2021-10-11 DIAGNOSIS — L900 Lichen sclerosus et atrophicus: Secondary | ICD-10-CM

## 2021-10-11 NOTE — Progress Notes (Signed)
Virtual Visit via Video Note  I connected with Marcia [name] on 10/11/21 at  7:45 AM EST by video and verified that I was speaking with the correct person using two identifiers.    Marcia [name] is a 51 y.o. [id number] who LMP was Patient's last menstrual period was 10/12/2012. I discussed the limitations, risks, security and privacy concerns of performing an evaluation and management service by video and the availability of in person appointments. I also discussed with the patient that there may be a patient responsible charge related to this service. The patient expressed understanding and agreed to proceed.  Location of patient:  [place]  Patient gave explicit verbal consent for video visit:  YES  Location of provider:  [facility] office  Persons other than physician and patient involved in provider conference:  None   Subjective:   History of Present Illness:    She presented to my office with a 62-monthhistory of worsening vulvar burning and itching which had begun in November.  She had a history of VIN 3 with excision.  At her last examination she was noted to have atrophic areas of the vulva/labia.  I believed her symptoms and physical examination was consistent with lichen sclerosis but because of her previous diagnosis I referred her to Dr. BFransisca Connorswho had seen her for original diagnosis of VIN 3. She underwent examination and colposcopy. No evidence of recurrent VIN 3 was noted by Dr. BFransisca Connorsand he began her on twice weekly estrogen cream. Today she reports that her symptoms are significantly improved and she is happy with the results.  She has no other issues at this time.  Hx: The following portions of the patient's history were reviewed and updated as appropriate:             She  has a past medical history of Diabetes mellitus, Hay fever, Headache, Heart murmur, History of HPV infection, Hypercholesterolemia, Hypothyroidism, [place] Ovarian cyst (05/09/2017), Radiculopathy of cervical region, Thyroid disease, Tobacco abuse, VIN III (vulvar intraepithelial neoplasia III), Vulvar lesion, and Wears glasses. She does not have any pertinent problems on file. She  has a past surgical history that includes Ankle surgery (Right, 2010); Tubal ligation (2004); Tubal ligation (2004, [facility]); Abdominal hysterectomy (10/12/12); Vulvar lesion removal (10/2009 & 05/2012); Vulvar lesion removal (N/A, 04/07/2015); Flexible bronchoscopy (N/A, 05/30/2017); Laparoscopic salpingo oophorectomy (Left, 06/14/2017); Anterior cervical decompression/discectomy fusion 4 level (N/A, 04/18/2018); Shoulder surgery (Right, 09/2018); and Breast biopsy (Bilateral, @ 2013). Her family history includes Aneurysm in her brother; Breast cancer (age of onset: 425 in her maternal aunt; Diabetes in her father and mother; Hyperlipidemia in her mother; Hypertension in her mother. She  reports that she quit smoking about 23 months ago. Her smoking use included cigarettes. She has a 11.00 pack-year smoking history. She has never used smokeless tobacco. She reports current alcohol use. She reports that she does not use drugs. She has a current medication list which includes the following prescription(s): acarbose, accu-chek aviva plus, albuterol, aspirin ec, atorvastatin, b-d ultrafine iii short pen, accu-chek aviva plus, canagliflozin, citalopram, clobetasol ointment, dapagliflozin propanediol, ergocalciferol, insulin aspart, insulin degludec, insulin degludec, levothyroxine, levothyroxine, multiple vitamins-calcium, paroxetine mesylate, rosuvastatin, and trazodone, and the following Facility-Administered Medications: lidocaine. She is allergic to jTonga[sitagliptin], sulfa antibiotics, chantix [varenicline], and septra [sulfamethoxazole-trimethoprim].       Review of Systems:  Review of Systems  Constitutional: Denied constitutional symptoms, night sweats,  recent illness,  fatigue, fever, insomnia and weight loss.  Eyes: Denied eye symptoms, eye pain, photophobia, vision change and visual disturbance.  Ears/Nose/Throat/Neck: Denied ear, nose, throat or neck symptoms, hearing loss, nasal discharge, sinus congestion and sore throat.  Cardiovascular: Denied cardiovascular symptoms, arrhythmia, chest pain/pressure, edema, exercise intolerance, orthopnea and palpitations.  Respiratory: Denied pulmonary symptoms, asthma, pleuritic pain, productive sputum, cough, dyspnea and wheezing.  Gastrointestinal: Denied, gastro-esophageal reflux, melena, nausea and vomiting.  Genitourinary: Denied genitourinary symptoms including symptomatic vaginal discharge, pelvic relaxation issues, and urinary complaints.  Musculoskeletal: Denied musculoskeletal symptoms, stiffness, swelling, muscle weakness and myalgia.  Dermatologic: Denied dermatology symptoms, rash and scar.  Neurologic: Denied neurology symptoms, dizziness, headache, neck pain and syncope.  Psychiatric: Denied psychiatric symptoms, anxiety and depression.  Endocrine: Denied endocrine symptoms including hot flashes and night sweats.   Meds:   Current Outpatient Medications on File Prior to Visit  Medication Sig Dispense Refill   acarbose (PRECOSE) 25 MG tablet Take 25 mg by mouth 3 (three) times daily with meals.     ACCU-CHEK AVIVA PLUS test strip TEST 2 TIMES A DAY 100 each 6   albuterol (VENTOLIN HFA) 108 (90 Base) MCG/ACT inhaler Inhale 1-2 puffs into the lungs every 6 (six) hours as needed for wheezing or shortness of breath.     aspirin EC 81 MG tablet Take 81 mg by mouth daily.     atorvastatin (LIPITOR) 40 MG tablet Take 40 mg by mouth at bedtime.  (Patient not taking: Reported on 09/07/2021)     B-D ULTRAFINE III SHORT PEN 31G X 8 MM MISC USE TWO NEEDLES DAILY 100 each 8   Blood Glucose Monitoring Suppl (ACCU-CHEK AVIVA PLUS) W/DEVICE KIT 1 Device by Does not apply route once. 1 kit 0    canagliflozin (INVOKANA) 300 MG TABS tablet Take 300 mg by mouth daily before breakfast.     citalopram (CELEXA) 20 MG tablet Take 20 mg by mouth at bedtime. (Patient not taking: Reported on 09/07/2021)     clobetasol ointment (TEMOVATE) 6.73 % Apply 1 application topically 2 (two) times daily. (Patient not taking: Reported on 09/07/2021) 60 g 0   dapagliflozin propanediol (FARXIGA) 10 MG TABS tablet Take 10 mg by mouth daily.     ergocalciferol (VITAMIN D2) 50000 units capsule Take 50,000 Units by mouth every Saturday.      insulin aspart (NOVOLOG FLEXPEN) 100 UNIT/ML FlexPen 4 units with breakfast, and 4 units with the evening meal. (Patient taking differently: Inject 2-12 Units into the skin 3 (three) times daily with meals. Per sliding scale) 15 mL 11   insulin degludec (TRESIBA) 100 UNIT/ML FlexTouch Pen Inject 48 Units into the skin at bedtime.     insulin degludec (TRESIBA) 200 UNIT/ML FlexTouch Pen Inject 46 Units into the skin at bedtime.     levothyroxine (SYNTHROID) 50 MCG tablet Take 50 mcg by mouth daily before breakfast.     levothyroxine (SYNTHROID, LEVOTHROID) 75 MCG tablet Take 75 mcg by mouth daily before breakfast.     Multiple Vitamins-Calcium (ONE-A-DAY WOMENS FORMULA PO) Take 1 tablet by mouth daily.     PARoxetine Mesylate 7.5 MG CAPS Take 7.5 mg by mouth daily before breakfast.      rosuvastatin (CRESTOR) 20 MG tablet Take 20 mg by mouth at bedtime.     traZODone (DESYREL) 50 MG tablet Take 50-100 mg by mouth at bedtime as needed for sleep.     Current Facility-Administered Medications on File Prior to Visit  Medication Dose Route Frequency Provider  Last Rate Last Admin   lidocaine (XYLOCAINE) 2 % jelly 1 application  1 application. Topical QID PRN Gillis Ends, MD        Assessment:    [id number] Patient Active Problem List   Diagnosis Date Noted   DKA (diabetic ketoacidosis) ([place]) 54/27/0623   Metabolic acidosis    Renal insufficiency    Encephalopathy     Vulvar dermatitis 04/11/2019   Right shoulder pain 09/03/2018   Radiculopathy 04/18/2018   Left lower quadrant pain 05/22/2017   Ovarian cyst, complex 05/11/2017   Leukocytosis 05/19/2016   Erythrocytosis 05/19/2016   Neck pain on left side 05/19/2016   Vulvar intraepithelial neoplasia (VIN) grade 3 03/10/2015   Allergy to antibacterial drug 10/20/2014   Staphylococcal infection of skin 10/13/2014   Furuncle of vulva 10/13/2014   Hyperlipidemia due to type 1 diabetes mellitus ([place]) 09/12/2014   Encounter for preventive health examination 09/12/2014   Onychomycosis of toenail 09/10/2014   Type 1 diabetes mellitus with neurological manifestations, uncontrolled 02/10/2014   Chronic cough 02/05/2014   Carpal tunnel syndrome 12/17/2013   Chronic bronchitis ([place]) 11/27/2013   Unspecified constipation 11/11/2013   Left wrist pain 11/07/2013   Insomnia secondary to anxiety 07/09/2013   History of hyperthyroidism 07/01/2013   Degenerative TFCC tear 05/23/2013   Hyperthyroidism 04/28/2013   Right wrist pain 04/28/2013   Type I diabetes mellitus ([place]) 04/28/2013   Snoring disorder 12/26/2012   Tobacco abuse 05/22/2011   Tobacco abuse counseling 05/22/2011   ABNORMAL EKG 09/10/2009   Dyslipidemia 04/02/2006     1. Vulvar itching   2. Lichen sclerosus   3. VIN III (vulvar intraepithelial neoplasia III)     Patient has a remote history of VIN 3.  Her itching is currently resolved using topical estrogen cream.  Plan:            1.  Continued use of estrogen cream Orders No orders of the defined types were placed in this encounter.   No orders of the defined types were placed in this encounter.     F/U  Return for Annual Physical. I spent 14 minutes involved in the care of this patient preparing to see the patient by obtaining and reviewing her medical history (including labs, imaging tests and prior procedures), documenting clinical information in the electronic health record  (EHR), counseling and coordinating care plans, writing and sending prescriptions, ordering tests or procedures and in direct communicating with the patient and medical staff discussing pertinent items from her history and physical exam.   Finis Bud, M.D. 10/11/2021 8:03 AM

## 2021-10-19 DIAGNOSIS — E1165 Type 2 diabetes mellitus with hyperglycemia: Secondary | ICD-10-CM | POA: Diagnosis not present

## 2021-10-19 DIAGNOSIS — E039 Hypothyroidism, unspecified: Secondary | ICD-10-CM | POA: Diagnosis not present

## 2021-10-19 DIAGNOSIS — E782 Mixed hyperlipidemia: Secondary | ICD-10-CM | POA: Diagnosis not present

## 2021-10-19 DIAGNOSIS — J449 Chronic obstructive pulmonary disease, unspecified: Secondary | ICD-10-CM | POA: Diagnosis not present

## 2021-11-03 DIAGNOSIS — Z20822 Contact with and (suspected) exposure to covid-19: Secondary | ICD-10-CM | POA: Diagnosis not present

## 2021-11-09 ENCOUNTER — Inpatient Hospital Stay: Payer: 59 | Attending: Obstetrics and Gynecology | Admitting: Obstetrics and Gynecology

## 2021-11-09 VITALS — BP 108/69 | HR 79 | Temp 98.0°F | Resp 18 | Wt 127.7 lb

## 2021-11-09 DIAGNOSIS — N9089 Other specified noninflammatory disorders of vulva and perineum: Secondary | ICD-10-CM

## 2021-11-09 DIAGNOSIS — Z79890 Hormone replacement therapy (postmenopausal): Secondary | ICD-10-CM | POA: Insufficient documentation

## 2021-11-09 DIAGNOSIS — Z90710 Acquired absence of both cervix and uterus: Secondary | ICD-10-CM | POA: Diagnosis not present

## 2021-11-09 DIAGNOSIS — Z87891 Personal history of nicotine dependence: Secondary | ICD-10-CM | POA: Diagnosis not present

## 2021-11-09 DIAGNOSIS — Z90722 Acquired absence of ovaries, bilateral: Secondary | ICD-10-CM | POA: Diagnosis not present

## 2021-11-09 DIAGNOSIS — D071 Carcinoma in situ of vulva: Secondary | ICD-10-CM | POA: Diagnosis not present

## 2021-11-09 NOTE — Progress Notes (Signed)
Gynecologic Oncology Interval Note ? ?Referring Provider: Dr. Kenton Kingfisher ? 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 ?Subjective:  ?Marcia [name] is a 51 y.o. female, diagnosed with VIN III, s/p laser in 2016 who returns to clinic for new complaint of vulvar irritation and itching. She was seen by Dr. Amalia Hailey who noted atrophic areas on upper labia and clitoral hood. She was started on clobetasol, no improvement. She was started on trial premarin cream topically x 8 weeks with improvement and presents for colposcopy and possible biopsy in view of irritation.   ? ?She is s/p total hysterectomy, bilateral salpingo-oophorectomy for benign disease 4 years ago. Not on hormonal therapy.  ? ?Treatment History:  ?Prior hysterectomy   ?08/2009     VIN II ?                laser vaporization ?03/2010     VIN II ?                Aldara started,well tolerated with reduced frequency, ?04/2011     infection peri-clitoral area, responding to antibiotic therapy ?06/2012   recurrent VIN III on biopsy of left labia minora, PAP negative. ?1/14     laser vaporization of raised epithelium consistent with VIN, mainly on the anterior portion of the labia minora. A few other scattered areas around the external genitalia. ? ?04/07/15:  Had laser ablation in OR with Dr Theora Gianotti.  "Acetowhite epithelium involving bilateral labia minora of the vulva each approximately 3 cm. On the left the abnormalities extending to the medial aspect of the labia minora. There was a another patch of acetowhite on right vulva at 3 o'clock as well as another area at the perineum. There were scattered small 1-2 mm areas of acetowhite extending toward the anus." ? ?02/14/17   Vulvar biopsies, right VIN2 grossly removed, left keratosis. ?DIAGNOSIS: VULVA, RIGHT ANTERIOR; BIOPSY:  ?- KERATOSIS AND MILD SQUAMOUS DYSPLASIA. ? 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  ?- A PERIPHERAL EDGE IS INVOLVED.  ? ?Seen in clinic on 04/11/17.  At  that time, she had a new 80m wart near introitus at 7:00 which was removed.  Wet prep positive for yeast.  Negative for trichomoniasis and clue cells.   ? ?Pathology  04/11/2017:  ?DIAGNOSIS:  ?A. VULVA, RIGHT; BIOPSY/REMOVAL:  ?- LOW-GRADE SQUAMOUS INTRAEPITHELIAL LESION (CONDYLOMA).  ?- CANDIDIASIS.  ?- NEGATIVE FOR HIGH-GRADE SQUAMOUS INTRAEPITHELIAL LESION AND MALIGNANCY.  ?Comment: There are focal intraepithelial neutrophils, and PAS stain for fungi demonstrates a few intraepithelial pseudohyphae.  ? ?She was treated with Diflucan 150 mg daily X 3 days and used clobetasol topically for persistent symptoms.  ? ?She had her left ovary removed 05/2017 with Dr. HKenton Kingfisherd/t cyst and pain.  ? ?4/19 ?Pathology:  ?A.  RIGHT VULVA; BIOPSY:  ?- HIGH-GRADE SQUAMOUS INTRAEPITHELIAL LESION (VIN III).  ?- A PERIPHERAL BIOPSY EDGE IS INVOLVED. ? ?8/19 - normal exam and asymptomatic. ?  ?4/20 - telemedicine visit with no symptoms. ? ?7/20 visit had colposcopy with small 3 mm vulvar lesion involving the right labia minora. Biopsy showed only hyperparakeratosis. Treated with clobetasol and symptoms resolved. ? ?Stopped smoking. ? ?GYN History: ??  Gravida 3  ??  Para 2  ??  Age at Menarche 89 ??  Regular Pap Smears Yes  ??  History of HPV Infection  ??  Cycle Normal     ??  Additional Hx no other gyn problems in the past  ? ?  Problem List: ?Patient Active Problem List  ? Diagnosis Date Noted  ? DKA (diabetic ketoacidosis) ([place]) 05/22/2021  ? Metabolic acidosis   ? Renal insufficiency   ? Encephalopathy   ? Vulvar dermatitis 04/11/2019  ? Right shoulder pain 09/03/2018  ? Radiculopathy 04/18/2018  ? Left lower quadrant pain 05/22/2017  ? Ovarian cyst, complex 05/11/2017  ? Leukocytosis 05/19/2016  ? Erythrocytosis 05/19/2016  ? Neck pain on left side 05/19/2016  ? Vulvar intraepithelial neoplasia (VIN) grade 3 03/10/2015  ? Allergy to antibacterial drug 10/20/2014  ? Staphylococcal infection of skin 10/13/2014  ? Furuncle of vulva  10/13/2014  ? Hyperlipidemia due to type 1 diabetes mellitus ([place]) 09/12/2014  ? Encounter for preventive health examination 09/12/2014  ? Onychomycosis of toenail 09/10/2014  ? Type 1 diabetes mellitus with neurological manifestations, uncontrolled 02/10/2014  ? Chronic cough 02/05/2014  ? Carpal tunnel syndrome 12/17/2013  ? Chronic bronchitis ([place]) 11/27/2013  ? Unspecified constipation 11/11/2013  ? Left wrist pain 11/07/2013  ? Insomnia secondary to anxiety 07/09/2013  ? History of hyperthyroidism 07/01/2013  ? Degenerative TFCC tear 05/23/2013  ? Hyperthyroidism 04/28/2013  ? Right wrist pain 04/28/2013  ? Type I diabetes mellitus ([place]) 04/28/2013  ? Snoring disorder 12/26/2012  ? Tobacco abuse 05/22/2011  ? Tobacco abuse counseling 05/22/2011  ? ABNORMAL EKG 09/10/2009  ? Dyslipidemia 04/02/2006  ? ? ?Past Medical History: ?Past Medical History:  ?Diagnosis Date  ? Diabetes mellitus   ? Hay fever   ? Allergies  ? Headache   ? only from neck pain  ? Heart murmur   ? at birth   ? History of HPV infection   ? Hypercholesterolemia   ? Hypothyroidism   ? Melanoma of skin ([place]) 2010  ? In her vaginal area with surgical resection.   ? Myocardial infarction [facility]) 1997  ? At age 56  ? Ovarian cyst 05/09/2017  ? Radiculopathy of cervical region   ? B/L  ? Thyroid disease   ? Tobacco abuse   ? VIN III (vulvar intraepithelial neoplasia III)   ? Vulvar lesion   ? Wears glasses   ? ? ?Past Surgical History: ?Past Surgical History:  ?Procedure Laterality Date  ? ABDOMINAL HYSTERECTOMY  10/12/12  ? ANKLE SURGERY Right 2010  ? Fractued ankle  ? ANTERIOR CERVICAL DECOMPRESSION/DISCECTOMY FUSION 4 LEVELS N/A 04/18/2018  ? Procedure: ANTERIOR CERVICAL DECOMPRESSION FUSION. CERVICAL 4-5, CERVICAL 5-6, CERVICAL 6-7 WITH INSTRUMENTATION AND ALLOGRAFT.;  Surgeon: Phylliss Bob, MD;  Location: [place];  Service: Orthopedics;  Laterality: N/A;  ? BREAST BIOPSY Bilateral @ 2013  ? core with clips  ? FLEXIBLE BRONCHOSCOPY N/A 05/30/2017  ?  Procedure: FLEXIBLE BRONCHOSCOPY;  Surgeon: Laverle Hobby, MD;  Location: [facility];  Service: Pulmonary;  Laterality: N/A;  ? LAPAROSCOPIC SALPINGO OOPHERECTOMY Left 06/14/2017  ? Procedure: LAPAROSCOPIC SALPINGO OOPHORECTOMY;  Surgeon: Gae Dry, MD;  Location: [facility];  Service: Gynecology;  Laterality: Left;  ? SHOULDER SURGERY Right 09/2018  ? TUBAL LIGATION  2004  ? TUBAL LIGATION  2004, [facility]  ? VULVAR LESION REMOVAL  10/2009 & 05/2012  ? VULVAR LESION REMOVAL N/A 04/07/2015  ? Procedure: VULVAR LESION;  Surgeon: Gillis Ends, MD;  Location: [facility];  Service: Gynecology;  Laterality: N/A;  with J-Plasma blade  ? ?Family History: ?Family History  ?Problem Relation Age of Onset  ? Diabetes Father   ? Hypertension Mother   ? Hyperlipidemia Mother   ? Diabetes Mother   ? Aneurysm Brother   ?  Breast cancer Maternal Aunt 66  ?     lung and ovarian  ? ?Social History: ?Social History  ? ?Socioeconomic History  ? Marital status: Married  ?  Spouse name: Not on file  ? Number of children: Not on file  ? Years of education: Not on file  ? Highest education level: Not on file  ?Occupational History  ? Not on file  ?Tobacco Use  ? Smoking status: Former  ?  Packs/day: 0.50  ?  Years: 22.00  ?  Pack years: 11.00  ?  Types: Cigarettes  ?  Quit date: 11/06/2019  ?  Years since quitting: 2.0  ? Smokeless tobacco: Never  ?Vaping Use  ? Vaping Use: Never used  ?Substance and Sexual Activity  ? Alcohol use: Yes  ?  Alcohol/week: 0.0 standard drinks  ?  Comment: occasional beer  ? Drug use: No  ? Sexual activity: Yes  ?  Birth control/protection: Surgical  ?Other Topics Concern  ? Not on file  ?Social History Narrative  ? No regular exercise.  ? Lives with spouse.  ? ?Social Determinants of Health  ? ?Financial Resource Strain: Not on file  ?Food Insecurity: Not on file  ?Transportation Needs: Not on file  ?Physical Activity: Not on file  ?Stress: Not on file  ?Social Connections: Not on file  ?Intimate  Partner Violence: Not on file  ? ? ?Allergies: ?Allergies  ?Allergen Reactions  ? Januvia [Sitagliptin] Nausea And Vomiting  ? Sulfa Antibiotics   ? Chantix [Varenicline] Rash  ? Septra [Sulfamethoxazole-Tri

## 2021-12-14 ENCOUNTER — Other Ambulatory Visit: Payer: Self-pay | Admitting: Nurse Practitioner

## 2021-12-14 ENCOUNTER — Telehealth: Payer: Self-pay | Admitting: *Deleted

## 2021-12-14 MED ORDER — PREMARIN 0.625 MG/GM VA CREA: TOPICAL_CREAM | VAGINAL | 2 refills | Status: AC

## 2021-12-14 NOTE — Telephone Encounter (Signed)
Patient left message that Dr Fransisca Connors was to have sent in a cream for her a month ago, but never did and now she is asking for a call to discuss ?

## 2021-12-14 NOTE — Progress Notes (Signed)
Prescription for premarin cream sent to pharmacy.  ?

## 2021-12-26 DIAGNOSIS — E1165 Type 2 diabetes mellitus with hyperglycemia: Secondary | ICD-10-CM | POA: Diagnosis not present

## 2021-12-26 DIAGNOSIS — R5383 Other fatigue: Secondary | ICD-10-CM | POA: Diagnosis not present

## 2021-12-26 DIAGNOSIS — E039 Hypothyroidism, unspecified: Secondary | ICD-10-CM | POA: Diagnosis not present

## 2021-12-26 DIAGNOSIS — E785 Hyperlipidemia, unspecified: Secondary | ICD-10-CM | POA: Diagnosis not present

## 2021-12-29 DIAGNOSIS — E039 Hypothyroidism, unspecified: Secondary | ICD-10-CM | POA: Diagnosis not present

## 2021-12-29 DIAGNOSIS — E1065 Type 1 diabetes mellitus with hyperglycemia: Secondary | ICD-10-CM | POA: Diagnosis not present

## 2021-12-29 DIAGNOSIS — J449 Chronic obstructive pulmonary disease, unspecified: Secondary | ICD-10-CM | POA: Diagnosis not present

## 2021-12-29 DIAGNOSIS — R69 Illness, unspecified: Secondary | ICD-10-CM | POA: Diagnosis not present

## 2021-12-29 DIAGNOSIS — R221 Localized swelling, mass and lump, neck: Secondary | ICD-10-CM | POA: Diagnosis not present

## 2022-03-30 DIAGNOSIS — Z20822 Contact with and (suspected) exposure to covid-19: Secondary | ICD-10-CM | POA: Diagnosis not present

## 2022-04-24 ENCOUNTER — Other Ambulatory Visit: Payer: Self-pay | Admitting: Pediatrics

## 2022-04-24 DIAGNOSIS — Z1231 Encounter for screening mammogram for malignant neoplasm of breast: Secondary | ICD-10-CM

## 2022-04-27 ENCOUNTER — Ambulatory Visit
Admission: RE | Admit: 2022-04-27 | Discharge: 2022-04-27 | Disposition: A | Discharge status: Home / Self Care | Payer: 59 | Source: Ambulatory Visit | Attending: Nurse Practitioner | Admitting: Nurse Practitioner

## 2022-04-27 DIAGNOSIS — E890 Postprocedural hypothyroidism: Secondary | ICD-10-CM | POA: Diagnosis not present

## 2022-04-27 DIAGNOSIS — Z1231 Encounter for screening mammogram for malignant neoplasm of breast: Secondary | ICD-10-CM | POA: Insufficient documentation

## 2022-04-27 DIAGNOSIS — E1065 Type 1 diabetes mellitus with hyperglycemia: Secondary | ICD-10-CM | POA: Diagnosis not present

## 2022-04-27 DIAGNOSIS — E785 Hyperlipidemia, unspecified: Secondary | ICD-10-CM | POA: Diagnosis not present

## 2022-04-27 DIAGNOSIS — Z6824 Body mass index (BMI) 24.0-24.9, adult: Secondary | ICD-10-CM | POA: Diagnosis not present

## 2022-05-01 DIAGNOSIS — M79644 Pain in right finger(s): Secondary | ICD-10-CM | POA: Diagnosis not present

## 2022-05-10 ENCOUNTER — Inpatient Hospital Stay: Payer: 59

## 2022-05-24 ENCOUNTER — Ambulatory Visit: Payer: 59

## 2022-05-25 DIAGNOSIS — E109 Type 1 diabetes mellitus without complications: Secondary | ICD-10-CM | POA: Diagnosis not present

## 2022-05-26 DIAGNOSIS — E039 Hypothyroidism, unspecified: Secondary | ICD-10-CM | POA: Diagnosis not present

## 2022-05-26 DIAGNOSIS — L2089 Other atopic dermatitis: Secondary | ICD-10-CM | POA: Diagnosis not present

## 2022-05-26 DIAGNOSIS — E1165 Type 2 diabetes mellitus with hyperglycemia: Secondary | ICD-10-CM | POA: Diagnosis not present

## 2022-05-26 DIAGNOSIS — R69 Illness, unspecified: Secondary | ICD-10-CM | POA: Diagnosis not present

## 2022-06-07 ENCOUNTER — Inpatient Hospital Stay: Payer: 59 | Attending: Oncology

## 2022-06-27 DIAGNOSIS — E782 Mixed hyperlipidemia: Secondary | ICD-10-CM | POA: Diagnosis not present

## 2022-06-27 DIAGNOSIS — E1165 Type 2 diabetes mellitus with hyperglycemia: Secondary | ICD-10-CM | POA: Diagnosis not present

## 2022-06-27 DIAGNOSIS — E039 Hypothyroidism, unspecified: Secondary | ICD-10-CM | POA: Diagnosis not present

## 2022-06-27 DIAGNOSIS — I10 Essential (primary) hypertension: Secondary | ICD-10-CM | POA: Diagnosis not present

## 2022-07-03 DIAGNOSIS — E1165 Type 2 diabetes mellitus with hyperglycemia: Secondary | ICD-10-CM | POA: Diagnosis not present

## 2022-07-03 DIAGNOSIS — E782 Mixed hyperlipidemia: Secondary | ICD-10-CM | POA: Diagnosis not present

## 2022-07-03 DIAGNOSIS — E039 Hypothyroidism, unspecified: Secondary | ICD-10-CM | POA: Diagnosis not present

## 2022-07-03 DIAGNOSIS — E785 Hyperlipidemia, unspecified: Secondary | ICD-10-CM | POA: Diagnosis not present

## 2022-07-03 DIAGNOSIS — R69 Illness, unspecified: Secondary | ICD-10-CM | POA: Diagnosis not present

## 2022-07-03 DIAGNOSIS — E1065 Type 1 diabetes mellitus with hyperglycemia: Secondary | ICD-10-CM | POA: Diagnosis not present

## 2022-07-04 DIAGNOSIS — E109 Type 1 diabetes mellitus without complications: Secondary | ICD-10-CM | POA: Diagnosis not present

## 2022-08-04 DIAGNOSIS — E109 Type 1 diabetes mellitus without complications: Secondary | ICD-10-CM | POA: Diagnosis not present

## 2022-09-21 ENCOUNTER — Encounter: Payer: Self-pay | Admitting: Nurse Practitioner

## 2022-09-26 ENCOUNTER — Ambulatory Visit: Payer: Self-pay | Admitting: Nurse Practitioner

## 2022-10-02 ENCOUNTER — Ambulatory Visit: Payer: Self-pay | Admitting: Nurse Practitioner

## 2022-11-06 ENCOUNTER — Other Ambulatory Visit: Payer: Self-pay | Admitting: Nurse Practitioner

## 2022-12-18 ENCOUNTER — Other Ambulatory Visit: Payer: Self-pay | Admitting: Nurse Practitioner

## 2023-01-01 ENCOUNTER — Ambulatory Visit: Payer: Self-pay | Admitting: Nurse Practitioner

## 2024-04-21 ENCOUNTER — Other Ambulatory Visit: Payer: Self-pay | Admitting: Medical Genetics

## 2024-05-03 ENCOUNTER — Other Ambulatory Visit

## 2024-05-09 ENCOUNTER — Other Ambulatory Visit

## 2024-05-23 ENCOUNTER — Other Ambulatory Visit

## 2024-06-09 ENCOUNTER — Other Ambulatory Visit

## 2024-09-02 ENCOUNTER — Other Ambulatory Visit: Payer: Self-pay | Admitting: Medical Genetics

## 2024-09-02 DIAGNOSIS — Z006 Encounter for examination for normal comparison and control in clinical research program: Secondary | ICD-10-CM
